# Patient Record
Sex: Female | Born: 1960
Health system: Southern US, Community
[De-identification: ages and names within clinical notes are randomized; demographics above are authoritative.]

## PROBLEM LIST (undated history)

## (undated) DIAGNOSIS — G540 Brachial plexus disorders: Secondary | ICD-10-CM

## (undated) DIAGNOSIS — N189 Chronic kidney disease, unspecified: Secondary | ICD-10-CM

## (undated) DIAGNOSIS — I10 Essential (primary) hypertension: Secondary | ICD-10-CM

## (undated) DIAGNOSIS — E669 Obesity, unspecified: Secondary | ICD-10-CM

## (undated) DIAGNOSIS — R609 Edema, unspecified: Secondary | ICD-10-CM

## (undated) DIAGNOSIS — Z0000 Encounter for general adult medical examination without abnormal findings: Secondary | ICD-10-CM

## (undated) DIAGNOSIS — J209 Acute bronchitis, unspecified: Secondary | ICD-10-CM

## (undated) DIAGNOSIS — F32A Depression, unspecified: Secondary | ICD-10-CM

## (undated) DIAGNOSIS — Z124 Encounter for screening for malignant neoplasm of cervix: Secondary | ICD-10-CM

## (undated) DIAGNOSIS — T7840XA Allergy, unspecified, initial encounter: Secondary | ICD-10-CM

## (undated) DIAGNOSIS — R0683 Snoring: Secondary | ICD-10-CM

## (undated) DIAGNOSIS — D649 Anemia, unspecified: Secondary | ICD-10-CM

## (undated) DIAGNOSIS — F329 Major depressive disorder, single episode, unspecified: Secondary | ICD-10-CM

## (undated) HISTORY — DX: Edema, unspecified: R60.9

## (undated) HISTORY — PX: TUBAL LIGATION: SHX77

## (undated) HISTORY — DX: Allergy, unspecified, initial encounter: T78.40XA

## (undated) HISTORY — DX: Anemia, unspecified: D64.9

## (undated) HISTORY — DX: Brachial plexus disorders: G54.0

## (undated) HISTORY — DX: Chronic kidney disease, unspecified: N18.9

## (undated) HISTORY — DX: Encounter for screening for malignant neoplasm of cervix: Z12.4

## (undated) HISTORY — DX: Snoring: R06.83

## (undated) HISTORY — DX: Obesity, unspecified: E66.9

## (undated) HISTORY — DX: Morbid (severe) obesity due to excess calories: E66.01

## (undated) HISTORY — DX: Encounter for general adult medical examination without abnormal findings: Z00.00

## (undated) HISTORY — DX: Acute bronchitis, unspecified: J20.9

---

## 2008-10-04 ENCOUNTER — Emergency Department (HOSPITAL_COMMUNITY): Admission: EM | Admit: 2008-10-04 | Discharge: 2008-10-04 | Payer: Self-pay | Admitting: Family Medicine

## 2008-10-06 ENCOUNTER — Emergency Department (HOSPITAL_COMMUNITY): Admission: EM | Admit: 2008-10-06 | Discharge: 2008-10-06 | Payer: Self-pay | Admitting: Family Medicine

## 2009-09-26 HISTORY — PX: ENDOMETRIAL ABLATION: SHX621

## 2012-01-24 ENCOUNTER — Emergency Department (INDEPENDENT_AMBULATORY_CARE_PROVIDER_SITE_OTHER): Payer: 59

## 2012-01-24 ENCOUNTER — Encounter (HOSPITAL_BASED_OUTPATIENT_CLINIC_OR_DEPARTMENT_OTHER): Payer: Self-pay | Admitting: *Deleted

## 2012-01-24 ENCOUNTER — Inpatient Hospital Stay (HOSPITAL_BASED_OUTPATIENT_CLINIC_OR_DEPARTMENT_OTHER)
Admission: EM | Admit: 2012-01-24 | Discharge: 2012-01-26 | DRG: 189 | Disposition: A | Discharge status: Home / Self Care | Payer: 59 | Attending: Internal Medicine | Admitting: Internal Medicine

## 2012-01-24 DIAGNOSIS — E876 Hypokalemia: Secondary | ICD-10-CM | POA: Diagnosis present

## 2012-01-24 DIAGNOSIS — R6510 Systemic inflammatory response syndrome (SIRS) of non-infectious origin without acute organ dysfunction: Secondary | ICD-10-CM | POA: Diagnosis present

## 2012-01-24 DIAGNOSIS — J9601 Acute respiratory failure with hypoxia: Secondary | ICD-10-CM | POA: Diagnosis present

## 2012-01-24 DIAGNOSIS — R0602 Shortness of breath: Secondary | ICD-10-CM

## 2012-01-24 DIAGNOSIS — F32A Depression, unspecified: Secondary | ICD-10-CM | POA: Diagnosis present

## 2012-01-24 DIAGNOSIS — J209 Acute bronchitis, unspecified: Secondary | ICD-10-CM | POA: Diagnosis present

## 2012-01-24 DIAGNOSIS — I10 Essential (primary) hypertension: Secondary | ICD-10-CM | POA: Diagnosis present

## 2012-01-24 DIAGNOSIS — J45901 Unspecified asthma with (acute) exacerbation: Secondary | ICD-10-CM | POA: Diagnosis present

## 2012-01-24 DIAGNOSIS — F329 Major depressive disorder, single episode, unspecified: Secondary | ICD-10-CM | POA: Diagnosis present

## 2012-01-24 DIAGNOSIS — F3289 Other specified depressive episodes: Secondary | ICD-10-CM | POA: Diagnosis present

## 2012-01-24 DIAGNOSIS — I498 Other specified cardiac arrhythmias: Secondary | ICD-10-CM | POA: Diagnosis present

## 2012-01-24 DIAGNOSIS — Z930 Tracheostomy status: Secondary | ICD-10-CM

## 2012-01-24 DIAGNOSIS — J45902 Unspecified asthma with status asthmaticus: Secondary | ICD-10-CM

## 2012-01-24 DIAGNOSIS — J9600 Acute respiratory failure, unspecified whether with hypoxia or hypercapnia: Principal | ICD-10-CM | POA: Diagnosis present

## 2012-01-24 DIAGNOSIS — D72829 Elevated white blood cell count, unspecified: Secondary | ICD-10-CM | POA: Diagnosis present

## 2012-01-24 DIAGNOSIS — J45909 Unspecified asthma, uncomplicated: Secondary | ICD-10-CM | POA: Diagnosis present

## 2012-01-24 DIAGNOSIS — R000 Tachycardia, unspecified: Secondary | ICD-10-CM | POA: Diagnosis present

## 2012-01-24 HISTORY — DX: Essential (primary) hypertension: I10

## 2012-01-24 HISTORY — DX: Depression, unspecified: F32.A

## 2012-01-24 HISTORY — DX: Major depressive disorder, single episode, unspecified: F32.9

## 2012-01-24 LAB — CBC
HCT: 40.1 % (ref 36.0–46.0)
MCH: 29.6 pg (ref 26.0–34.0)
MCH: 29.8 pg (ref 26.0–34.0)
MCV: 87.4 fL (ref 78.0–100.0)
Platelets: 357 10*3/uL (ref 150–400)
RBC: 4.56 MIL/uL (ref 3.87–5.11)
RDW: 12.4 % (ref 11.5–15.5)
RDW: 12.9 % (ref 11.5–15.5)
WBC: 22.7 10*3/uL — ABNORMAL HIGH (ref 4.0–10.5)

## 2012-01-24 LAB — PRO B NATRIURETIC PEPTIDE: Pro B Natriuretic peptide (BNP): 145.2 pg/mL — ABNORMAL HIGH (ref 0–125)

## 2012-01-24 LAB — BASIC METABOLIC PANEL
BUN: 15 mg/dL (ref 6–23)
CO2: 24 mEq/L (ref 19–32)
Calcium: 8.9 mg/dL (ref 8.4–10.5)
Calcium: 9.3 mg/dL (ref 8.4–10.5)
Chloride: 99 mEq/L (ref 96–112)
Chloride: 99 mEq/L (ref 96–112)
Creatinine, Ser: 0.8 mg/dL (ref 0.50–1.10)
GFR calc Af Amer: >90 mL/min (ref >90)
GFR calc non Af Amer: 86 mL/min — ABNORMAL LOW (ref >90)
Glucose, Bld: 142 mg/dL — ABNORMAL HIGH (ref 70–99)
Sodium: 136 mEq/L (ref 135–145)

## 2012-01-24 LAB — COMPREHENSIVE METABOLIC PANEL
AST: 21 U/L (ref 0–37)
Albumin: 3.5 g/dL (ref 3.5–5.2)
Alkaline Phosphatase: 126 U/L — ABNORMAL HIGH (ref 39–117)
BUN: 15 mg/dL (ref 6–23)
Potassium: 2.5 mEq/L — CL (ref 3.5–5.1)
Sodium: 138 mEq/L (ref 135–145)
Total Protein: 7.5 g/dL (ref 6.0–8.3)

## 2012-01-24 LAB — POCT I-STAT 3, ART BLOOD GAS (G3+)
Acid-base deficit: 2 mmol/L (ref 0.0–2.0)
Bicarbonate: 22.9 mEq/L (ref 20.0–24.0)
O2 Saturation: 89 %
pCO2 arterial: 40 mmHg (ref 35.0–45.0)
pO2, Arterial: 59 mmHg — ABNORMAL LOW (ref 80.0–100.0)

## 2012-01-24 LAB — DIFFERENTIAL
Basophils Absolute: 0 10*3/uL (ref 0.0–0.1)
Eosinophils Absolute: 1.1 10*3/uL — ABNORMAL HIGH (ref 0.0–0.7)
Eosinophils Relative: 7 % — ABNORMAL HIGH (ref 0–5)
Monocytes Absolute: 0.8 10*3/uL (ref 0.1–1.0)
Neutrophils Relative %: 70 % (ref 43–77)

## 2012-01-24 LAB — GLUCOSE, CAPILLARY
Glucose-Capillary: 144 mg/dL — ABNORMAL HIGH (ref 70–99)
Glucose-Capillary: 138 mg/dL — ABNORMAL HIGH (ref 70–99)

## 2012-01-24 LAB — D-DIMER, QUANTITATIVE: D-Dimer, Quant: <0.22 ug/mL-FEU (ref 0.00–0.48)

## 2012-01-24 MED ORDER — ENOXAPARIN SODIUM 40 MG/0.4ML ~~LOC~~ SOLN
40.0000 mg | SUBCUTANEOUS | Status: DC
  Administered 2012-01-24 – 2012-01-26 (×3): 40 mg via SUBCUTANEOUS
  Filled 2012-01-24 (×3): qty 0.4

## 2012-01-24 MED ORDER — DEXTROSE 5 % IV SOLN
500.0000 mg | INTRAVENOUS | Status: DC
  Administered 2012-01-24 – 2012-01-26 (×3): 500 mg via INTRAVENOUS
  Filled 2012-01-24 (×4): qty 500

## 2012-01-24 MED ORDER — IPRATROPIUM BROMIDE 0.02 % IN SOLN
0.5000 mg | Freq: Once | RESPIRATORY_TRACT | Status: AC
  Administered 2012-01-24: 0.5 mg via RESPIRATORY_TRACT

## 2012-01-24 MED ORDER — ONDANSETRON HCL 4 MG PO TABS: 4.0000 mg | ORAL_TABLET | Freq: Four times a day (QID) | ORAL | Status: DC | PRN

## 2012-01-24 MED ORDER — SODIUM CHLORIDE 0.9 % IJ SOLN
3.0000 mL | Freq: Two times a day (BID) | INTRAMUSCULAR | Status: DC
  Administered 2012-01-26: 3 mL via INTRAVENOUS

## 2012-01-24 MED ORDER — ESCITALOPRAM OXALATE 20 MG PO TABS
20.0000 mg | ORAL_TABLET | Freq: Every day | ORAL | Status: DC
  Administered 2012-01-24 – 2012-01-26 (×3): 20 mg via ORAL
  Filled 2012-01-24 (×3): qty 1

## 2012-01-24 MED ORDER — ACETAMINOPHEN 650 MG RE SUPP: 650.0000 mg | Freq: Four times a day (QID) | RECTAL | Status: DC | PRN

## 2012-01-24 MED ORDER — POTASSIUM CHLORIDE CRYS ER 20 MEQ PO TBCR
40.0000 meq | EXTENDED_RELEASE_TABLET | Freq: Two times a day (BID) | ORAL | Status: AC
  Administered 2012-01-24 (×2): 40 meq via ORAL
  Filled 2012-01-24 (×2): qty 2

## 2012-01-24 MED ORDER — ONDANSETRON HCL 4 MG/2ML IJ SOLN: 4.0000 mg | Freq: Four times a day (QID) | INTRAMUSCULAR | Status: DC | PRN

## 2012-01-24 MED ORDER — ALBUTEROL SULFATE (5 MG/ML) 0.5% IN NEBU
INHALATION_SOLUTION | RESPIRATORY_TRACT | Status: AC
  Filled 2012-01-24: qty 1

## 2012-01-24 MED ORDER — TRIAMTERENE-HCTZ 37.5-25 MG PO CAPS
1.0000 | ORAL_CAPSULE | ORAL | Status: DC
  Administered 2012-01-24 – 2012-01-26 (×3): 1 via ORAL
  Filled 2012-01-24 (×5): qty 1

## 2012-01-24 MED ORDER — BIOTENE DRY MOUTH MT LIQD
15.0000 mL | Freq: Two times a day (BID) | OROMUCOSAL | Status: DC
  Administered 2012-01-24 – 2012-01-26 (×4): 15 mL via OROMUCOSAL

## 2012-01-24 MED ORDER — ALBUTEROL SULFATE (5 MG/ML) 0.5% IN NEBU
INHALATION_SOLUTION | RESPIRATORY_TRACT | Status: AC
  Filled 2012-01-24: qty 0.5

## 2012-01-24 MED ORDER — SODIUM CHLORIDE 0.9 % IJ SOLN
3.0000 mL | Freq: Two times a day (BID) | INTRAMUSCULAR | Status: DC
  Administered 2012-01-24 – 2012-01-26 (×4): 3 mL via INTRAVENOUS

## 2012-01-24 MED ORDER — BUDESONIDE 0.5 MG/2ML IN SUSP
0.5000 mg | Freq: Two times a day (BID) | RESPIRATORY_TRACT | Status: DC
  Administered 2012-01-24 – 2012-01-25 (×2): 0.5 mg via RESPIRATORY_TRACT
  Filled 2012-01-24 (×5): qty 2

## 2012-01-24 MED ORDER — LEVALBUTEROL HCL 0.63 MG/3ML IN NEBU
0.6300 mg | INHALATION_SOLUTION | Freq: Four times a day (QID) | RESPIRATORY_TRACT | Status: DC | PRN
  Filled 2012-01-24: qty 3

## 2012-01-24 MED ORDER — METHYLPREDNISOLONE SODIUM SUCC 125 MG IJ SOLR
60.0000 mg | Freq: Four times a day (QID) | INTRAMUSCULAR | Status: DC
  Administered 2012-01-24 (×3): 60 mg via INTRAVENOUS
  Administered 2012-01-25: 07:00:00 via INTRAVENOUS
  Administered 2012-01-25: 60 mg via INTRAVENOUS
  Filled 2012-01-24: qty 0.96
  Filled 2012-01-24: qty 2
  Filled 2012-01-24 (×3): qty 0.96
  Filled 2012-01-24: qty 2
  Filled 2012-01-24: qty 0.96

## 2012-01-24 MED ORDER — ALBUTEROL SULFATE (5 MG/ML) 0.5% IN NEBU
5.0000 mg | INHALATION_SOLUTION | Freq: Once | RESPIRATORY_TRACT | Status: AC
  Administered 2012-01-24: 5 mg via RESPIRATORY_TRACT

## 2012-01-24 MED ORDER — ALBUTEROL (5 MG/ML) CONTINUOUS INHALATION SOLN
10.0000 mg/h | INHALATION_SOLUTION | RESPIRATORY_TRACT | Status: AC
  Administered 2012-01-24: 10 mg/h via RESPIRATORY_TRACT
  Filled 2012-01-24: qty 20

## 2012-01-24 MED ORDER — LEVALBUTEROL HCL 0.63 MG/3ML IN NEBU
0.6300 mg | INHALATION_SOLUTION | Freq: Four times a day (QID) | RESPIRATORY_TRACT | Status: DC
  Administered 2012-01-24 – 2012-01-26 (×9): 0.63 mg via RESPIRATORY_TRACT
  Filled 2012-01-24 (×15): qty 3

## 2012-01-24 MED ORDER — LORATADINE 10 MG PO TABS
10.0000 mg | ORAL_TABLET | Freq: Every day | ORAL | Status: DC
  Administered 2012-01-24 – 2012-01-26 (×3): 10 mg via ORAL
  Filled 2012-01-24 (×3): qty 1

## 2012-01-24 MED ORDER — IPRATROPIUM BROMIDE 0.02 % IN SOLN
RESPIRATORY_TRACT | Status: AC
  Administered 2012-01-24: 0.5 mg via RESPIRATORY_TRACT
  Filled 2012-01-24: qty 2.5

## 2012-01-24 MED ORDER — PANTOPRAZOLE SODIUM 40 MG IV SOLR
40.0000 mg | INTRAVENOUS | Status: DC
  Administered 2012-01-24: 40 mg via INTRAVENOUS
  Filled 2012-01-24 (×2): qty 40

## 2012-01-24 MED ORDER — IPRATROPIUM BROMIDE 0.02 % IN SOLN
0.5000 mg | Freq: Once | RESPIRATORY_TRACT | Status: AC
  Administered 2012-01-24: 0.5 mg via RESPIRATORY_TRACT
  Filled 2012-01-24: qty 2.5

## 2012-01-24 MED ORDER — PREDNISONE 50 MG PO TABS
60.0000 mg | ORAL_TABLET | Freq: Once | ORAL | Status: AC
  Administered 2012-01-24: 60 mg via ORAL
  Filled 2012-01-24: qty 1

## 2012-01-24 MED ORDER — INSULIN ASPART 100 UNIT/ML ~~LOC~~ SOLN
0.0000 [IU] | SUBCUTANEOUS | Status: DC
  Administered 2012-01-24: 1 [IU] via SUBCUTANEOUS
  Administered 2012-01-24: 2 [IU] via SUBCUTANEOUS

## 2012-01-24 MED ORDER — ALBUTEROL SULFATE (5 MG/ML) 0.5% IN NEBU
5.0000 mg | INHALATION_SOLUTION | Freq: Once | RESPIRATORY_TRACT | Status: AC
  Administered 2012-01-24: 5 mg via RESPIRATORY_TRACT
  Filled 2012-01-24: qty 1

## 2012-01-24 MED ORDER — INSULIN ASPART 100 UNIT/ML ~~LOC~~ SOLN
0.0000 [IU] | Freq: Three times a day (TID) | SUBCUTANEOUS | Status: DC
  Administered 2012-01-25 (×3): 1 [IU] via SUBCUTANEOUS
  Administered 2012-01-26: 2 [IU] via SUBCUTANEOUS

## 2012-01-24 MED ORDER — ACETAMINOPHEN 325 MG PO TABS: 650.0000 mg | ORAL_TABLET | Freq: Four times a day (QID) | ORAL | Status: DC | PRN

## 2012-01-24 MED ORDER — IBUPROFEN 800 MG PO TABS
800.0000 mg | ORAL_TABLET | Freq: Once | ORAL | Status: AC
  Administered 2012-01-24: 800 mg via ORAL
  Filled 2012-01-24: qty 1

## 2012-01-24 NOTE — Progress Notes (Signed)
Utilization Review Completed.Kielan Dreisbach T4/30/2013   

## 2012-01-24 NOTE — ED Notes (Signed)
C/o SOB for few days with cough, audible wheezing in triage

## 2012-01-24 NOTE — ED Provider Notes (Signed)
History     CSN: [id number]  Arrival date & time 01/24/12  0214   First MD Initiated Contact with Patient 01/24/12 [phone]      Chief Complaint  Patient presents with  . Shortness of Breath     Patient is a 51 y.o. female presenting with shortness of breath. The history is provided by the patient. The history is limited by the condition of the patient.  Shortness of Breath  The current episode started 2 days ago. The problem occurs continuously. The problem has been rapidly worsening. The problem is severe. The symptoms are relieved by nothing. The symptoms are aggravated by activity. Associated symptoms include cough, shortness of breath and wheezing. Pertinent negatives include no chest pain.  Pt reports cough/wheeze shortness of breath for about 2 days She does not smoke She has been told previously that she has bronchitis  Past Medical History  Diagnosis Date  . Asthma   . Hypertension      Family History - negative for asthma, positive for CAD  History  Substance Use Topics  . Smoking status: Never Smoker   . Smokeless tobacco: Not on file  . Alcohol Use: No    OB History    Grav Para Term Preterm Abortions TAB SAB Ect Mult Living                  Review of Systems  Unable to perform ROS: Unstable vital signs  Respiratory: Positive for cough, shortness of breath and wheezing.   Cardiovascular: Negative for chest pain.    Allergies  Review of patient's allergies indicates not on file.  Home Medications  No current outpatient prescriptions on file.  BP 165/65  Pulse 114  Temp(Src) 97.7 F (36.5 C) (Oral)  Resp 28  SpO2 93%  Physical Exam CONSTITUTIONAL: ill appearing, she is in distress HEAD AND FACE: Normocephalic/atraumatic EYES: EOMI/PERRL ENMT: Mucous membranes moist NECK: supple no meningeal signs SPINE:entire spine nontender CV: S1/S2 noted, no murmurs/rubs/gallops noted LUNGS: tachypneic, she is only able to speak in short sentences,  diffuse wheezing noted bilaterally ABDOMEN: soft, nontender, no rebound or guarding, obese GU:no cva tenderness NEURO: Pt is awake/alert, moves all extremitiesx4 EXTREMITIES: pulses normal, full ROM, no edema SKIN: warm, color normal PSYCH: anxious ED Course  Procedures   CRITICAL CARE Performed by: Joya Gaskins   Total critical care time: 45  Critical care time was exclusive of separately billable procedures and treating other patients.  Critical care was necessary to treat or prevent imminent or life-threatening deterioration.  Critical care was time spent personally by me on the following activities: development of treatment plan with patient and/or surrogate as well as nursing, discussions with consultants, evaluation of patient's response to treatment, examination of patient, obtaining history from patient or surrogate, ordering and performing treatments and interventions, ordering and review of laboratory studies, ordering and review of radiographic studies, pulse oximetry and re-evaluation of patient's condition.    Labs Reviewed  CBC  DIFFERENTIAL  BASIC METABOLIC PANEL  2:29 AM Pt seen on arrival for SOB - she is in distress, hypoxic to the 80s, oxygen applied and given nebulizers Her hypoxia has improved Will follow closely 2:41 AM Her work of breathing has improved, her hypoxia has improved Will continue to follow 2:58 AM Pt improved She reports several days of cough/wheeze/shortness of breath No cp, no syncope.  She denies h/o CAD/CHF She reports once or twice/yr she will have cough/wheeze episodes but this is the worst she has  had She reports home albuterol inhaler did not help symptoms Will continue to monitor 3:23 AM RA pulse ox >90, pt much improved but still wheezing Will place on CDU protocol for bronchospasm (pt reports h/o similar episodes previously but this is worse, doubt PE/ACS at this time) 4:42 AM Pt not significantly improved Her peak  flow is very low She is awake/alert, conversant but still SOB Will need admission 4:56 AM D/w dr Toniann Fail will admit to stepdown at [facility] 5:09 AM Pt more comfortable, reports she feels improved, her work of breathing has improved 5:52 AM ABG reviewed Pt awake/alert, appears comfortable, reports she is improved, tachycardic (130s) though appears narrow likely response to nebs.  Stable for transport to stepdown   MDM  Nursing notes reviewed and considered in documentation xrays reviewed and considered  labs/vitals reviewed and considered        Date: 01/24/2012  Rate: 116  Rhythm: sinus tachycardia  QRS Axis: normal  Intervals: normal  ST/T Wave abnormalities: nonspecific ST changes  Conduction Disutrbances:none  Narrative Interpretation:   Old EKG Reviewed: none available at time of interpretation    Joya Gaskins, MD 01/24/12 [phone]

## 2012-01-24 NOTE — ED Notes (Addendum)
Pt states that she has bronchitis but no hx of asthma and is morbidly obese. Pt presented to the ED with [facility] which she states she used her MDI Albuterol 2 puff prior to arrival. Pt is diminished bilaterally with some upper airway wheezing in her neck.

## 2012-01-24 NOTE — Plan of Care (Signed)
Problem: Phase II Progression Outcomes Goal: Dyspnea controlled w/progressive activity Outcome: Progressing Pt tolerating out of bed to bedside commode

## 2012-01-24 NOTE — Progress Notes (Signed)
[facility]   Subjective: She was evaluated in the AM. She was still c/o of dyspnea but much improved. Her HR is noted to be in 130s - she tells me she had a 30 min neb in addition to 3 other neb treatments. She recently had a bronchitis and was treated with antibiotics. She thought she had improved but after the nebs were given, she began to cough up green sputum again.   Objective: Blood pressure 129/52, pulse 108, temperature 98 F (36.7 C), temperature source Oral, resp. rate 15, SpO2 95.00%. Weight change:   Intake/Output Summary (Last 24 hours) at 01/24/12 1628 Last data filed at 01/24/12 1555  Gross per 24 hour  Intake    980 ml  Output    525 ml  Net    455 ml    Physical Exam: General appearance: morbidly obese, alert, cooperative and mild distress Lungs: tachypneic, wheezing and ronchi present.  Heart: regular rate and rhythm, S1, S2 normal, tachycardic Abdomen: soft, non-tender; bowel sounds normal; no masses,  no organomegaly Extremities: extremities normal, atraumatic, no cyanosis or edema  Lab Results:  [facility] 01/24/12 0829 01/24/12 0243  NA 138 137  K 2.5* 3.4*  CL 96 99  CO2 19 27  GLUCOSE 160* 113*  BUN 15 15  CREATININE 0.77 0.80  CALCIUM 8.8 8.9  MG -- --  PHOS -- --    Basename 01/24/12 0829  AST 21  ALT 23  ALKPHOS 126*  BILITOT 0.3  PROT 7.5  ALBUMIN 3.5   No results found for this basename: LIPASE:2,AMYLASE:2 in the last 72 hours  Basename 01/24/12 0829 01/24/12 0243  WBC 22.7* 15.4*  NEUTROABS -- 10.7*  HGB 13.7 13.5  HCT 40.1 39.5  MCV 87.4 86.6  PLT 403* 357   No results found for this basename: CKTOTAL:3,CKMB:3,CKMBINDEX:3,TROPONINI:3 in the last 72 hours No components found with this basename: POCBNP:3  Basename 01/24/12 0829  DDIMER <0.22   No results found for this basename: HGBA1C:2 in the last 72 hours No results found for this basename: CHOL:2,HDL:2,LDLCALC:2,TRIG:2,CHOLHDL:2,LDLDIRECT:2 in the last 72  hours No results found for this basename: TSH,T4TOTAL,FREET3,T3FREE,THYROIDAB in the last 72 hours No results found for this basename: VITAMINB12:2,FOLATE:2,FERRITIN:2,TIBC:2,IRON:2,RETICCTPCT:2 in the last 72 hours  Micro Results: Recent Results (from the past 240 hour(s))  MRSA PCR SCREENING     Status: Normal   Collection Time   01/24/12  6:43 AM      Component Value Range Status Comment   MRSA by PCR NEGATIVE  NEGATIVE  Final     Studies/Results: Dg Chest Port 1 View  01/24/2012  *RADIOLOGY REPORT*  Clinical Data: Shortness of breath  PORTABLE CHEST - 1 VIEW  Comparison: None.  Findings: Degraded by patient body habitus/portable technique.  No focal consolidation.  No pleural effusion or pneumothorax. Cardiomediastinal contours within normal limits.  No acute osseous finding identified.  IMPRESSION: No definite acute process.  Original Report Authenticated By: Waneta Martins, M.D.    Medications: Scheduled Meds:   . albuterol  5 mg Nebulization Once  . albuterol  5 mg Nebulization Once  . albuterol  5 mg Nebulization Once  . albuterol      . antiseptic oral rinse  15 mL Mouth Rinse BID  . azithromycin  500 mg Intravenous Q24H  . budesonide  0.5 mg Nebulization BID  . enoxaparin  40 mg Subcutaneous Q24H  . escitalopram  20 mg Oral Daily  . ibuprofen  800 mg Oral Once  . insulin  aspart  0-9 Units Subcutaneous Q4H  . ipratropium  0.5 mg Nebulization Once  . ipratropium  0.5 mg Nebulization Once  . levalbuterol  0.63 mg Nebulization Q6H  . loratadine  10 mg Oral Daily  . methylPREDNISolone (SOLU-MEDROL) injection  60 mg Intravenous Q6H  . pantoprazole (PROTONIX) IV  40 mg Intravenous Q24H  . potassium chloride  40 mEq Oral BID  . predniSONE  60 mg Oral Once  . sodium chloride  3 mL Intravenous Q12H  . sodium chloride  3 mL Intravenous Q12H  . triamterene-hydrochlorothiazide  1 each Oral BH-q7a   Continuous Infusions:   . albuterol 10 mg/hr (01/24/12 0407)   PRN  Meds:.acetaminophen, acetaminophen, levalbuterol, ondansetron (ZOFRAN) IV, ondansetron, DISCONTD: ondansetron  Assessment/Plan: Principal Problem:  Acute respiratory failure - agree with antibiotics, steroids, nebs and step down status.   Active Problems:  HTN (hypertension) Controlled   Depression Cont escitalopram   LOS: 0 days   [facility] [place] [phone] 01/24/2012, 4:28 PM

## 2012-01-24 NOTE — ED Notes (Signed)
Report given to RN Tammy, carelink given report, pt informed of room number and cooperative to transfer, pt currently resting with no needs at this time.

## 2012-01-24 NOTE — ED Notes (Signed)
Patient denies pain and is resting comfortably.  

## 2012-01-24 NOTE — H&P (Signed)
Pamela Solis is an 51 y.o. female.   PCP - [facility]. Chief Complaint: Shortness of breath. HPI: 51 year-old female with history of hypertension, allergic rhinitis, depression presented to the ER at [facility] with complaints of persistent wheezing and shortness of breath. Patient states she's been feeling short of breath lasting for 4 days and over the last 24 hours got worse and she decided to come to the ER. In the ER chest x-ray did not show anything acute. Patient was found to be wheezing bilaterally and patient was given nebulizer. Despite giving multiple doses patient was still wheezing. The patient has been admitted for further management of her asthma. Patient states she has never been diagnosed with asthma previous. She has had recent bronchitis in the beginning of this month when she was prescribed albuterol. She takes that whenever needed. She denies any chest pain cough fever chills nausea vomiting abdominal pain.  Past Medical History  Diagnosis Date  . Asthma   . Hypertension   . Depression     Past Surgical History  Procedure Date  . Cesarean section     History reviewed. No pertinent family history. Social History:  reports that she has never smoked. She does not have any smokeless tobacco history on file. She reports that she does not drink alcohol or use illicit drugs.  Allergies:  Allergies  Allergen Reactions  . Penicillins     Medications Prior to Admission  Medication Sig Dispense Refill  . albuterol (PROVENTIL HFA;VENTOLIN HFA) 108 (90 BASE) MCG/ACT inhaler Inhale 2 puffs into the lungs every 6 (six) hours as needed.      . cetirizine (ZYRTEC) 10 MG tablet Take 10 mg by mouth daily.      Marland Kitchen escitalopram (LEXAPRO) 20 MG tablet Take 20 mg by mouth daily.      Marland Kitchen triamterene-hydrochlorothiazide (DYAZIDE) 37.5-25 MG per capsule Take 1 capsule by mouth every morning.        Results for orders placed during the hospital encounter of  01/24/12 (from the past 48 hour(s))  CBC     Status: Abnormal   Collection Time   01/24/12  2:43 AM      Component Value Range Comment   WBC 15.4 (*) 4.0 - 10.5 (K/uL)    RBC 4.56  3.87 - 5.11 (MIL/uL)    Hemoglobin 13.5  12.0 - 15.0 (g/dL)    HCT 16.1  09.6 - 04.5 (%)    MCV 86.6  78.0 - 100.0 (fL)    MCH 29.6  26.0 - 34.0 (pg)    MCHC 34.2  30.0 - 36.0 (g/dL)    RDW 40.9  81.1 - 91.4 (%)    Platelets 357  150 - 400 (K/uL)   DIFFERENTIAL     Status: Abnormal   Collection Time   01/24/12  2:43 AM      Component Value Range Comment   Neutrophils Relative 70  43 - 77 (%)    Lymphocytes Relative 18  12 - 46 (%)    Monocytes Relative 5  3 - 12 (%)    Eosinophils Relative 7 (*) 0 - 5 (%)    Basophils Relative 0  0 - 1 (%)    Neutro Abs 10.7 (*) 1.7 - 7.7 (K/uL)    Lymphs Abs 2.8  0.7 - 4.0 (K/uL)    Monocytes Absolute 0.8  0.1 - 1.0 (K/uL)    Eosinophils Absolute 1.1 (*) 0.0 - 0.7 (K/uL)  Basophils Absolute 0.0  0.0 - 0.1 (K/uL)    WBC Morphology WHITE COUNT CONFIRMED ON SMEAR      Smear Review PLATELET COUNT CONFIRMED BY SMEAR   MORPHOLOGY UNREMARKABLE  BASIC METABOLIC PANEL     Status: Abnormal   Collection Time   01/24/12  2:43 AM      Component Value Range Comment   Sodium 137  135 - 145 (mEq/L)    Potassium 3.4 (*) 3.5 - 5.1 (mEq/L)    Chloride 99  96 - 112 (mEq/L)    CO2 27  19 - 32 (mEq/L)    Glucose, Bld 113 (*) 70 - 99 (mg/dL)    BUN 15  6 - 23 (mg/dL)    Creatinine, Ser 1.47  0.50 - 1.10 (mg/dL)    Calcium 8.9  8.4 - 10.5 (mg/dL)    GFR calc non Af Amer 86 (*) >90 (mL/min)    GFR calc Af Amer >90  >90 (mL/min)   POCT I-STAT 3, BLOOD GAS (G3+)     Status: Abnormal   Collection Time   01/24/12  5:38 AM      Component Value Range Comment   pH, Arterial 7.367  7.350 - 7.400     pCO2 arterial 40.0  35.0 - 45.0 (mmHg)    pO2, Arterial 59.0 (*) 80.0 - 100.0 (mmHg)    Bicarbonate 22.9  20.0 - 24.0 (mEq/L)    TCO2 24  0 - 100 (mmol/L)    O2 Saturation 89.0       Acid-base deficit 2.0  0.0 - 2.0 (mmol/L)    Patient temperature 98.7 F      Collection site RADIAL, ALLEN'S TEST ACCEPTABLE      Drawn by RT      Sample type ARTERIAL      Dg Chest Port 1 View  01/24/2012  *RADIOLOGY REPORT*  Clinical Data: Shortness of breath  PORTABLE CHEST - 1 VIEW  Comparison: None.  Findings: Degraded by patient body habitus/portable technique.  No focal consolidation.  No pleural effusion or pneumothorax. Cardiomediastinal contours within normal limits.  No acute osseous finding identified.  IMPRESSION: No definite acute process.  Original Report Authenticated By: Waneta Martins, M.D.    Review of Systems  Constitutional: Negative.   HENT: Negative.   Eyes: Negative.   Respiratory: Positive for cough and shortness of breath.   Cardiovascular: Negative.   Gastrointestinal: Negative.   Genitourinary: Negative.   Musculoskeletal: Negative.   Skin: Negative.   Neurological: Negative.   Endo/Heme/Allergies: Negative.   Psychiatric/Behavioral: Negative.     Blood pressure 164/71, pulse 148, temperature 98 F (36.7 C), temperature source Oral, resp. rate 22, SpO2 100.00%. Physical Exam  Constitutional: She is oriented to person, place, and time. She appears well-developed and well-nourished. No distress.  HENT:  Head: Normocephalic and atraumatic.  Right Ear: External ear normal.  Left Ear: External ear normal.  Nose: Nose normal.  Mouth/Throat: Oropharynx is clear and moist. No oropharyngeal exudate.  Eyes: Conjunctivae are normal. Pupils are equal, round, and reactive to light. Right eye exhibits no discharge. Left eye exhibits no discharge. No scleral icterus.  Neck: Normal range of motion. Neck supple.  Cardiovascular:       Sinus tachycardia.  Respiratory: Effort normal. She has wheezes. She has no rales.       Mild respiratory distress.  GI: Soft. Bowel sounds are normal. She exhibits no distension. There is no tenderness. There is no rebound.    Musculoskeletal:  Normal range of motion. She exhibits no edema and no tenderness.  Neurological: She is alert and oriented to person, place, and time.       Moves all extremities.  Skin: Skin is warm and dry. No rash noted. She is not diaphoretic. No erythema.  Psychiatric: Her behavior is normal.     Assessment/Plan #1. Acute exacerbation of bronchial asthma - continue nebulizer. Will place patient on Xopenex due to significant tachycardia. Add Pulmicort and Solu-Medrol IV. Check BNP and d-dimer. #2. History of hypertension - continue her present medications. #3. History of depression - continue this medication.  CODE STATUS - full code.  Jameire Kouba N. 01/24/2012, 7:14 AM

## 2012-01-24 NOTE — Plan of Care (Signed)
Problem: Phase I Progression Outcomes Goal: Progress activity as tolerated unless otherwise ordered Outcome: Progressing Pt out of bed to bedside commode, tolerating well

## 2012-01-24 NOTE — ED Notes (Signed)
Pt was given a third HHN tx then pt attempted to try the peak flow measurements before the tx which she truly wasn't able to give good effort due to some SHOB. Pt was then tried post HHN tx which she couldn't perform well with adequeate measurements due to productive cough and tachypnea.

## 2012-01-24 NOTE — ED Notes (Signed)
meds given by Helmut Muster, RN

## 2012-01-24 NOTE — ED Notes (Signed)
Pt placed on monitor, HR 140-150, pt asymptomatic, rate and rhythm regular, bp 149/74, md aware

## 2012-01-24 NOTE — Progress Notes (Addendum)
Potassium 2.5 from 01/24/12 0839 lab results. MD paged, awaiting orders.

## 2012-01-25 DIAGNOSIS — J45901 Unspecified asthma with (acute) exacerbation: Secondary | ICD-10-CM

## 2012-01-25 DIAGNOSIS — F329 Major depressive disorder, single episode, unspecified: Secondary | ICD-10-CM

## 2012-01-25 DIAGNOSIS — I10 Essential (primary) hypertension: Secondary | ICD-10-CM

## 2012-01-25 DIAGNOSIS — R7989 Other specified abnormal findings of blood chemistry: Secondary | ICD-10-CM

## 2012-01-25 LAB — GLUCOSE, CAPILLARY
Glucose-Capillary: 145 mg/dL — ABNORMAL HIGH (ref 70–99)
Glucose-Capillary: 121 mg/dL — ABNORMAL HIGH (ref 70–99)
Glucose-Capillary: 119 mg/dL — ABNORMAL HIGH (ref 70–99)

## 2012-01-25 MED ORDER — METHYLPREDNISOLONE SODIUM SUCC 125 MG IJ SOLR
60.0000 mg | Freq: Two times a day (BID) | INTRAMUSCULAR | Status: DC
  Administered 2012-01-25: 60 mg via INTRAVENOUS
  Filled 2012-01-25 (×2): qty 0.96

## 2012-01-25 MED ORDER — PANTOPRAZOLE SODIUM 40 MG PO TBEC: 40.0000 mg | DELAYED_RELEASE_TABLET | Freq: Every day | ORAL | Status: DC

## 2012-01-25 NOTE — Progress Notes (Signed)
Pt ambulated to BR to take a shower. After pt showered she became SOB while drying off and putting on her clothes. Pt returned to chair and O2 sats were obtained. Pt was 93-94% on RA, HR 90s-100s (SR-ST). Pt placed on 1L o2 via [place] to help with the feeling of SOB. Pt sats increased to 95-96%. Will continue to monitor patient closely. Ramond Craver, Rn

## 2012-01-25 NOTE — Progress Notes (Signed)
MEDICATION RELATED CONSULT NOTE - INITIAL   PHARMACIST - PHYSICIAN COMMUNICATION  CONCERNING: IV to Oral Route Change Policy  RECOMMENDATION:  This patient is receiving Protonix by the intravenous route. Based on criteria approved by the Pharmacy and Therapeutics Committee, this drug is being converted to the equivalent oral dose form(s).   DESCRIPTION:  These criteria include:  The patient is eating (either orally or via tube) and/or has been taking other orally administered medications for a least 24 hours There is no active GI bleed or impaired GI absorption noted.  If you have questions about this conversion, please contact the Pharmacy Department   Cleon Dew, PharmD [phone] 01/25/2012, 1:51 PM

## 2012-01-25 NOTE — Progress Notes (Signed)
Pt ambulated the whole length of hallway and back to room (712ft). Pt sats remained 93-96% on RA. HR remained upper 90s to 1teens. Pt denies SOB with ambulation. Will continue to monitor patient closely. Ramond Craver, RN

## 2012-01-25 NOTE — Progress Notes (Signed)
[facility] [place] TEAM 1 - Stepdown/ICU TEAM  Subjective: 51 year-old female with history of hypertension, allergic rhinitis, depression presented to the ER at [facility] with complaints of persistent wheezing and shortness of breath.  No further audible wheezing - denies current shortness of breath or chest pain. Has not yet ambulated.  Objective: Blood pressure 111/66, pulse 86, temperature 97.7 F (36.5 C), temperature source Oral, resp. rate 18, height 5\' 6"  (1.676 m), weight 133.8 kg (294 lb 15.6 oz), SpO2 90.00%. Weight change:   Intake/Output Summary (Last 24 hours) at 01/25/12 1130 Last data filed at 01/25/12 1044  Gross per 24 hour  Intake   1093 ml  Output   1000 ml  Net     93 ml   Physical Exam: General appearance: obese, alert, cooperative and mild distress Lungs: Bilateral lung sounds are clear to auscultation no further wheezing Heart: regular rate and rhythm, S1, S2 normal, tachycardic Abdomen: soft, non-tender; bowel sounds normal; no masses,  no organomegaly Extremities: extremities normal, atraumatic, no cyanosis or edema  Lab Results:  [facility] 01/24/12 2146 01/24/12 0829  NA 136 138  K 3.4* 2.5*  CL 99 96  CO2 24 19  GLUCOSE 142* 160*  BUN 13 15  CREATININE 0.95 0.77  CALCIUM 9.3 8.8  MG 2.0 --  PHOS -- --    Basename 01/24/12 0829  AST 21  ALT 23  ALKPHOS 126*  BILITOT 0.3  PROT 7.5  ALBUMIN 3.5    Basename 01/24/12 0829 01/24/12 0243  WBC 22.7* 15.4*  NEUTROABS -- 10.7*  HGB 13.7 13.5  HCT 40.1 39.5  MCV 87.4 86.6  PLT 403* 357    Basename 01/24/12 0829  DDIMER <0.22   Micro Results: Recent Results (from the past 240 hour(s))  MRSA PCR SCREENING     Status: Normal   Collection Time   01/24/12  6:43 AM      Component Value Range Status Comment   MRSA by PCR NEGATIVE  NEGATIVE  Final     Studies/Results: Dg Chest Port 1 View  01/24/2012  *RADIOLOGY REPORT*  Clinical Data: Shortness of breath  PORTABLE  CHEST - 1 VIEW  Comparison: None.  Findings: Degraded by patient body habitus/portable technique.  No focal consolidation.  No pleural effusion or pneumothorax. Cardiomediastinal contours within normal limits.  No acute osseous finding identified.  IMPRESSION: No definite acute process.  Original Report Authenticated By: Waneta Martins, M.D.   Assessment/Plan:  Acute respiratory failure secondary to bronchitis and possible new diagnosis of asthma *Much more stable today as compared to yesterday *Will taper steroids, continue scheduled Xopenex nebs and likely will transition to meter dose inhalers on 01/26/2012, with plan to continue on schedule for 14 days after d/c, then transition to prn only with preventative steroid MDI once off oral steroids *Since on systemic steroids no indication at this time for Pulmicort nebs *Plan on mobilizing and monitor for recurrent symptoms  HTN (hypertension) *Controlled  Depression *Cont escitalopram  Hyperglycemia Due to systemic steroids - will need to be followed up in outpt in outpt setting to assure resolves  Disposition *Transfer to telemetry unit *Likely if remains stable can discharge on 01/26/2012   LOS: 1 day   ELLIS,ALLISON L. [phone] 01/25/2012, 11:30 AM  I have personally examined this patient and reviewed the entire database. I have reviewed the above note, made any necessary editorial changes, and agree with its content.  Lonia Blood, MD [facility]

## 2012-01-26 DIAGNOSIS — J9600 Acute respiratory failure, unspecified whether with hypoxia or hypercapnia: Secondary | ICD-10-CM

## 2012-01-26 DIAGNOSIS — E876 Hypokalemia: Secondary | ICD-10-CM | POA: Diagnosis present

## 2012-01-26 DIAGNOSIS — Z930 Tracheostomy status: Secondary | ICD-10-CM

## 2012-01-26 DIAGNOSIS — R6510 Systemic inflammatory response syndrome (SIRS) of non-infectious origin without acute organ dysfunction: Secondary | ICD-10-CM | POA: Diagnosis present

## 2012-01-26 DIAGNOSIS — J209 Acute bronchitis, unspecified: Secondary | ICD-10-CM

## 2012-01-26 DIAGNOSIS — D72829 Elevated white blood cell count, unspecified: Secondary | ICD-10-CM | POA: Diagnosis present

## 2012-01-26 DIAGNOSIS — I10 Essential (primary) hypertension: Secondary | ICD-10-CM

## 2012-01-26 LAB — BASIC METABOLIC PANEL
CO2: 28 mEq/L (ref 19–32)
Chloride: 100 mEq/L (ref 96–112)
Creatinine, Ser: 0.79 mg/dL (ref 0.50–1.10)
Potassium: 4.1 mEq/L (ref 3.5–5.1)

## 2012-01-26 LAB — GLUCOSE, CAPILLARY: Glucose-Capillary: 78 mg/dL (ref 70–99)

## 2012-01-26 LAB — HEMOGLOBIN A1C
Hgb A1c MFr Bld: 5.7 % — ABNORMAL HIGH (ref <5.7)
Mean Plasma Glucose: 117 mg/dL — ABNORMAL HIGH (ref <117)

## 2012-01-26 MED ORDER — ALBUTEROL SULFATE HFA 108 (90 BASE) MCG/ACT IN AERS: 2.0000 | INHALATION_SPRAY | Freq: Four times a day (QID) | RESPIRATORY_TRACT | Status: DC | PRN

## 2012-01-26 MED ORDER — BUDESONIDE-FORMOTEROL FUMARATE 80-4.5 MCG/ACT IN AERO: 2.0000 | INHALATION_SPRAY | Freq: Two times a day (BID) | RESPIRATORY_TRACT | Status: DC

## 2012-01-26 MED ORDER — AZITHROMYCIN 500 MG PO TABS: 500.0000 mg | ORAL_TABLET | Freq: Every day | ORAL | Status: DC

## 2012-01-26 MED ORDER — PREDNISONE 20 MG PO TABS: 40.0000 mg | ORAL_TABLET | Freq: Every day | ORAL | Status: AC

## 2012-01-26 MED ORDER — PREDNISONE 20 MG PO TABS
40.0000 mg | ORAL_TABLET | Freq: Every day | ORAL | Status: DC
Start: 2012-01-26 — End: 2012-01-26
  Administered 2012-01-26: 40 mg via ORAL
  Filled 2012-01-26 (×2): qty 2

## 2012-01-26 NOTE — Discharge Summary (Signed)
DISCHARGE SUMMARY  Pamela Solis  MR#: [id number]  DOB:[birth year]  Date of Admission: 01/24/2012 Date of Discharge: 01/26/2012  Attending Physician:Marsh Heckler Butler [place], MD  Patient's PCP: [facility]  Consults:   History of present illness: 51 year old female patient with no prior history of asthma who presented to [facility] with complaints of persistent wheezing and shortness of breath. She states symptoms have been occurring for at least 4 days and had worsened over the past 24 hours. Chest x-ray performed at that time showed no acute process but on clinical exam she was wheezing bilaterally. Despite multiple nebulizer treatments she continued to wheeze and had diminished air sounds throughout. She informed the doctor that earlier in the month her primary care physician diagnosed her with bronchitis and she was prescribed supportive care including an albuterol metered dose inhaler. Laboratory data included a count of 15,400 and slightly elevated glucose of 113. She was admitted with a diagnosis of acute exacerbation of asthma with associated acute bronchitis.  Pertinent discharge information: It is suspected the patient has an underlying diagnosis of asthma and needs formal workup was used to the acute phase, likely formal pulmonary function tests are indicated.  At time of discharge she was on a short steroid taper and and is to begin a Symbicort inhaler once he oral steroids have been completed. She is to resume her rescue inhaler of albuterol when necessary.  Discharge Diagnoses: Principal Problem:  *Asthma exacerbation  Active Problems:  Acute respiratory failure with hypoxia Acute bronchitis-suspect bacterial etiology  HTN (hypertension)  Tachycardia  Depression  Hypokalemia   Radiology: Dg Chest Port 1 View  01/24/2012  *RADIOLOGY REPORT*  Clinical Data: Shortness of breath  PORTABLE CHEST - 1 VIEW  Comparison: None.  Findings: Degraded by patient body  habitus/portable technique.  No focal consolidation.  No pleural effusion or pneumothorax. Cardiomediastinal contours within normal limits.  No acute osseous finding identified.  IMPRESSION: No definite acute process.  Original Report Authenticated By: Waneta Martins, M.D.    Laboratory: Results for orders placed during the hospital encounter of 01/24/12 (from the past 48 hour(s))  GLUCOSE, CAPILLARY     Status: Abnormal   Collection Time   01/24/12  1:25 PM      Component Value Range Comment   Glucose-Capillary 173 (*) 70 - 99 (mg/dL)   GLUCOSE, CAPILLARY     Status: Abnormal   Collection Time   01/24/12  3:55 PM      Component Value Range Comment   Glucose-Capillary 144 (*) 70 - 99 (mg/dL)    Comment 1 Notify RN      Comment 2 Documented in Chart     GLUCOSE, CAPILLARY     Status: Abnormal   Collection Time   01/24/12  7:29 PM      Component Value Range Comment   Glucose-Capillary 138 (*) 70 - 99 (mg/dL)    Comment 1 Notify RN      Comment 2 Documented in Chart     MAGNESIUM     Status: Normal   Collection Time   01/24/12  9:46 PM      Component Value Range Comment   Magnesium 2.0  1.5 - 2.5 (mg/dL)   BASIC METABOLIC PANEL     Status: Abnormal   Collection Time   01/24/12  9:46 PM      Component Value Range Comment   Sodium 136  135 - 145 (mEq/L)    Potassium 3.4 (*) 3.5 - 5.1 (mEq/L)  Chloride 99  96 - 112 (mEq/L)    CO2 24  19 - 32 (mEq/L)    Glucose, Bld 142 (*) 70 - 99 (mg/dL)    BUN 13  6 - 23 (mg/dL)    Creatinine, Ser 4.09  0.50 - 1.10 (mg/dL)    Calcium 9.3  8.4 - 10.5 (mg/dL)    GFR calc non Af Amer 70 (*) >90 (mL/min)    GFR calc Af Amer 81 (*) >90 (mL/min)   GLUCOSE, CAPILLARY     Status: Abnormal   Collection Time   01/25/12  7:43 AM      Component Value Range Comment   Glucose-Capillary 122 (*) 70 - 99 (mg/dL)   GLUCOSE, CAPILLARY     Status: Abnormal   Collection Time   01/25/12 12:13 PM      Component Value Range Comment   Glucose-Capillary 145 (*) 70  - 99 (mg/dL)   GLUCOSE, CAPILLARY     Status: Abnormal   Collection Time   01/25/12  5:06 PM      Component Value Range Comment   Glucose-Capillary 121 (*) 70 - 99 (mg/dL)    Comment 1 Notify RN     GLUCOSE, CAPILLARY     Status: Abnormal   Collection Time   01/25/12  8:34 PM      Component Value Range Comment   Glucose-Capillary 119 (*) 70 - 99 (mg/dL)    Comment 1 Notify RN     BASIC METABOLIC PANEL     Status: Abnormal   Collection Time   01/26/12  5:05 AM      Component Value Range Comment   Sodium 138  135 - 145 (mEq/L)    Potassium 4.1  3.5 - 5.1 (mEq/L)    Chloride 100  96 - 112 (mEq/L)    CO2 28  19 - 32 (mEq/L)    Glucose, Bld 124 (*) 70 - 99 (mg/dL)    BUN 23  6 - 23 (mg/dL)    Creatinine, Ser 8.11  0.50 - 1.10 (mg/dL)    Calcium 9.3  8.4 - 10.5 (mg/dL)    GFR calc non Af Amer >90  >90 (mL/min)    GFR calc Af Amer >90  >90 (mL/min)   HEMOGLOBIN A1C     Status: Abnormal   Collection Time   01/26/12  5:05 AM      Component Value Range Comment   Hemoglobin A1C 5.7 (*) <5.7 (%)    Mean Plasma Glucose 117 (*) <117 (mg/dL)   GLUCOSE, CAPILLARY     Status: Abnormal   Collection Time   01/26/12  7:26 AM      Component Value Range Comment   Glucose-Capillary 175 (*) 70 - 99 (mg/dL)    Comment 1 Notify RN     GLUCOSE, CAPILLARY     Status: Abnormal   Collection Time   01/26/12  7:55 AM      Component Value Range Comment   Glucose-Capillary 177 (*) 70 - 99 (mg/dL)    Comment 1 Notify RN     GLUCOSE, CAPILLARY     Status: Normal   Collection Time   01/26/12 11:32 AM      Component Value Range Comment   Glucose-Capillary 78  70 - 99 (mg/dL)      Medication List  As of 01/26/2012 12:31 PM   TAKE these medications         albuterol 108 (90 BASE) MCG/ACT inhaler   Commonly known  as: PROVENTIL HFA;VENTOLIN HFA   Inhale 2 puffs into the lungs every 6 (six) hours as needed. For wheezing      azithromycin 500 MG tablet   Commonly known as: ZITHROMAX   Take 1 tablet (500 mg total)  by mouth daily.      budesonide-formoterol 80-4.5 MCG/ACT inhaler   Commonly known as: SYMBICORT   Inhale 2 puffs into the lungs 2 (two) times daily. Begin using this inhaler once you have completed the oral prednisone dosing schedule.      cetirizine 10 MG tablet   Commonly known as: ZYRTEC   Take 10 mg by mouth daily.      escitalopram 20 MG tablet   Commonly known as: LEXAPRO   Take 20 mg by mouth daily.      ibuprofen 800 MG tablet   Commonly known as: ADVIL,MOTRIN   Take 800 mg by mouth every 8 (eight) hours as needed. For migraine      predniSONE 20 MG tablet   Commonly known as: DELTASONE   Take 2 tablets (40 mg total) by mouth daily.      triamterene-hydrochlorothiazide 37.5-25 MG per capsule   Commonly known as: DYAZIDE   Take 1 capsule by mouth every morning.              Hospital Course: Principal Problem:  *Asthma exacerbation Active Problems:  Acute respiratory failure with hypoxia  HTN (hypertension)  Tachycardia  Depression  Hypokalemia  Asthma exacerbation/acute hypoxemic respiratory failure This patient presented with symptoms consistent with acute exacerbation of asthma. At presentation she was barely able to move air her lung fields; multiple nebulizer treatments. This led to tachycardia which resolved after the effects of the albuterol treatments subsided. Because of the tachycardia her nebs were changed to Xopenex which she tolerated well. She was also started on high-dose IV Solu-Medrol which was tapered and she will continue a prednisone taper for 4 days after discharge. Once the prednisone is complete she is to transition over to inhaled steroids. A Symbicort prescription has been given to the patient. On the date of discharge she was not having any wheezing although she was still having episodic coughing. Our recommendations are to have the patient proceed with a formal evaluation for the diagnosis of asthma including pulmonary function testing  when she has resolved this acute illness  Acute bronchitis exacerbation Suspect this was the trigger for the acute respiratory failure. She presented with leukocytosis and began to cough up green/yellow sputum after the bronchodilators were started and therefore it was presumed she had a bacterial infection and started on Zithromax. She will continue the Zithromax for a total of 7 days of therapy.  Hypokalemia She presented with mild hypokalemia which worsened after multiple nebulizer treatments with albuterol. She received several doses of oral potassium supplementation and her potassium has normalized since that time.  Day of Discharge BP 118/71  Pulse 75  Temp(Src) 97.5 F (36.4 C) (Oral)  Resp 18  Ht 5\' 6"  (1.676 m)  Wt 133.8 kg (294 lb 15.6 oz)  BMI 47.61 kg/m2  SpO2 99%  Physical Exam:  General appearance: alert, cooperative, appears stated age and no distress Resp: Coarse in the bases but otherwise clear to auscultation bilaterally, no wheezing and adequate air movement noted Cardio: regular rate and rhythm, S1, S2 normal, no murmur, click, rub or gallop GI: soft, non-tender; bowel sounds normal; no masses,  no organomegaly Extremities: extremities normal, atraumatic, no cyanosis or edema Neurologic: Grossly normal  Follow-up: She is to call her primary care physician with [facility] and arrange to be seen next week. She is not to return to work until she is evaluated by her primary care physician after discharge.  Disposition:  To home via private vehicle   Junious Silk, ANP pager [phone]  I have examined the patient and reviewed the chart and discuss discharge plans with the patient and Pamela Solis.  I have modified the above discharge summary which I agree with.   Calvert Cantor, MD [phone]

## 2012-01-26 NOTE — Progress Notes (Signed)
Pt discharged to home per MD order. Pt received all discharge instructions including prescriptions and follow-up appointment information.  Pt also received asthma education.  Pt declined wheelchair escort to exit.  Pt alert and oriented upon discharge with no complaints. Pamela Solis

## 2012-01-26 NOTE — Discharge Instructions (Signed)
Asthma Prevention  Cigarette smoke, house dust, molds, pollens, animal dander, certain insects, exercise, and even cold air are all triggers that can cause an asthma attack. Often, no specific triggers are identified.   Take the following measures around your house to reduce attacks:   Avoid cigarette and other smoke. No smoking should be allowed in a home where someone with asthma lives. If smoking is allowed indoors, it should be done in a room with a closed door, and a window should be opened to clear the air. If possible, do not use a wood-burning stove, kerosene heater, or fireplace. Minimize exposure to all sources of smoke, including incense, candles, fires, and fireworks.   Decrease pollen exposure. Keep your windows shut and use central air during the pollen allergy season. Stay indoors with windows closed from late morning to afternoon, if you can. Avoid mowing the lawn if you have grass pollen allergy. Change your clothes and shower after being outside during this time of year.   Remove molds from bathrooms and wet areas. Do this by cleaning the floors with a fungicide or diluted bleach. Avoid using humidifiers, vaporizers, or swamp coolers. These can spread molds through the air. Fix leaky faucets, pipes, or other sources of water that have mold around them.   Decrease house dust exposure. Do this by using bare floors, vacuuming frequently, and changing furnace and air cooler filters frequently. Avoid using feather, wool, or foam bedding. Use polyester pillows and plastic covers over your mattress. Wash bedding weekly in hot water (hotter than 130 F).   Try to get someone else to vacuum for you once or twice a week, if you can. Stay out of rooms while they are being vacuumed and for a short while afterward. If you vacuum, use a dust mask (from a hardware store), a double-layered or microfilter vacuum cleaner bag, or a vacuum cleaner with a HEPA filter.   Avoid perfumes, talcum powder, hair spray,  paints and other strong odors and fumes.   Keep warm-blooded pets (cats, dogs, rodents, birds) outside the home if they are triggers for asthma. If you can't keep the pet outdoors, keep the pet out of your bedroom and other sleeping areas at all times, and keep the door closed. Remove carpets and furniture covered with cloth from your home. If that is not possible, keep the pet away from fabric-covered furniture and carpets.   Eliminate cockroaches. Keep food and garbage in closed containers. Never leave food out. Use poison baits, traps, powders, gels, or paste (for example, boric acid). If a spray is used to kill cockroaches, stay out of the room until the odor goes away.   Decrease indoor humidity to less than 60%. Use an indoor air cleaning device.   Avoid sulfites in foods and beverages. Do not drink beer or wine or eat dried fruit, processed potatoes, or shrimp if they cause asthma symptoms.   Avoid cold air. Cover your nose and mouth with a scarf on cold or windy days.   Avoid aspirin. This is the most common drug causing serious asthma attacks.   If exercise triggers your asthma, ask your caregiver how you should prepare before exercising. (For example, ask if you could use your inhaler 10 minutes before exercising.)   Avoid close contact with people who have a cold or the flu since your asthma symptoms may get worse if you catch the infection from them. Wash your hands thoroughly after touching items that may have been handled by   respiratory infection.   Get a flu shot every year to protect against the flu virus, which often makes asthma worse for days to weeks. Also get a pneumonia shot once every five to 10 years.  Call your caregiver if you want further information about measures you can take to help prevent asthma attacks. Document Released: 09/12/2005 Document Revised: 09/01/2011 Document Reviewed: 07/21/2009 [facility] Patient Information 2012 [place],  [place].Asthma, Adult Asthma is a disease of the lungs and can make it hard to breathe. Asthma cannot be cured, but medicine can help control it. Asthma may be started (triggered) by:  Pollen.   Dust.   Animal skin flakes (dander).   Molds.   Foods.   Respiratory infections (colds, flu).   Smoke.   Exercise.   Stress.   Other things that cause allergic reactions or allergies (allergens).  HOME CARE   Talk to your doctor about how to manage your attacks at home. This may include:   Using a tool called a peak flow meter.   Having medicine ready to stop the attack.   Take all medicine as told by your doctor.   Wash bed sheets and blankets every week in hot water and put them in the dryer.   Drink enough fluids to keep your pee (urine) clear or pale yellow.   Always be ready to get emergency help. Write down the phone number for your doctor. Keep it where you can easily find it.   Talk about exercise routines with your doctor.   If animal dander is causing your asthma, you may need to find a new home for your pet(s).  GET HELP RIGHT AWAY IF:   You have muscle aches.   You cough more.   You have chest pain.   You have thick spit (sputum) that changes to yellow, green, gray, or bloody.   Medicine does not stop your wheezing.   You have problems breathing.   You have a fever.   Your medicine causes:   A rash.   Itching.   Puffiness (swelling).   Breathing problems.  MAKE SURE YOU:   Understand these instructions.   Will watch your condition.   Will get help right away if you are not doing well or get worse.  Document Released: 02/29/2008 Document Revised: 09/01/2011 Document Reviewed: 07/23/2008 [facility] Patient Information 2012 [place], [place].Asthma FAQ Asthma is a serious condition that causes breathing problems. Some severe attacks can cause death. Wear a bracelet or chain that lets others know you have asthma. HOW DID I GET ASTHMA? You might have  been born with asthma, or you might be allergic to something that causes an asthma attack. WHAT CAUSES AN ASTHMA ATTACK? There are many things that can cause an asthma attack. Some of the most common causes are:  Grass, weed, or tree pollen in the air.   Air pollution.   Dust.   Heavy or hard exercise.   Emotional upset.   Infections.   Smoking and secondhand smoke.   Some medicines like aspirin.   Allergies to:   Animals such as cats, dogs, or rabbits.   Certain foods like wheat, rye, nuts, or shellfish.  HOW DO I KEEP FROM HAVING AN ATTACK?  Refill your medicines before they run out.   Always take your medicine like the doctor tells you. This will help prevent an asthma attack.   If you use an inhaler or diskus, carry it with you at all times.   Stay indoors as  much as possible on ozone alert days. This is when the weather is very cold and when the pollen count is high.   Talk to your nurse or doctor about relaxation techniques that might help.   Stop smoking and stay away from smoke.  WHAT HAPPENS DURING AN ASTHMA ATTACK? The airways in your lungs get smaller and puff up (swell). You:  Have a hard time breathing.   Wheeze.   Cough and produce a lot of mucus.  HOW DO I STOP AN ATTACK?  Take medication as directed by your doctor.   If your medicine is not helping, call your local emergency medical services, and go to the emergency room.  Document Released: 06/21/2008 Document Revised: 09/01/2011 Document Reviewed: 06/21/2008 [facility] Patient Information 2012 [place], [place].Asthma Action Plan, Adult Patient Name: __________________________________________________ Date: ________ Follow-up appointment with physician:   Physician Name: ____________________   Telephone: ____________________   Follow-up recommendation: ____________________  Always bring all of your medications to all of your appointments. POSSIBLE TRIGGERS Tobacco smoke, dust mites, molds,  pets, cockroaches, strong odors and sprays (burning wood in fireplace, incense, scented candles, perfume, paints, cleaning products), exercise, pollen, cold air, or the flu. WHEN WELL: ASTHMA UNDER CONTROL Symptoms: No cough or wheezing, chest tightness or shortness of breath either during the day or at night; can participate in usual activities. If using a peak flow meter: My optimal peak flow is: _____ to _____ (should be 80-100% of personal best) Medicine(s): Every day:  Controller: ________________ How much? ________________ When? ________________   Controller: ________________ How much? ________________ When? ________________  Before exercise:  Reliever: ________________ How much? ________________ When? ________________  If symptoms are noted:  Reliever/Rescue: ________________ How much? ________________ When? ________________  Call your physician if using reliever more than 2-3 times per week. WHEN NOT WELL: ASTHMA GETTING WORSE Symptoms: Cough, wheeze, shortness of breath, chest tightness, waking at night due to asthma, unable to participate in all of usual activities. If using a peak flow meter: My peak flow is: _____ to _____ (50-79% of personal best) Add the following medicine to those used daily:  Reliever/Rescue: ________________ How much? ________________ When? ________________  If symptoms and peak flow return to GREEN ZONE after 1 hour of above treatment, continue monitoring to make sure you remain in green zone. If symptoms and peak flow DO NOT return to GREEN ZONE after 1 hour of above treatment:  Reliever/Rescue: ________________ How much? ________________ When? ________________   Oral Steroids: ________________ How much? ________________ When? ________________   Call your doctor if: ________________________________________________________________  IF SYMPTOMS GET WORSE: ASTHMA IS SEVERE - GET HELP NOW! Symptoms: Severely short of breath, rescue meds have not  helped, cannot participate in usual activities, you are having trouble walking or talking due to asthma symptoms, you are dizzy or faint, your fingernails or lips are bluish, your symptoms are the same or worse after 24 hours in Yellow Caution Zone. If using a peak flow meter: My peak flow is: less than _____ (50% of personal best) Add the following medicine to those used daily:  Reliever/Rescue: ________________ How much? ________________ When? ________________   Oral Steroids: ________________ How much? ________________ When? ________________   CALL YOUR DOCTOR IMMEDIATELY.  Have someone drive you to the hospital right away or call your local emergency services (911 in [place] if you are in the red danger zone after 15 minutes and you have not reached your doctor. Document Released: 07/10/2009 Document Revised: 09/01/2011 Document Reviewed: 07/10/2009 [employer] Patient Information 2012  [employer].Metered Dose Inhaler (No Spacer Used) Inhaled medicines are the basis of asthma treatment and other breathing problems. Inhaled medicine can only be effective if used properly. Good technique assures that the medicine reaches the lungs. Metered dose inhalers (MDIs) are used to deliver a variety of inhaled medicines. These include quick relief medicines, controller medicines (such as corticosteroids), and cromolyn. The medicine is delivered by pushing down on a metal canister to release a set amount of spray.  If you are using different kinds of inhalers, use your quick relief medicine to open the airways 10 to 15 minutes before using a steroid. If you are unsure which inhalers to use and the order of using them, ask your caregiver, nurse, or respiratory therapist. HOW TO USE THE INHALER 1. Remove cap from inhaler.  2. Shake inhaler for 5 seconds before each inhalation (breathing in).  3. Position the inhaler so that the top of the canister faces up.  4. Put your index finger on the top of the  medication canister. Your thumb supports the bottom of the inhaler.  5. Open your mouth.  6. Hold the inhaler 1 to 2 inches away from your open mouth. This allows the medicine to slow down before the medicine enters the mouth.  7. Exhale (breathe out) normally and as completely as possible.  8. Press the canister down with the index finger to release the medication.  9. At the same time as the canister is pressed, inhale deeply and slowly until the lungs are completely filled. This should take 4 to 6 seconds. Keep your tongue down.  10. Hold the medication in your lungs for up to 10 seconds (10 seconds is best). This helps the medicine get into the small airways of your lungs to work better.  11. Breathe out slowly, through pursed lips. Whistling is an example of pursed lips.  12. Wait at least 1 minute between puffs. Continue with the above steps until you have taken the number of puffs your caregiver has ordered.  13. Replace cap on inhaler.  AVOID:  Inhaling before or after starting the spray of medicine. It takes practice to coordinate your breathing with triggering the spray.   Inhaling through the nose (rather than the mouth) when triggering the spray.  HOW TO DETERMINE IF YOUR INHALER IS FULL OR NEARLY EMPTY:  Determine when an inhaler is empty. You cannot know when an MDI canister is empty by shaking it. A few MDIs are now being made with dose counters. Ask your caregiver for a prescription that has a dose counter if you feel you need that extra help.   If your inhaler does not have a counter, check the number of doses in the inhaler before you use it. The canister or box will list the number of doses in the canister. Divide the total number of doses in the canister by the number you will use each day to find how many days the canister will last. (For example, if your canister has 200 doses and you take 2 puffs, 4 times each day, which is 8 puffs a day. Dividing 200 by 8 equals 25. The  canister should last 25 days.) Using a calendar, count forward that many days to see when your inhaler will run out. Write the refill date on a calendar or your canister.   Remember, if you need to take extra doses, the inhaler will empty sooner than you figured. Be sure you have a refill before your canister  runs out. Refill your inhaler 7 to 10 days before it runs out.  HOME CARE INSTRUCTIONS   Do not use the inhaler more than your caregiver tells you. If you are still wheezing and are feeling tightness in your chest, call your caregiver.   Keep an adequate supply of medication. This includes making sure the medicine is not expired, and you have a spare MDI.   Follow your caregiver or inhaler insert directions for cleaning the inhaler.  SEEK MEDICAL CARE IF:   Symptoms are only partially relieved with your inhalers.   You are having trouble using your inhalers.   You experience some increase in phlegm.   You develop a fever of 102 F (38.9 C).  SEEK IMMEDIATE MEDICAL CARE IF:   You feel little or no relief with your inhalers. You are still wheezing and are feeling shortness of breath and/or tightness in your chest.   You have side effects such as dizziness, headaches, or fast heart rate.   You have chills, fever, night sweats or an oral temperature above 102 F (38.9 C) develops.   Phlegm production increases a lot, or there is blood in the phlegm.  MAKE SURE YOU:   Understand these instructions.   Will watch your condition.   Will get help right away if you are not doing well or get worse.  Document Released: 07/10/2007 Document Revised: 09/01/2011 Document Reviewed: 06/30/2009 [facility] Patient Information 2012 [place], [place].Pulmonary Function Tests A pulmonary function test measures how well you move air in and out of your lungs. There are a number of tests that can be done. The most often the measurement used is the peak flow test. This test can also be used at home.  Examples of these include the Tru Zone, Astech, [place], and Spir-O-Flow meters.  Measuring how well air moves out of your lung can be used as a reliable guide to treatment, even if you have no symptoms. Normal values of peak flow rates depend on your age, height, and sex. If your peak flow measurement is lower than normal, treatment with inhaled medicines can be started to prevent a more serious episode of asthma or emphysema. You should measure your peak flows daily in the morning, and more often throughout the day if you have any symptoms such as shortness of breath or cough. Keep a record of your peak flow results to review with your caregiver. The mouth piece of your peak flow meter should be washed with soap and water once a week. Otherwise it does not need special care. To measure your peak flow, proper technique is very important. Follow the instructions that accompany your meter. Usually you must set the indicator to 0, take a deep breath, and blow as quickly and forcefully as possible into the meter. Reset the meter and repeat the measurement twice; record your best result. If your test results put you in the "green" zone, this means your ability to move air is 80-100% of normal. No special treatment may be needed. If your test puts you in the "yellow" zone, you are between 50 and 80% of normal. Treatment should be started. Results in the "red" zone mean you are below 50% of normal and you should initiate treatment and call your caregiver right away. Document Released: 10/20/2004 Document Revised: 09/01/2011 Document Reviewed: 09/12/2005 [facility] Patient Information 2012 [place], [place].

## 2012-01-26 NOTE — Progress Notes (Signed)
Patients initial sat before ambulation was 90-91% on Room air. Instructed her to take some deep breathes and sats up to 93-94%. Patient walked the length of the hall about 3 times with sats maintaining at 95% room air. After ambulation and some rest sats at 95% on room air. Encouraged patient to take deep breathes when at rest. Will cont to monitor.

## 2013-07-02 ENCOUNTER — Encounter: Payer: Self-pay | Admitting: Gastroenterology

## 2013-09-10 ENCOUNTER — Encounter: Payer: 59 | Admitting: Gastroenterology

## 2014-03-24 ENCOUNTER — Encounter: Payer: Self-pay | Admitting: Family Medicine

## 2014-08-06 DIAGNOSIS — J302 Other seasonal allergic rhinitis: Secondary | ICD-10-CM | POA: Insufficient documentation

## 2014-08-06 DIAGNOSIS — F419 Anxiety disorder, unspecified: Secondary | ICD-10-CM | POA: Insufficient documentation

## 2014-11-14 ENCOUNTER — Ambulatory Visit (INDEPENDENT_AMBULATORY_CARE_PROVIDER_SITE_OTHER): Payer: 59 | Admitting: Family Medicine

## 2014-11-14 ENCOUNTER — Encounter: Payer: Self-pay | Admitting: Family Medicine

## 2014-11-14 VITALS — BP 124/84 | HR 84 | Temp 98.1°F | Ht 65.0 in | Wt 306.5 lb

## 2014-11-14 DIAGNOSIS — F32A Depression, unspecified: Secondary | ICD-10-CM

## 2014-11-14 DIAGNOSIS — R000 Tachycardia, unspecified: Secondary | ICD-10-CM

## 2014-11-14 DIAGNOSIS — D649 Anemia, unspecified: Secondary | ICD-10-CM | POA: Insufficient documentation

## 2014-11-14 DIAGNOSIS — F329 Major depressive disorder, single episode, unspecified: Secondary | ICD-10-CM

## 2014-11-14 DIAGNOSIS — R0683 Snoring: Secondary | ICD-10-CM

## 2014-11-14 DIAGNOSIS — G540 Brachial plexus disorders: Secondary | ICD-10-CM

## 2014-11-14 DIAGNOSIS — I10 Essential (primary) hypertension: Secondary | ICD-10-CM

## 2014-11-14 DIAGNOSIS — R609 Edema, unspecified: Secondary | ICD-10-CM

## 2014-11-14 DIAGNOSIS — E669 Obesity, unspecified: Secondary | ICD-10-CM

## 2014-11-14 MED ORDER — FLUTICASONE PROPIONATE 50 MCG/ACT NA SUSP: 2.0000 | Freq: Every day | NASAL | Status: DC | PRN

## 2014-11-14 NOTE — Patient Instructions (Addendum)
[facility] [address] Lorayne Marek Jobst 10-20 mmhg Salon pas gel or patches twice daily and ice and splints  Preventive Care for Adults A healthy lifestyle and preventive care can promote health and wellness. Preventive health guidelines for women include the following key practices.  A routine yearly physical is a good way to check with your health care provider about your health and preventive screening. It is a chance to share any concerns and updates on your health and to receive a thorough exam.  Visit your dentist for a routine exam and preventive care every 6 months. Brush your teeth twice a day and floss once a day. Good oral hygiene prevents tooth decay and gum disease.  The frequency of eye exams is based on your age, health, family medical history, use of contact lenses, and other factors. Follow your health care provider's recommendations for frequency of eye exams.  Eat a healthy diet. Foods like vegetables, fruits, whole grains, low-fat dairy products, and lean protein foods contain the nutrients you need without too many calories. Decrease your intake of foods high in solid fats, added sugars, and salt. Eat the right amount of calories for you.Get information about a proper diet from your health care provider, if necessary.  Regular physical exercise is one of the most important things you can do for your health. Most adults should get at least 150 minutes of moderate-intensity exercise (any activity that increases your heart rate and causes you to sweat) each week. In addition, most adults need muscle-strengthening exercises on 2 or more days a week.  Maintain a healthy weight. The body mass index (BMI) is a screening tool to identify possible weight problems. It provides an estimate of body fat based on height and weight. Your health care provider can find your BMI and can help you achieve or maintain a healthy weight.For adults 20 years and older:  A BMI below 18.5 is  considered underweight.  A BMI of 18.5 to 24.9 is normal.  A BMI of 25 to 29.9 is considered overweight.  A BMI of 30 and above is considered obese.  Maintain normal blood lipids and cholesterol levels by exercising and minimizing your intake of saturated fat. Eat a balanced diet with plenty of fruit and vegetables. Blood tests for lipids and cholesterol should begin at age 31 and be repeated every 5 years. If your lipid or cholesterol levels are high, you are over 50, or you are at high risk for heart disease, you may need your cholesterol levels checked more frequently.Ongoing high lipid and cholesterol levels should be treated with medicines if diet and exercise are not working.  If you smoke, find out from your health care provider how to quit. If you do not use tobacco, do not start.  Lung cancer screening is recommended for adults aged 2-80 years who are at high risk for developing lung cancer because of a history of smoking. A yearly low-dose CT scan of the lungs is recommended for people who have at least a 30-pack-year history of smoking and are a current smoker or have quit within the past 15 years. A pack year of smoking is smoking an average of 1 pack of cigarettes a day for 1 year (for example: 1 pack a day for 30 years or 2 packs a day for 15 years). Yearly screening should continue until the smoker has stopped smoking for at least 15 years. Yearly screening should be stopped for people who develop a health problem  that would prevent them from having lung cancer treatment.  If you are pregnant, do not drink alcohol. If you are breastfeeding, be very cautious about drinking alcohol. If you are not pregnant and choose to drink alcohol, do not have more than 1 drink per day. One drink is considered to be 12 ounces (355 mL) of beer, 5 ounces (148 mL) of wine, or 1.5 ounces (44 mL) of liquor.  Avoid use of street drugs. Do not share needles with anyone. Ask for help if you need support or  instructions about stopping the use of drugs.  High blood pressure causes heart disease and increases the risk of stroke. Your blood pressure should be checked at least every 1 to 2 years. Ongoing high blood pressure should be treated with medicines if weight loss and exercise do not work.  If you are 55-79 years old, ask your health care provider if you should take aspirin to prevent strokes.  Diabetes screening involves taking a blood sample to check your fasting blood sugar level. This should be done once every 3 years, after age 45, if you are within normal weight and without risk factors for diabetes. Testing should be considered at a younger age or be carried out more frequently if you are overweight and have at least 1 risk factor for diabetes.  Breast cancer screening is essential preventive care for women. You should practice "breast self-awareness." This means understanding the normal appearance and feel of your breasts and may include breast self-examination. Any changes detected, no matter how small, should be reported to a health care provider. Women in their 20s and 30s should have a clinical breast exam (CBE) by a health care provider as part of a regular health exam every 1 to 3 years. After age 40, women should have a CBE every year. Starting at age 40, women should consider having a mammogram (breast X-ray test) every year. Women who have a family history of breast cancer should talk to their health care provider about genetic screening. Women at a high risk of breast cancer should talk to their health care providers about having an MRI and a mammogram every year.  Breast cancer gene (BRCA)-related cancer risk assessment is recommended for women who have family members with BRCA-related cancers. BRCA-related cancers include breast, ovarian, tubal, and peritoneal cancers. Having family members with these cancers may be associated with an increased risk for harmful changes (mutations) in  the breast cancer genes BRCA1 and BRCA2. Results of the assessment will determine the need for genetic counseling and BRCA1 and BRCA2 testing.  Routine pelvic exams to screen for cancer are no longer recommended for nonpregnant women who are considered low risk for cancer of the pelvic organs (ovaries, uterus, and vagina) and who do not have symptoms. Ask your health care provider if a screening pelvic exam is right for you.  If you have had past treatment for cervical cancer or a condition that could lead to cancer, you need Pap tests and screening for cancer for at least 20 years after your treatment. If Pap tests have been discontinued, your risk factors (such as having a new sexual partner) need to be reassessed to determine if screening should be resumed. Some women have medical problems that increase the chance of getting cervical cancer. In these cases, your health care provider may recommend more frequent screening and Pap tests.  The HPV test is an additional test that may be used for cervical cancer screening. The HPV test   looks for the virus that can cause the cell changes on the cervix. The cells collected during the Pap test can be tested for HPV. The HPV test could be used to screen women aged 23 years and older, and should be used in women of any age who have unclear Pap test results. After the age of 28, women should have HPV testing at the same frequency as a Pap test.  Colorectal cancer can be detected and often prevented. Most routine colorectal cancer screening begins at the age of 93 years and continues through age 46 years. However, your health care provider may recommend screening at an earlier age if you have risk factors for colon cancer. On a yearly basis, your health care provider may provide home test kits to check for hidden blood in the stool. Use of a small camera at the end of a tube, to directly examine the colon (sigmoidoscopy or colonoscopy), can detect the earliest forms  of colorectal cancer. Talk to your health care provider about this at age 29, when routine screening begins. Direct exam of the colon should be repeated every 5-10 years through age 17 years, unless early forms of pre-cancerous polyps or small growths are found.  People who are at an increased risk for hepatitis B should be screened for this virus. You are considered at high risk for hepatitis B if:  You were born in a country where hepatitis B occurs often. Talk with your health care provider about which countries are considered high risk.  Your parents were born in a high-risk country and you have not received a shot to protect against hepatitis B (hepatitis B vaccine).  You have HIV or AIDS.  You use needles to inject street drugs.  You live with, or have sex with, someone who has hepatitis B.  You get hemodialysis treatment.  You take certain medicines for conditions like cancer, organ transplantation, and autoimmune conditions.  Hepatitis C blood testing is recommended for all people born from [birth year] through 1965 and any individual with known risks for hepatitis C.  Practice safe sex. Use condoms and avoid high-risk sexual practices to reduce the spread of sexually transmitted infections (STIs). STIs include gonorrhea, chlamydia, syphilis, trichomonas, herpes, HPV, and human immunodeficiency virus (HIV). Herpes, HIV, and HPV are viral illnesses that have no cure. They can result in disability, cancer, and death.  You should be screened for sexually transmitted illnesses (STIs) including gonorrhea and chlamydia if:  You are sexually active and are younger than 24 years.  You are older than 24 years and your health care provider tells you that you are at risk for this type of infection.  Your sexual activity has changed since you were last screened and you are at an increased risk for chlamydia or gonorrhea. Ask your health care provider if you are at risk.  If you are at risk of  being infected with HIV, it is recommended that you take a prescription medicine daily to prevent HIV infection. This is called preexposure prophylaxis (PrEP). You are considered at risk if:  You are a heterosexual woman, are sexually active, and are at increased risk for HIV infection.  You take drugs by injection.  You are sexually active with a partner who has HIV.  Talk with your health care provider about whether you are at high risk of being infected with HIV. If you choose to begin PrEP, you should first be tested for HIV. You should then be tested every 3  months for as long as you are taking PrEP.  Osteoporosis is a disease in which the bones lose minerals and strength with aging. This can result in serious bone fractures or breaks. The risk of osteoporosis can be identified using a bone density scan. Women ages 78 years and over and women at risk for fractures or osteoporosis should discuss screening with their health care providers. Ask your health care provider whether you should take a calcium supplement or vitamin D to reduce the rate of osteoporosis.  Menopause can be associated with physical symptoms and risks. Hormone replacement therapy is available to decrease symptoms and risks. You should talk to your health care provider about whether hormone replacement therapy is right for you.  Use sunscreen. Apply sunscreen liberally and repeatedly throughout the day. You should seek shade when your shadow is shorter than you. Protect yourself by wearing long sleeves, pants, a wide-brimmed hat, and sunglasses year round, whenever you are outdoors.  Once a month, do a whole body skin exam, using a mirror to look at the skin on your back. Tell your health care provider of new moles, moles that have irregular borders, moles that are larger than a pencil eraser, or moles that have changed in shape or color.  Stay current with required vaccines (immunizations).  Influenza vaccine. All adults  should be immunized every year.  Tetanus, diphtheria, and acellular pertussis (Td, Tdap) vaccine. Pregnant women should receive 1 dose of Tdap vaccine during each pregnancy. The dose should be obtained regardless of the length of time since the last dose. Immunization is preferred during the 27th-36th week of gestation. An adult who has not previously received Tdap or who does not know her vaccine status should receive 1 dose of Tdap. This initial dose should be followed by tetanus and diphtheria toxoids (Td) booster doses every 10 years. Adults with an unknown or incomplete history of completing a 3-dose immunization series with Td-containing vaccines should begin or complete a primary immunization series including a Tdap dose. Adults should receive a Td booster every 10 years.  Varicella vaccine. An adult without evidence of immunity to varicella should receive 2 doses or a second dose if she has previously received 1 dose. Pregnant females who do not have evidence of immunity should receive the first dose after pregnancy. This first dose should be obtained before leaving the health care facility. The second dose should be obtained 4-8 weeks after the first dose.  Human papillomavirus (HPV) vaccine. Females aged 13-26 years who have not received the vaccine previously should obtain the 3-dose series. The vaccine is not recommended for use in pregnant females. However, pregnancy testing is not needed before receiving a dose. If a female is found to be pregnant after receiving a dose, no treatment is needed. In that case, the remaining doses should be delayed until after the pregnancy. Immunization is recommended for any person with an immunocompromised condition through the age of 65 years if she did not get any or all doses earlier. During the 3-dose series, the second dose should be obtained 4-8 weeks after the first dose. The third dose should be obtained 24 weeks after the first dose and 16 weeks after  the second dose.  Zoster vaccine. One dose is recommended for adults aged 11 years or older unless certain conditions are present.  Measles, mumps, and rubella (MMR) vaccine. Adults born before [birth year] generally are considered immune to measles and mumps. Adults born in [birth year] or later should have 1  or more doses of MMR vaccine unless there is a contraindication to the vaccine or there is laboratory evidence of immunity to each of the three diseases. A routine second dose of MMR vaccine should be obtained at least 28 days after the first dose for students attending postsecondary schools, health care workers, or international travelers. People who received inactivated measles vaccine or an unknown type of measles vaccine during 1963-1967 should receive 2 doses of MMR vaccine. People who received inactivated mumps vaccine or an unknown type of mumps vaccine before 1979 and are at high risk for mumps infection should consider immunization with 2 doses of MMR vaccine. For females of childbearing age, rubella immunity should be determined. If there is no evidence of immunity, females who are not pregnant should be vaccinated. If there is no evidence of immunity, females who are pregnant should delay immunization until after pregnancy. Unvaccinated health care workers born before 1957 who lack laboratory evidence of measles, mumps, or rubella immunity or laboratory confirmation of disease should consider measles and mumps immunization with 2 doses of MMR vaccine or rubella immunization with 1 dose of MMR vaccine.  Pneumococcal 13-valent conjugate (PCV13) vaccine. When indicated, a person who is uncertain of her immunization history and has no record of immunization should receive the PCV13 vaccine. An adult aged 19 years or older who has certain medical conditions and has not been previously immunized should receive 1 dose of PCV13 vaccine. This PCV13 should be followed with a dose of pneumococcal polysaccharide (PPSV23)  vaccine. The PPSV23 vaccine dose should be obtained at least 8 weeks after the dose of PCV13 vaccine. An adult aged 19 years or older who has certain medical conditions and previously received 1 or more doses of PPSV23 vaccine should receive 1 dose of PCV13. The PCV13 vaccine dose should be obtained 1 or more years after the last PPSV23 vaccine dose.  Pneumococcal polysaccharide (PPSV23) vaccine. When PCV13 is also indicated, PCV13 should be obtained first. All adults aged 65 years and older should be immunized. An adult younger than age 65 years who has certain medical conditions should be immunized. Any person who resides in a nursing home or long-term care facility should be immunized. An adult smoker should be immunized. People with an immunocompromised condition and certain other conditions should receive both PCV13 and PPSV23 vaccines. People with human immunodeficiency virus (HIV) infection should be immunized as soon as possible after diagnosis. Immunization during chemotherapy or radiation therapy should be avoided. Routine use of PPSV23 vaccine is not recommended for American Indians, [place] Natives, or people younger than 65 years unless there are medical conditions that require PPSV23 vaccine. When indicated, people who have unknown immunization and have no record of immunization should receive PPSV23 vaccine. One-time revaccination 5 years after the first dose of PPSV23 is recommended for people aged 19-64 years who have chronic kidney failure, nephrotic syndrome, asplenia, or immunocompromised conditions. People who received 1-2 doses of PPSV23 before age 65 years should receive another dose of PPSV23 vaccine at age 65 years or later if at least 5 years have passed since the previous dose. Doses of PPSV23 are not needed for people immunized with PPSV23 at or after age 65 years.  Meningococcal vaccine. Adults with asplenia or persistent complement component deficiencies should receive 2 doses of  quadrivalent meningococcal conjugate (MenACWY-D) vaccine. The doses should be obtained at least 2 months apart. Microbiologists working with certain meningococcal bacteria, military recruits, people at risk during an outbreak, and people who   travel to or live in countries with a high rate of meningitis should be immunized. A first-year college student up through age 63 years who is living in a residence hall should receive a dose if she did not receive a dose on or after her 16th birthday. Adults who have certain high-risk conditions should receive one or more doses of vaccine.  Hepatitis A vaccine. Adults who wish to be protected from this disease, have certain high-risk conditions, work with hepatitis A-infected animals, work in hepatitis A research labs, or travel to or work in countries with a high rate of hepatitis A should be immunized. Adults who were previously unvaccinated and who anticipate close contact with an international adoptee during the first 60 days after arrival in the [place] from a country with a high rate of hepatitis A should be immunized.  Hepatitis B vaccine. Adults who wish to be protected from this disease, have certain high-risk conditions, may be exposed to blood or other infectious body fluids, are household contacts or sex partners of hepatitis B positive people, are clients or workers in certain care facilities, or travel to or work in countries with a high rate of hepatitis B should be immunized.  Haemophilus influenzae type b (Hib) vaccine. A previously unvaccinated person with asplenia or sickle cell disease or having a scheduled splenectomy should receive 1 dose of Hib vaccine. Regardless of previous immunization, a recipient of a hematopoietic stem cell transplant should receive a 3-dose series 6-12 months after her successful transplant. Hib vaccine is not recommended for adults with HIV infection. Preventive Services / Frequency Ages 31 to 10 years  Blood  pressure check.** / Every 1 to 2 years.  Lipid and cholesterol check.** / Every 5 years beginning at age 47.  Clinical breast exam.** / Every 3 years for women in their 21s and 88s.  BRCA-related cancer risk assessment.** / For women who have family members with a BRCA-related cancer (breast, ovarian, tubal, or peritoneal cancers).  Pap test.** / Every 2 years from ages 76 through 11. Every 3 years starting at age 45 through age 52 or 66 with a history of 3 consecutive normal Pap tests.  HPV screening.** / Every 3 years from ages 31 through ages 23 to 85 with a history of 3 consecutive normal Pap tests.  Hepatitis C blood test.** / For any individual with known risks for hepatitis C.  Skin self-exam. / Monthly.  Influenza vaccine. / Every year.  Tetanus, diphtheria, and acellular pertussis (Tdap, Td) vaccine.** / Consult your health care provider. Pregnant women should receive 1 dose of Tdap vaccine during each pregnancy. 1 dose of Td every 10 years.  Varicella vaccine.** / Consult your health care provider. Pregnant females who do not have evidence of immunity should receive the first dose after pregnancy.  HPV vaccine. / 3 doses over 6 months, if 6 and younger. The vaccine is not recommended for use in pregnant females. However, pregnancy testing is not needed before receiving a dose.  Measles, mumps, rubella (MMR) vaccine.** / You need at least 1 dose of MMR if you were born in 1957 or later. You may also need a 2nd dose. For females of childbearing age, rubella immunity should be determined. If there is no evidence of immunity, females who are not pregnant should be vaccinated. If there is no evidence of immunity, females who are pregnant should delay immunization until after pregnancy.  Pneumococcal 13-valent conjugate (PCV13) vaccine.** / Consult your health care provider.  Pneumococcal polysaccharide (PPSV23) vaccine.** / 1 to 2 doses if you smoke cigarettes or if you have certain  conditions.  Meningococcal vaccine.** / 1 dose if you are age 30 to 49 years and a Market researcher living in a residence hall, or have one of several medical conditions, you need to get vaccinated against meningococcal disease. You may also need additional booster doses.  Hepatitis A vaccine.** / Consult your health care provider.  Hepatitis B vaccine.** / Consult your health care provider.  Haemophilus influenzae type b (Hib) vaccine.** / Consult your health care provider. Ages 13 to 39 years  Blood pressure check.** / Every 1 to 2 years.  Lipid and cholesterol check.** / Every 5 years beginning at age 71 years.  Lung cancer screening. / Every year if you are aged 43-80 years and have a 30-pack-year history of smoking and currently smoke or have quit within the past 15 years. Yearly screening is stopped once you have quit smoking for at least 15 years or develop a health problem that would prevent you from having lung cancer treatment.  Clinical breast exam.** / Every year after age 3 years.  BRCA-related cancer risk assessment.** / For women who have family members with a BRCA-related cancer (breast, ovarian, tubal, or peritoneal cancers).  Mammogram.** / Every year beginning at age 75 years and continuing for as long as you are in good health. Consult with your health care provider.  Pap test.** / Every 3 years starting at age 38 years through age 59 or 55 years with a history of 3 consecutive normal Pap tests.  HPV screening.** / Every 3 years from ages 71 years through ages 3 to 46 years with a history of 3 consecutive normal Pap tests.  Fecal occult blood test (FOBT) of stool. / Every year beginning at age 55 years and continuing until age 80 years. You may not need to do this test if you get a colonoscopy every 10 years.  Flexible sigmoidoscopy or colonoscopy.** / Every 5 years for a flexible sigmoidoscopy or every 10 years for a colonoscopy beginning at age 31 years  and continuing until age 76 years.  Hepatitis C blood test.** / For all people born from [birth year] through 1963 and any individual with known risks for hepatitis C.  Skin self-exam. / Monthly.  Influenza vaccine. / Every year.  Tetanus, diphtheria, and acellular pertussis (Tdap/Td) vaccine.** / Consult your health care provider. Pregnant women should receive 1 dose of Tdap vaccine during each pregnancy. 1 dose of Td every 10 years.  Varicella vaccine.** / Consult your health care provider. Pregnant females who do not have evidence of immunity should receive the first dose after pregnancy.  Zoster vaccine.** / 1 dose for adults aged 38 years or older.  Measles, mumps, rubella (MMR) vaccine.** / You need at least 1 dose of MMR if you were born in 1957 or later. You may also need a 2nd dose. For females of childbearing age, rubella immunity should be determined. If there is no evidence of immunity, females who are not pregnant should be vaccinated. If there is no evidence of immunity, females who are pregnant should delay immunization until after pregnancy.  Pneumococcal 13-valent conjugate (PCV13) vaccine.** / Consult your health care provider.  Pneumococcal polysaccharide (PPSV23) vaccine.** / 1 to 2 doses if you smoke cigarettes or if you have certain conditions.  Meningococcal vaccine.** / Consult your health care provider.  Hepatitis A vaccine.** / Consult your health care provider.  Hepatitis  B vaccine.** / Consult your health care provider.  Haemophilus influenzae type b (Hib) vaccine.** / Consult your health care provider. Ages 45 years and over  Blood pressure check.** / Every 1 to 2 years.  Lipid and cholesterol check.** / Every 5 years beginning at age 16 years.  Lung cancer screening. / Every year if you are aged 74-80 years and have a 30-pack-year history of smoking and currently smoke or have quit within the past 15 years. Yearly screening is stopped once you have quit smoking  for at least 15 years or develop a health problem that would prevent you from having lung cancer treatment.  Clinical breast exam.** / Every year after age 34 years.  BRCA-related cancer risk assessment.** / For women who have family members with a BRCA-related cancer (breast, ovarian, tubal, or peritoneal cancers).  Mammogram.** / Every year beginning at age 82 years and continuing for as long as you are in good health. Consult with your health care provider.  Pap test.** / Every 3 years starting at age 16 years through age 10 or 87 years with 3 consecutive normal Pap tests. Testing can be stopped between 65 and 70 years with 3 consecutive normal Pap tests and no abnormal Pap or HPV tests in the past 10 years.  HPV screening.** / Every 3 years from ages 40 years through ages 16 or 61 years with a history of 3 consecutive normal Pap tests. Testing can be stopped between 65 and 70 years with 3 consecutive normal Pap tests and no abnormal Pap or HPV tests in the past 10 years.  Fecal occult blood test (FOBT) of stool. / Every year beginning at age 16 years and continuing until age 48 years. You may not need to do this test if you get a colonoscopy every 10 years.  Flexible sigmoidoscopy or colonoscopy.** / Every 5 years for a flexible sigmoidoscopy or every 10 years for a colonoscopy beginning at age 3 years and continuing until age 48 years.  Hepatitis C blood test.** / For all people born from [birth year] through 1963 and any individual with known risks for hepatitis C.  Osteoporosis screening.** / A one-time screening for women ages 54 years and over and women at risk for fractures or osteoporosis.  Skin self-exam. / Monthly.  Influenza vaccine. / Every year.  Tetanus, diphtheria, and acellular pertussis (Tdap/Td) vaccine.** / 1 dose of Td every 10 years.  Varicella vaccine.** / Consult your health care provider.  Zoster vaccine.** / 1 dose for adults aged 78 years or older.  Pneumococcal  13-valent conjugate (PCV13) vaccine.** / Consult your health care provider.  Pneumococcal polysaccharide (PPSV23) vaccine.** / 1 dose for all adults aged 108 years and older.  Meningococcal vaccine.** / Consult your health care provider.  Hepatitis A vaccine.** / Consult your health care provider.  Hepatitis B vaccine.** / Consult your health care provider.  Haemophilus influenzae type b (Hib) vaccine.** / Consult your health care provider. ** Family history and personal history of risk and conditions may change your health care provider's recommendations. Document Released: 11/08/2001 Document Revised: 01/27/2014 Document Reviewed: 02/07/2011 [facility] Patient Information 2015 [place], [place]. This information is not intended to replace advice given to you by your health care provider. Make sure you discuss any questions you have with your health care provider.

## 2014-11-14 NOTE — Progress Notes (Signed)
Pre visit review using our clinic review tool, if applicable. No additional management support is needed unless otherwise documented below in the visit note. 

## 2014-11-17 ENCOUNTER — Encounter: Payer: Self-pay | Admitting: Family Medicine

## 2014-11-24 ENCOUNTER — Encounter: Payer: Self-pay | Admitting: Family Medicine

## 2014-11-24 DIAGNOSIS — R609 Edema, unspecified: Secondary | ICD-10-CM

## 2014-11-24 DIAGNOSIS — G540 Brachial plexus disorders: Secondary | ICD-10-CM

## 2014-11-24 DIAGNOSIS — R0683 Snoring: Secondary | ICD-10-CM | POA: Insufficient documentation

## 2014-11-24 HISTORY — DX: Brachial plexus disorders: G54.0

## 2014-11-24 HISTORY — DX: Snoring: R06.83

## 2014-11-24 HISTORY — DX: Edema, unspecified: R60.9

## 2014-11-24 NOTE — Assessment & Plan Note (Signed)
Encouraged moist heat and gentle stretching of neck, try Salon Pas patches and gel and report if symptoms worsen

## 2014-11-24 NOTE — Assessment & Plan Note (Signed)
Minimize sodium elevate feet above heart and try compression hose in am

## 2014-11-24 NOTE — Progress Notes (Signed)
Pamela Solis [id number] March 07, 1961 11/24/2014      Progress Note New Patient  Subjective  Chief Complaint  Chief Complaint  Patient presents with  . Establish Care    HPI  Patient is a 54 year old female in today for routine medical care. Patient is in today to establish care. Is struggling with poor sleep. Wakes frequently. Notes snoring and excessive fatigue. She also is complaining of numbness in bilateral hands left and right. She's had this for quite some time and it previously was more intermittent. Would occur after prolonged activity or upon awakening but is becoming more persistent at this time. No recent falls or trauma. Denies CP/palp/SOB/HA/congestion/fevers/GI or GU c/o. Taking meds as prescribed  Past Medical History  Diagnosis Date  . Asthma   . Hypertension   . Depression   . Allergy   . Obesity   . Anemia   . Chronic kidney disease   . Snoring 11/24/2014    Past Surgical History  Procedure Laterality Date  . Cesarean section    . Tubal ligation      Family History  Problem Relation Age of Onset  . Obesity Mother   . Arthritis Mother     rheumatoid  . Stroke Father     History   Social History  . Marital Status: Married    Spouse Name: N/A  . Number of Children: N/A  . Years of Education: N/A   Occupational History  . Not on file.   Social History Main Topics  . Smoking status: Never Smoker   . Smokeless tobacco: Not on file  . Alcohol Use: No  . Drug Use: No  . Sexual Activity: Not on file     Comment: lives with husband and daughters, works for [employer] no dietary restrictions   Other Topics Concern  . Not on file   Social History Narrative    Current Outpatient Prescriptions on File Prior to Visit  Medication Sig Dispense Refill  . albuterol (PROVENTIL HFA;VENTOLIN HFA) 108 (90 BASE) MCG/ACT inhaler Inhale 2 puffs into the lungs every 6 (six) hours as needed. For wheezing 1 Inhaler 0  . budesonide-formoterol (SYMBICORT) 80-4.5  MCG/ACT inhaler Inhale 2 puffs into the lungs 2 (two) times daily. Begin using this inhaler once you have completed the oral prednisone dosing schedule. 1 Inhaler 12  . cetirizine (ZYRTEC) 10 MG tablet Take 10 mg by mouth daily.    Marland Kitchen. escitalopram (LEXAPRO) 20 MG tablet Take 20 mg by mouth daily.    Marland Kitchen. ibuprofen (ADVIL,MOTRIN) 800 MG tablet Take 800 mg by mouth every 8 (eight) hours as needed. For migraine    . triamterene-hydrochlorothiazide (DYAZIDE) 37.5-25 MG per capsule Take 1 capsule by mouth every morning.     No current facility-administered medications on file prior to visit.    Allergies  Allergen Reactions  . Penicillins Rash    Review of Systems  Review of Systems  Constitutional: Positive for malaise/fatigue. Negative for fever and chills.  HENT: Negative for congestion, hearing loss and nosebleeds.   Eyes: Negative for discharge.  Respiratory: Negative for cough, sputum production, shortness of breath and wheezing.   Cardiovascular: Negative for chest pain, palpitations and leg swelling.  Gastrointestinal: Negative for heartburn, nausea, vomiting, abdominal pain, diarrhea, constipation and blood in stool.  Genitourinary: Negative for dysuria, urgency, frequency and hematuria.  Musculoskeletal: Negative for myalgias, back pain and falls.  Skin: Negative for rash.  Neurological: Positive for sensory change. Negative for dizziness, tremors, focal  weakness, loss of consciousness, weakness and headaches.       Notes some intermittent weakness and numbness on hands L>R  Endo/Heme/Allergies: Negative for polydipsia. Does not bruise/bleed easily.  Psychiatric/Behavioral: Negative for depression and suicidal ideas. The patient is not nervous/anxious.     Objective  BP 124/84 mmHg  Pulse 84  Temp(Src) 98.1 F (36.7 C) (Oral)  Ht  (1.651 m)  Wt 306 lb 8 oz (139.027 kg)  BMI 51.00 kg/m2  SpO2 94%  Physical Exam  Physical Exam  Constitutional: She is oriented to  person, place, and time and well-developed, well-nourished, and in no distress. No distress.  HENT:  Head: Normocephalic and atraumatic.  Right Ear: External ear normal.  Left Ear: External ear normal.  Nose: Nose normal.  Mouth/Throat: Oropharynx is clear and moist. No oropharyngeal exudate.  Eyes: Conjunctivae are normal. Pupils are equal, round, and reactive to light. Right eye exhibits no discharge. Left eye exhibits no discharge. No scleral icterus.  Neck: Normal range of motion. Neck supple. No thyromegaly present.  Cardiovascular: Normal rate, regular rhythm, normal heart sounds and intact distal pulses.   No murmur heard. Pulmonary/Chest: Effort normal and breath sounds normal. No respiratory distress. She has no wheezes. She has no rales.  Abdominal: Soft. Bowel sounds are normal. She exhibits no distension and no mass. There is no tenderness.  Musculoskeletal: Normal range of motion. She exhibits no edema or tenderness.  Lymphadenopathy:    She has no cervical adenopathy.  Neurological: She is alert and oriented to person, place, and time. She has normal reflexes. No cranial nerve deficit. Coordination normal.  Skin: Skin is warm and dry. No rash noted. She is not diaphoretic.  Psychiatric: Mood, memory and affect normal.       Assessment & Plan  HTN (hypertension) Well controlled, no changes to meds. Encouraged heart healthy diet such as the DASH diet and exercise as tolerated.    Obesity Encouraged DASH diet, decrease po intake and increase exercise as tolerated. Needs 7-8 hours of sleep nightly. Avoid trans fats, eat small, frequent meals every 4-5 hours with lean proteins, complex carbs and healthy fats. Minimize simple carbs, GMO foods.   Tachycardia Improved on recheck, encouraged to minimize caffeine and we will monitor   Depression Stable on Lexapro continue the  Same for now   Snoring Notes witnessed apnea episodes, snoring, restless sleep, fatigue, HTN  and obesity. Is referred to pulmonology for further consideration   Thoracic outlet syndrome Encouraged moist heat and gentle stretching of neck, try Salon Pas patches and gel and report if symptoms worsen   Edema Minimize sodium elevate feet above heart and try compression hose in am

## 2014-11-24 NOTE — Assessment & Plan Note (Signed)
Stable on Lexapro continue the  Same for now

## 2014-11-24 NOTE — Assessment & Plan Note (Signed)
Encouraged DASH diet, decrease po intake and increase exercise as tolerated. Needs 7-8 hours of sleep nightly. Avoid trans fats, eat small, frequent meals every 4-5 hours with lean proteins, complex carbs and healthy fats. Minimize simple carbs, GMO foods. 

## 2014-11-24 NOTE — Assessment & Plan Note (Signed)
Improved on recheck, encouraged to minimize caffeine and we will monitor

## 2014-11-24 NOTE — Assessment & Plan Note (Signed)
Notes witnessed apnea episodes, snoring, restless sleep, fatigue, HTN and obesity. Is referred to pulmonology for further consideration

## 2014-11-24 NOTE — Assessment & Plan Note (Signed)
Well controlled, no changes to meds. Encouraged heart healthy diet such as the DASH diet and exercise as tolerated.  °

## 2015-01-01 ENCOUNTER — Institutional Professional Consult (permissible substitution): Payer: 59 | Admitting: Pulmonary Disease

## 2015-01-16 ENCOUNTER — Telehealth: Payer: Self-pay | Admitting: Family Medicine

## 2015-01-16 MED ORDER — TRIAMTERENE-HCTZ 37.5-25 MG PO CAPS: 1.0000 | ORAL_CAPSULE | ORAL | Status: DC

## 2015-01-16 MED ORDER — ESCITALOPRAM OXALATE 20 MG PO TABS: 20.0000 mg | ORAL_TABLET | Freq: Every day | ORAL | Status: DC

## 2015-01-16 NOTE — Telephone Encounter (Signed)
OK to provide prescriptions for Lexapro and Maxzide, same strength, same sig, #90 with 1 rf or #30 with 5 rf at patient request

## 2015-01-16 NOTE — Telephone Encounter (Signed)
Prescriptions sent in as instructed by PCP.

## 2015-01-16 NOTE — Telephone Encounter (Signed)
Patient requesting a refill on Lexapro 20 mg and Triamterene/HCTZ 37.5/25 both PCP has never filled.   Last(only) office visit 11/14/14 Next office visit 06/25/15 Advise on refill

## 2015-02-02 ENCOUNTER — Other Ambulatory Visit: Payer: Self-pay | Admitting: Family Medicine

## 2015-02-02 MED ORDER — CETIRIZINE HCL 10 MG PO TABS: 10.0000 mg | ORAL_TABLET | Freq: Every day | ORAL | Status: DC

## 2015-03-02 ENCOUNTER — Other Ambulatory Visit: Payer: Self-pay | Admitting: Family Medicine

## 2015-03-02 MED ORDER — MONTELUKAST SODIUM 10 MG PO TABS: 10.0000 mg | ORAL_TABLET | Freq: Every day | ORAL | Status: DC

## 2015-04-23 ENCOUNTER — Other Ambulatory Visit: Payer: Self-pay | Admitting: Family Medicine

## 2015-04-23 MED ORDER — IBUPROFEN 800 MG PO TABS: 800.0000 mg | ORAL_TABLET | Freq: Three times a day (TID) | ORAL | Status: AC | PRN

## 2015-04-23 NOTE — Telephone Encounter (Signed)
Refill was approved by PCP and sent in to [facility]

## 2015-04-23 NOTE — Telephone Encounter (Signed)
Ok to refill ibuprofen as requested for patient, same strength, same sig, same number. No further refill til seen

## 2015-04-23 NOTE — Telephone Encounter (Signed)
Received call from [place] @ [facility] requesting refill on ibuprogen 800, take 1 by mouth q8h with food #270, was originally prescribed by different provider.

## 2015-04-30 ENCOUNTER — Encounter: Payer: 59 | Admitting: Family Medicine

## 2015-06-24 ENCOUNTER — Telehealth: Payer: Self-pay | Admitting: *Deleted

## 2015-06-24 NOTE — Telephone Encounter (Signed)
Unable to reach patient at time of Pre-Visit Call.  Left message for patient to return call when available.    

## 2015-06-25 ENCOUNTER — Encounter (INDEPENDENT_AMBULATORY_CARE_PROVIDER_SITE_OTHER): Payer: Self-pay

## 2015-06-25 ENCOUNTER — Other Ambulatory Visit (HOSPITAL_COMMUNITY)
Admission: RE | Admit: 2015-06-25 | Discharge: 2015-06-25 | Disposition: A | Discharge status: Home / Self Care | Payer: 59 | Source: Ambulatory Visit | Attending: Family Medicine | Admitting: Family Medicine

## 2015-06-25 ENCOUNTER — Other Ambulatory Visit: Payer: Self-pay | Admitting: Family Medicine

## 2015-06-25 ENCOUNTER — Encounter: Payer: Self-pay | Admitting: Family Medicine

## 2015-06-25 ENCOUNTER — Ambulatory Visit (INDEPENDENT_AMBULATORY_CARE_PROVIDER_SITE_OTHER): Payer: 59 | Admitting: Family Medicine

## 2015-06-25 VITALS — BP 128/70 | HR 72 | Temp 97.7°F | Ht 65.0 in | Wt 321.2 lb

## 2015-06-25 DIAGNOSIS — I10 Essential (primary) hypertension: Secondary | ICD-10-CM

## 2015-06-25 DIAGNOSIS — Z0000 Encounter for general adult medical examination without abnormal findings: Secondary | ICD-10-CM | POA: Diagnosis not present

## 2015-06-25 DIAGNOSIS — Z124 Encounter for screening for malignant neoplasm of cervix: Secondary | ICD-10-CM

## 2015-06-25 DIAGNOSIS — Z1239 Encounter for other screening for malignant neoplasm of breast: Secondary | ICD-10-CM | POA: Diagnosis not present

## 2015-06-25 DIAGNOSIS — R0683 Snoring: Secondary | ICD-10-CM

## 2015-06-25 DIAGNOSIS — Z01419 Encounter for gynecological examination (general) (routine) without abnormal findings: Secondary | ICD-10-CM | POA: Insufficient documentation

## 2015-06-25 DIAGNOSIS — Z1231 Encounter for screening mammogram for malignant neoplasm of breast: Secondary | ICD-10-CM

## 2015-06-25 DIAGNOSIS — R000 Tachycardia, unspecified: Secondary | ICD-10-CM | POA: Diagnosis not present

## 2015-06-25 DIAGNOSIS — E876 Hypokalemia: Secondary | ICD-10-CM | POA: Diagnosis not present

## 2015-06-25 DIAGNOSIS — E669 Obesity, unspecified: Secondary | ICD-10-CM

## 2015-06-25 HISTORY — DX: Encounter for screening for malignant neoplasm of cervix: Z12.4

## 2015-06-25 LAB — CBC
HCT: 42.3 % (ref 36.0–46.0)
Hemoglobin: 14.1 g/dL (ref 12.0–15.0)
MCHC: 33.3 g/dL (ref 30.0–36.0)
MCV: 87.4 fl (ref 78.0–100.0)
PLATELETS: 367 10*3/uL (ref 150.0–400.0)
RBC: 4.84 Mil/uL (ref 3.87–5.11)
RDW: 13.1 % (ref 11.5–15.5)
WBC: 9.2 10*3/uL (ref 4.0–10.5)

## 2015-06-25 LAB — COMPREHENSIVE METABOLIC PANEL
ALK PHOS: 103 U/L (ref 39–117)
ALT: 24 U/L (ref 0–35)
AST: 23 U/L (ref 0–37)
Albumin: 3.6 g/dL (ref 3.5–5.2)
BILIRUBIN TOTAL: 0.5 mg/dL (ref 0.2–1.2)
BUN: 12 mg/dL (ref 6–23)
CALCIUM: 9 mg/dL (ref 8.4–10.5)
CO2: 30 meq/L (ref 19–32)
CREATININE: 0.77 mg/dL (ref 0.40–1.20)
Chloride: 101 mEq/L (ref 96–112)
GFR: 83.66 mL/min (ref >60.00)
GLUCOSE: 94 mg/dL (ref 70–99)
Potassium: 3.8 mEq/L (ref 3.5–5.1)
Sodium: 138 mEq/L (ref 135–145)
TOTAL PROTEIN: 7.4 g/dL (ref 6.0–8.3)

## 2015-06-25 LAB — LIPID PANEL
CHOL/HDL RATIO: 3
Cholesterol: 169 mg/dL (ref 0–200)
HDL: 64.8 mg/dL (ref >39.00)
LDL Cholesterol: 89 mg/dL (ref 0–99)
NONHDL: 104.2
Triglycerides: 77 mg/dL (ref 0.0–149.0)
VLDL: 15.4 mg/dL (ref 0.0–40.0)

## 2015-06-25 LAB — TSH: TSH: 2.33 u[IU]/mL (ref 0.35–4.50)

## 2015-06-25 NOTE — Patient Instructions (Addendum)
Consider a daily Vitamin D 2,000-5,000 IU daily to help with bone health [place] has great products that can be found on line at [email or web address] or in some vitamin stores  Preventive Care for Adults A healthy lifestyle and preventive care can promote health and wellness. Preventive health guidelines for women include the following key practices.  A routine yearly physical is a good way to check with your health care provider about your health and preventive screening. It is a chance to share any concerns and updates on your health and to receive a thorough exam.  Visit your dentist for a routine exam and preventive care every 6 months. Brush your teeth twice a day and floss once a day. Good oral hygiene prevents tooth decay and gum disease.  The frequency of eye exams is based on your age, health, family medical history, use of contact lenses, and other factors. Follow your health care provider's recommendations for frequency of eye exams.  Eat a healthy diet. Foods like vegetables, fruits, whole grains, low-fat dairy products, and lean protein foods contain the nutrients you need without too many calories. Decrease your intake of foods high in solid fats, added sugars, and salt. Eat the right amount of calories for you.Get information about a proper diet from your health care provider, if necessary.  Regular physical exercise is one of the most important things you can do for your health. Most adults should get at least 150 minutes of moderate-intensity exercise (any activity that increases your heart rate and causes you to sweat) each week. In addition, most adults need muscle-strengthening exercises on 2 or more days a week.  Maintain a healthy weight. The body mass index (BMI) is a screening tool to identify possible weight problems. It provides an estimate of body fat based on height and weight. Your health care provider can find your BMI and can help you achieve or maintain a healthy  weight.For adults 20 years and older:  A BMI below 18.5 is considered underweight.  A BMI of 18.5 to 24.9 is normal.  A BMI of 25 to 29.9 is considered overweight.  A BMI of 30 and above is considered obese.  Maintain normal blood lipids and cholesterol levels by exercising and minimizing your intake of saturated fat. Eat a balanced diet with plenty of fruit and vegetables. Blood tests for lipids and cholesterol should begin at age 68 and be repeated every 5 years. If your lipid or cholesterol levels are high, you are over 50, or you are at high risk for heart disease, you may need your cholesterol levels checked more frequently.Ongoing high lipid and cholesterol levels should be treated with medicines if diet and exercise are not working.  If you smoke, find out from your health care provider how to quit. If you do not use tobacco, do not start.  Lung cancer screening is recommended for adults aged 87-80 years who are at high risk for developing lung cancer because of a history of smoking. A yearly low-dose CT scan of the lungs is recommended for people who have at least a 30-pack-year history of smoking and are a current smoker or have quit within the past 15 years. A pack year of smoking is smoking an average of 1 pack of cigarettes a day for 1 year (for example: 1 pack a day for 30 years or 2 packs a day for 15 years). Yearly screening should continue until the smoker has stopped smoking for at least 15 years. Yearly screening  should be stopped for people who develop a health problem that would prevent them from having lung cancer treatment.  If you are pregnant, do not drink alcohol. If you are breastfeeding, be very cautious about drinking alcohol. If you are not pregnant and choose to drink alcohol, do not have more than 1 drink per day. One drink is considered to be 12 ounces (355 mL) of beer, 5 ounces (148 mL) of wine, or 1.5 ounces (44 mL) of liquor.  Avoid use of street drugs. Do not  share needles with anyone. Ask for help if you need support or instructions about stopping the use of drugs.  High blood pressure causes heart disease and increases the risk of stroke. Your blood pressure should be checked at least every 1 to 2 years. Ongoing high blood pressure should be treated with medicines if weight loss and exercise do not work.  If you are 37-60 years old, ask your health care provider if you should take aspirin to prevent strokes.  Diabetes screening involves taking a blood sample to check your fasting blood sugar level. This should be done once every 3 years, after age 68, if you are within normal weight and without risk factors for diabetes. Testing should be considered at a younger age or be carried out more frequently if you are overweight and have at least 1 risk factor for diabetes.  Breast cancer screening is essential preventive care for women. You should practice "breast self-awareness." This means understanding the normal appearance and feel of your breasts and may include breast self-examination. Any changes detected, no matter how small, should be reported to a health care provider. Women in their 72s and 30s should have a clinical breast exam (CBE) by a health care provider as part of a regular health exam every 1 to 3 years. After age 17, women should have a CBE every year. Starting at age 65, women should consider having a mammogram (breast X-ray test) every year. Women who have a family history of breast cancer should talk to their health care provider about genetic screening. Women at a high risk of breast cancer should talk to their health care providers about having an MRI and a mammogram every year.  Breast cancer gene (BRCA)-related cancer risk assessment is recommended for women who have family members with BRCA-related cancers. BRCA-related cancers include breast, ovarian, tubal, and peritoneal cancers. Having family members with these cancers may be  associated with an increased risk for harmful changes (mutations) in the breast cancer genes BRCA1 and BRCA2. Results of the assessment will determine the need for genetic counseling and BRCA1 and BRCA2 testing.  Routine pelvic exams to screen for cancer are no longer recommended for nonpregnant women who are considered low risk for cancer of the pelvic organs (ovaries, uterus, and vagina) and who do not have symptoms. Ask your health care provider if a screening pelvic exam is right for you.  If you have had past treatment for cervical cancer or a condition that could lead to cancer, you need Pap tests and screening for cancer for at least 20 years after your treatment. If Pap tests have been discontinued, your risk factors (such as having a new sexual partner) need to be reassessed to determine if screening should be resumed. Some women have medical problems that increase the chance of getting cervical cancer. In these cases, your health care provider may recommend more frequent screening and Pap tests.  The HPV test is an additional test that  may be used for cervical cancer screening. The HPV test looks for the virus that can cause the cell changes on the cervix. The cells collected during the Pap test can be tested for HPV. The HPV test could be used to screen women aged 67 years and older, and should be used in women of any age who have unclear Pap test results. After the age of 36, women should have HPV testing at the same frequency as a Pap test.  Colorectal cancer can be detected and often prevented. Most routine colorectal cancer screening begins at the age of 85 years and continues through age 44 years. However, your health care provider may recommend screening at an earlier age if you have risk factors for colon cancer. On a yearly basis, your health care provider may provide home test kits to check for hidden blood in the stool. Use of a small camera at the end of a tube, to directly examine the  colon (sigmoidoscopy or colonoscopy), can detect the earliest forms of colorectal cancer. Talk to your health care provider about this at age 7, when routine screening begins. Direct exam of the colon should be repeated every 5-10 years through age 44 years, unless early forms of pre-cancerous polyps or small growths are found.  People who are at an increased risk for hepatitis B should be screened for this virus. You are considered at high risk for hepatitis B if:  You were born in a country where hepatitis B occurs often. Talk with your health care provider about which countries are considered high risk.  Your parents were born in a high-risk country and you have not received a shot to protect against hepatitis B (hepatitis B vaccine).  You have HIV or AIDS.  You use needles to inject street drugs.  You live with, or have sex with, someone who has hepatitis B.  You get hemodialysis treatment.  You take certain medicines for conditions like cancer, organ transplantation, and autoimmune conditions.  Hepatitis C blood testing is recommended for all people born from [birth year] through 1965 and any individual with known risks for hepatitis C.  Practice safe sex. Use condoms and avoid high-risk sexual practices to reduce the spread of sexually transmitted infections (STIs). STIs include gonorrhea, chlamydia, syphilis, trichomonas, herpes, HPV, and human immunodeficiency virus (HIV). Herpes, HIV, and HPV are viral illnesses that have no cure. They can result in disability, cancer, and death.  You should be screened for sexually transmitted illnesses (STIs) including gonorrhea and chlamydia if:  You are sexually active and are younger than 24 years.  You are older than 24 years and your health care provider tells you that you are at risk for this type of infection.  Your sexual activity has changed since you were last screened and you are at an increased risk for chlamydia or gonorrhea. Ask your  health care provider if you are at risk.  If you are at risk of being infected with HIV, it is recommended that you take a prescription medicine daily to prevent HIV infection. This is called preexposure prophylaxis (PrEP). You are considered at risk if:  You are a heterosexual woman, are sexually active, and are at increased risk for HIV infection.  You take drugs by injection.  You are sexually active with a partner who has HIV.  Talk with your health care provider about whether you are at high risk of being infected with HIV. If you choose to begin PrEP, you should first be  tested for HIV. You should then be tested every 3 months for as long as you are taking PrEP.  Osteoporosis is a disease in which the bones lose minerals and strength with aging. This can result in serious bone fractures or breaks. The risk of osteoporosis can be identified using a bone density scan. Women ages 25 years and over and women at risk for fractures or osteoporosis should discuss screening with their health care providers. Ask your health care provider whether you should take a calcium supplement or vitamin D to reduce the rate of osteoporosis.  Menopause can be associated with physical symptoms and risks. Hormone replacement therapy is available to decrease symptoms and risks. You should talk to your health care provider about whether hormone replacement therapy is right for you.  Use sunscreen. Apply sunscreen liberally and repeatedly throughout the day. You should seek shade when your shadow is shorter than you. Protect yourself by wearing long sleeves, pants, a wide-brimmed hat, and sunglasses year round, whenever you are outdoors.  Once a month, do a whole body skin exam, using a mirror to look at the skin on your back. Tell your health care provider of new moles, moles that have irregular borders, moles that are larger than a pencil eraser, or moles that have changed in shape or color.  Stay current with  required vaccines (immunizations).  Influenza vaccine. All adults should be immunized every year.  Tetanus, diphtheria, and acellular pertussis (Td, Tdap) vaccine. Pregnant women should receive 1 dose of Tdap vaccine during each pregnancy. The dose should be obtained regardless of the length of time since the last dose. Immunization is preferred during the 27th-36th week of gestation. An adult who has not previously received Tdap or who does not know her vaccine status should receive 1 dose of Tdap. This initial dose should be followed by tetanus and diphtheria toxoids (Td) booster doses every 10 years. Adults with an unknown or incomplete history of completing a 3-dose immunization series with Td-containing vaccines should begin or complete a primary immunization series including a Tdap dose. Adults should receive a Td booster every 10 years.  Varicella vaccine. An adult without evidence of immunity to varicella should receive 2 doses or a second dose if she has previously received 1 dose. Pregnant females who do not have evidence of immunity should receive the first dose after pregnancy. This first dose should be obtained before leaving the health care facility. The second dose should be obtained 4-8 weeks after the first dose.  Human papillomavirus (HPV) vaccine. Females aged 13-26 years who have not received the vaccine previously should obtain the 3-dose series. The vaccine is not recommended for use in pregnant females. However, pregnancy testing is not needed before receiving a dose. If a female is found to be pregnant after receiving a dose, no treatment is needed. In that case, the remaining doses should be delayed until after the pregnancy. Immunization is recommended for any person with an immunocompromised condition through the age of 45 years if she did not get any or all doses earlier. During the 3-dose series, the second dose should be obtained 4-8 weeks after the first dose. The third dose  should be obtained 24 weeks after the first dose and 16 weeks after the second dose.  Zoster vaccine. One dose is recommended for adults aged 30 years or older unless certain conditions are present.  Measles, mumps, and rubella (MMR) vaccine. Adults born before 60 generally are considered immune to measles and  mumps. Adults born in [birth year] or later should have 1 or more doses of MMR vaccine unless there is a contraindication to the vaccine or there is laboratory evidence of immunity to each of the three diseases. A routine second dose of MMR vaccine should be obtained at least 28 days after the first dose for students attending postsecondary schools, health care workers, or international travelers. People who received inactivated measles vaccine or an unknown type of measles vaccine during 1963-1967 should receive 2 doses of MMR vaccine. People who received inactivated mumps vaccine or an unknown type of mumps vaccine before 1979 and are at high risk for mumps infection should consider immunization with 2 doses of MMR vaccine. For females of childbearing age, rubella immunity should be determined. If there is no evidence of immunity, females who are not pregnant should be vaccinated. If there is no evidence of immunity, females who are pregnant should delay immunization until after pregnancy. Unvaccinated health care workers born before [birth year] who lack laboratory evidence of measles, mumps, or rubella immunity or laboratory confirmation of disease should consider measles and mumps immunization with 2 doses of MMR vaccine or rubella immunization with 1 dose of MMR vaccine.  Pneumococcal 13-valent conjugate (PCV13) vaccine. When indicated, a person who is uncertain of her immunization history and has no record of immunization should receive the PCV13 vaccine. An adult aged 56 years or older who has certain medical conditions and has not been previously immunized should receive 1 dose of PCV13 vaccine. This PCV13  should be followed with a dose of pneumococcal polysaccharide (PPSV23) vaccine. The PPSV23 vaccine dose should be obtained at least 8 weeks after the dose of PCV13 vaccine. An adult aged 74 years or older who has certain medical conditions and previously received 1 or more doses of PPSV23 vaccine should receive 1 dose of PCV13. The PCV13 vaccine dose should be obtained 1 or more years after the last PPSV23 vaccine dose.  Pneumococcal polysaccharide (PPSV23) vaccine. When PCV13 is also indicated, PCV13 should be obtained first. All adults aged 24 years and older should be immunized. An adult younger than age 32 years who has certain medical conditions should be immunized. Any person who resides in a nursing home or long-term care facility should be immunized. An adult smoker should be immunized. People with an immunocompromised condition and certain other conditions should receive both PCV13 and PPSV23 vaccines. People with human immunodeficiency virus (HIV) infection should be immunized as soon as possible after diagnosis. Immunization during chemotherapy or radiation therapy should be avoided. Routine use of PPSV23 vaccine is not recommended for American Indians, [place] Natives, or people younger than 65 years unless there are medical conditions that require PPSV23 vaccine. When indicated, people who have unknown immunization and have no record of immunization should receive PPSV23 vaccine. One-time revaccination 5 years after the first dose of PPSV23 is recommended for people aged 19-64 years who have chronic kidney failure, nephrotic syndrome, asplenia, or immunocompromised conditions. People who received 1-2 doses of PPSV23 before age 53 years should receive another dose of PPSV23 vaccine at age 45 years or later if at least 5 years have passed since the previous dose. Doses of PPSV23 are not needed for people immunized with PPSV23 at or after age 45 years.  Meningococcal vaccine. Adults with asplenia or  persistent complement component deficiencies should receive 2 doses of quadrivalent meningococcal conjugate (MenACWY-D) vaccine. The doses should be obtained at least 2 months apart. Microbiologists working with certain meningococcal bacteria, [employer]  recruits, people at risk during an outbreak, and people who travel to or live in countries with a high rate of meningitis should be immunized. A first-year college student up through age 3 years who is living in a residence hall should receive a dose if she did not receive a dose on or after her 16th birthday. Adults who have certain high-risk conditions should receive one or more doses of vaccine.  Hepatitis A vaccine. Adults who wish to be protected from this disease, have certain high-risk conditions, work with hepatitis A-infected animals, work in hepatitis A research labs, or travel to or work in countries with a high rate of hepatitis A should be immunized. Adults who were previously unvaccinated and who anticipate close contact with an international adoptee during the first 60 days after arrival in the [place] from a country with a high rate of hepatitis A should be immunized.  Hepatitis B vaccine. Adults who wish to be protected from this disease, have certain high-risk conditions, may be exposed to blood or other infectious body fluids, are household contacts or sex partners of hepatitis B positive people, are clients or workers in certain care facilities, or travel to or work in countries with a high rate of hepatitis B should be immunized.  Haemophilus influenzae type b (Hib) vaccine. A previously unvaccinated person with asplenia or sickle cell disease or having a scheduled splenectomy should receive 1 dose of Hib vaccine. Regardless of previous immunization, a recipient of a hematopoietic stem cell transplant should receive a 3-dose series 6-12 months after her successful transplant. Hib vaccine is not recommended for adults with HIV  infection. Preventive Services / Frequency Ages 93 to 67 years  Blood pressure check.** / Every 1 to 2 years.  Lipid and cholesterol check.** / Every 5 years beginning at age 50.  Clinical breast exam.** / Every 3 years for women in their 83s and 89s.  BRCA-related cancer risk assessment.** / For women who have family members with a BRCA-related cancer (breast, ovarian, tubal, or peritoneal cancers).  Pap test.** / Every 2 years from ages 64 through 35. Every 3 years starting at age 61 through age 96 or 73 with a history of 3 consecutive normal Pap tests.  HPV screening.** / Every 3 years from ages 94 through ages 65 to 82 with a history of 3 consecutive normal Pap tests.  Hepatitis C blood test.** / For any individual with known risks for hepatitis C.  Skin self-exam. / Monthly.  Influenza vaccine. / Every year.  Tetanus, diphtheria, and acellular pertussis (Tdap, Td) vaccine.** / Consult your health care provider. Pregnant women should receive 1 dose of Tdap vaccine during each pregnancy. 1 dose of Td every 10 years.  Varicella vaccine.** / Consult your health care provider. Pregnant females who do not have evidence of immunity should receive the first dose after pregnancy.  HPV vaccine. / 3 doses over 6 months, if 43 and younger. The vaccine is not recommended for use in pregnant females. However, pregnancy testing is not needed before receiving a dose.  Measles, mumps, rubella (MMR) vaccine.** / You need at least 1 dose of MMR if you were born in 1957 or later. You may also need a 2nd dose. For females of childbearing age, rubella immunity should be determined. If there is no evidence of immunity, females who are not pregnant should be vaccinated. If there is no evidence of immunity, females who are pregnant should delay immunization until after pregnancy.  Pneumococcal 13-valent  conjugate (PCV13) vaccine.** / Consult your health care provider.  Pneumococcal polysaccharide  (PPSV23) vaccine.** / 1 to 2 doses if you smoke cigarettes or if you have certain conditions.  Meningococcal vaccine.** / 1 dose if you are age 35 to 59 years and a Market researcher living in a residence hall, or have one of several medical conditions, you need to get vaccinated against meningococcal disease. You may also need additional booster doses.  Hepatitis A vaccine.** / Consult your health care provider.  Hepatitis B vaccine.** / Consult your health care provider.  Haemophilus influenzae type b (Hib) vaccine.** / Consult your health care provider. Ages 3 to 26 years  Blood pressure check.** / Every 1 to 2 years.  Lipid and cholesterol check.** / Every 5 years beginning at age 8 years.  Lung cancer screening. / Every year if you are aged 110-80 years and have a 30-pack-year history of smoking and currently smoke or have quit within the past 15 years. Yearly screening is stopped once you have quit smoking for at least 15 years or develop a health problem that would prevent you from having lung cancer treatment.  Clinical breast exam.** / Every year after age 26 years.  BRCA-related cancer risk assessment.** / For women who have family members with a BRCA-related cancer (breast, ovarian, tubal, or peritoneal cancers).  Mammogram.** / Every year beginning at age 5 years and continuing for as long as you are in good health. Consult with your health care provider.  Pap test.** / Every 3 years starting at age 20 years through age 90 or 76 years with a history of 3 consecutive normal Pap tests.  HPV screening.** / Every 3 years from ages 31 years through ages 11 to 40 years with a history of 3 consecutive normal Pap tests.  Fecal occult blood test (FOBT) of stool. / Every year beginning at age 30 years and continuing until age 2 years. You may not need to do this test if you get a colonoscopy every 10 years.  Flexible sigmoidoscopy or colonoscopy.** / Every 5 years for a  flexible sigmoidoscopy or every 10 years for a colonoscopy beginning at age 74 years and continuing until age 32 years.  Hepatitis C blood test.** / For all people born from [birth year] through 1963 and any individual with known risks for hepatitis C.  Skin self-exam. / Monthly.  Influenza vaccine. / Every year.  Tetanus, diphtheria, and acellular pertussis (Tdap/Td) vaccine.** / Consult your health care provider. Pregnant women should receive 1 dose of Tdap vaccine during each pregnancy. 1 dose of Td every 10 years.  Varicella vaccine.** / Consult your health care provider. Pregnant females who do not have evidence of immunity should receive the first dose after pregnancy.  Zoster vaccine.** / 1 dose for adults aged 104 years or older.  Measles, mumps, rubella (MMR) vaccine.** / You need at least 1 dose of MMR if you were born in 1955 or later. You may also need a 2nd dose. For females of childbearing age, rubella immunity should be determined. If there is no evidence of immunity, females who are not pregnant should be vaccinated. If there is no evidence of immunity, females who are pregnant should delay immunization until after pregnancy.  Pneumococcal 13-valent conjugate (PCV13) vaccine.** / Consult your health care provider.  Pneumococcal polysaccharide (PPSV23) vaccine.** / 1 to 2 doses if you smoke cigarettes or if you have certain conditions.  Meningococcal vaccine.** / Consult your health care provider.  Hepatitis  A vaccine.** / Consult your health care provider.  Hepatitis B vaccine.** / Consult your health care provider.  Haemophilus influenzae type b (Hib) vaccine.** / Consult your health care provider. Ages 7 years and over  Blood pressure check.** / Every 1 to 2 years.  Lipid and cholesterol check.** / Every 5 years beginning at age 46 years.  Lung cancer screening. / Every year if you are aged 38-80 years and have a 30-pack-year history of smoking and currently smoke or have  quit within the past 15 years. Yearly screening is stopped once you have quit smoking for at least 15 years or develop a health problem that would prevent you from having lung cancer treatment.  Clinical breast exam.** / Every year after age 13 years.  BRCA-related cancer risk assessment.** / For women who have family members with a BRCA-related cancer (breast, ovarian, tubal, or peritoneal cancers).  Mammogram.** / Every year beginning at age 35 years and continuing for as long as you are in good health. Consult with your health care provider.  Pap test.** / Every 3 years starting at age 63 years through age 85 or 17 years with 3 consecutive normal Pap tests. Testing can be stopped between 65 and 70 years with 3 consecutive normal Pap tests and no abnormal Pap or HPV tests in the past 10 years.  HPV screening.** / Every 3 years from ages 58 years through ages 9 or 68 years with a history of 3 consecutive normal Pap tests. Testing can be stopped between 65 and 70 years with 3 consecutive normal Pap tests and no abnormal Pap or HPV tests in the past 10 years.  Fecal occult blood test (FOBT) of stool. / Every year beginning at age 35 years and continuing until age 33 years. You may not need to do this test if you get a colonoscopy every 10 years.  Flexible sigmoidoscopy or colonoscopy.** / Every 5 years for a flexible sigmoidoscopy or every 10 years for a colonoscopy beginning at age 81 years and continuing until age 37 years.  Hepatitis C blood test.** / For all people born from [birth year] through 1963 and any individual with known risks for hepatitis C.  Osteoporosis screening.** / A one-time screening for women ages 90 years and over and women at risk for fractures or osteoporosis.  Skin self-exam. / Monthly.  Influenza vaccine. / Every year.  Tetanus, diphtheria, and acellular pertussis (Tdap/Td) vaccine.** / 1 dose of Td every 10 years.  Varicella vaccine.** / Consult your health care  provider.  Zoster vaccine.** / 1 dose for adults aged 64 years or older.  Pneumococcal 13-valent conjugate (PCV13) vaccine.** / Consult your health care provider.  Pneumococcal polysaccharide (PPSV23) vaccine.** / 1 dose for all adults aged 13 years and older.  Meningococcal vaccine.** / Consult your health care provider.  Hepatitis A vaccine.** / Consult your health care provider.  Hepatitis B vaccine.** / Consult your health care provider.  Haemophilus influenzae type b (Hib) vaccine.** / Consult your health care provider. ** Family history and personal history of risk and conditions may change your health care provider's recommendations. Document Released: 11/08/2001 Document Revised: 01/27/2014 Document Reviewed: 02/07/2011 [facility] Patient Information 2015 [place], [place]. This information is not intended to replace advice given to you by your health care provider. Make sure you discuss any questions you have with your health care provider.

## 2015-06-25 NOTE — Progress Notes (Signed)
Patient ID: Pamela Solis, female   DOB: May 16, 1961, 54 y.o.   MRN: [id number]   Subjective:    Patient ID: Pamela Solis, female    DOB: 05/26/1961, 54 y.o.   MRN: [id number]  Chief Complaint  Patient presents with  . Annual Exam    HPI Patient is in today for annual exam. Feels well today. No recent illness. Is here today for Pap smear and denies any GYN complaints. Is no longer having regular menstrual cycles. Denies CP/palp/SOB/HA/congestion/fevers/GI or GU c/o. Taking meds as prescribed  Past Medical History  Diagnosis Date  . Asthma   . Hypertension   . Depression   . Allergy   . Obesity   . Anemia   . Chronic kidney disease   . Snoring 11/24/2014  . Edema 11/24/2014  . Thoracic outlet syndrome 11/24/2014  . Cervical cancer screening 06/25/2015    Past Surgical History  Procedure Laterality Date  . Cesarean section    . Tubal ligation    . Endometrial ablation  2011    Family History  Problem Relation Age of Onset  . Obesity Mother   . Arthritis Mother     rheumatoid  . Stroke Father     Social History   Social History  . Marital Status: Married    Spouse Name: N/A  . Number of Children: N/A  . Years of Education: N/A   Occupational History  . Not on file.   Social History Main Topics  . Smoking status: Never Smoker   . Smokeless tobacco: Not on file  . Alcohol Use: No  . Drug Use: No  . Sexual Activity: Not on file     Comment: lives with husband and daughters, works for [employer] no dietary restrictions   Other Topics Concern  . Not on file   Social History Narrative    Outpatient Prescriptions Prior to Visit  Medication Sig Dispense Refill  . albuterol (PROVENTIL HFA;VENTOLIN HFA) 108 (90 BASE) MCG/ACT inhaler Inhale 2 puffs into the lungs every 6 (six) hours as needed. For wheezing 1 Inhaler 0  . budesonide-formoterol (SYMBICORT) 80-4.5 MCG/ACT inhaler Inhale 2 puffs into the lungs 2 (two) times daily. Begin using this inhaler once you have  completed the oral prednisone dosing schedule. 1 Inhaler 12  . cetirizine (ZYRTEC) 10 MG tablet Take 1 tablet (10 mg total) by mouth daily. 30 tablet 6  . escitalopram (LEXAPRO) 20 MG tablet Take 1 tablet (20 mg total) by mouth daily. 30 tablet 5  . ibuprofen (ADVIL,MOTRIN) 800 MG tablet Take 1 tablet (800 mg total) by mouth every 8 (eight) hours as needed. For migraine 270 tablet 0  . montelukast (SINGULAIR) 10 MG tablet Take 1 tablet (10 mg total) by mouth daily. 30 tablet 6  . triamterene-hydrochlorothiazide (DYAZIDE) 37.5-25 MG per capsule Take 1 each (1 capsule total) by mouth every morning. 30 capsule 5  . fluticasone (FLONASE) 50 MCG/ACT nasal spray Place 2 sprays into both nostrils daily as needed for allergies or rhinitis. (Patient not taking: Reported on 06/25/2015) 16 g 6   No facility-administered medications prior to visit.    Allergies  Allergen Reactions  . Penicillins Rash    Review of Systems  Constitutional: Negative for fever and malaise/fatigue.  HENT: Negative for congestion.   Eyes: Negative for discharge.  Respiratory: Negative for shortness of breath.   Cardiovascular: Negative for chest pain, palpitations and leg swelling.  Gastrointestinal: Negative for nausea and abdominal pain.  Genitourinary: Negative for  dysuria.  Musculoskeletal: Negative for falls.  Skin: Negative for rash.  Neurological: Negative for loss of consciousness and headaches.  Endo/Heme/Allergies: Negative for environmental allergies.  Psychiatric/Behavioral: Negative for depression. The patient is not nervous/anxious.        Objective:    Physical Exam  Constitutional: She is oriented to person, place, and time. She appears well-developed and well-nourished. No distress.  HENT:  Head: Normocephalic and atraumatic.  Eyes: Conjunctivae are normal.  Neck: Neck supple. No thyromegaly present.  Cardiovascular: Normal rate, regular rhythm and normal heart sounds.   No murmur  heard. Pulmonary/Chest: Effort normal and breath sounds normal. No respiratory distress.  Abdominal: Soft. Bowel sounds are normal. She exhibits no distension and no mass. There is no tenderness.  Genitourinary: Vagina normal and uterus normal. No vaginal discharge found.  Breast exam no discharge, lesions or masses  Musculoskeletal: She exhibits no edema.  Lymphadenopathy:    She has no cervical adenopathy.  Neurological: She is alert and oriented to person, place, and time.  Skin: Skin is warm and dry.  Psychiatric: She has a normal mood and affect. Her behavior is normal.    BP 128/70 mmHg  Pulse 72  Temp(Src) 97.7 F (36.5 C) (Oral)  Ht  (1.651 m)  Wt 321 lb 4 oz (145.718 kg)  BMI 53.46 kg/m2  SpO2 94% Wt Readings from Last 3 Encounters:  06/25/15 321 lb 4 oz (145.718 kg)  11/14/14 306 lb 8 oz (139.027 kg)  01/24/12 294 lb 15.6 oz (133.8 kg)     Lab Results  Component Value Date   WBC 9.2 06/25/2015   HGB 14.1 06/25/2015   HCT 42.3 06/25/2015   PLT 367.0 06/25/2015   GLUCOSE 94 06/25/2015   CHOL 169 06/25/2015   TRIG 77.0 06/25/2015   HDL 64.80 06/25/2015   LDLCALC 89 06/25/2015   ALT 24 06/25/2015   AST 23 06/25/2015   NA 138 06/25/2015   K 3.8 06/25/2015   CL 101 06/25/2015   CREATININE 0.77 06/25/2015   BUN 12 06/25/2015   CO2 30 06/25/2015   TSH 2.33 06/25/2015   HGBA1C 5.7* 01/26/2012    Lab Results  Component Value Date   TSH 2.33 06/25/2015   Lab Results  Component Value Date   WBC 9.2 06/25/2015   HGB 14.1 06/25/2015   HCT 42.3 06/25/2015   MCV 87.4 06/25/2015   PLT 367.0 06/25/2015   Lab Results  Component Value Date   NA 138 06/25/2015   K 3.8 06/25/2015   CO2 30 06/25/2015   GLUCOSE 94 06/25/2015   BUN 12 06/25/2015   CREATININE 0.77 06/25/2015   BILITOT 0.5 06/25/2015   ALKPHOS 103 06/25/2015   AST 23 06/25/2015   ALT 24 06/25/2015   PROT 7.4 06/25/2015   ALBUMIN 3.6 06/25/2015   CALCIUM 9.0 06/25/2015   GFR 83.66  06/25/2015   Lab Results  Component Value Date   CHOL 169 06/25/2015   Lab Results  Component Value Date   HDL 64.80 06/25/2015   Lab Results  Component Value Date   LDLCALC 89 06/25/2015   Lab Results  Component Value Date   TRIG 77.0 06/25/2015   Lab Results  Component Value Date   CHOLHDL 3 06/25/2015   Lab Results  Component Value Date   HGBA1C 5.7* 01/26/2012       Assessment & Plan:   Problem List Items Addressed This Visit    Tachycardia    RRR today  Snoring    Restless sleep, fatigue, hypertension and snoring, referred to pulmonology for consideration of sleep study       Relevant Orders   Ambulatory referral to Pulmonology   Obesity    Encouraged DASH diet, decrease po intake and increase exercise as tolerated. Needs 7-8 hours of sleep nightly. Avoid trans fats, eat small, frequent meals every 4-5 hours with lean proteins, complex carbs and healthy fats. Minimize simple carbs, GMO foods.      Hypokalemia   Relevant Orders   Comprehensive metabolic panel (Completed)   HTN (hypertension)    Well controlled, no changes to meds. Encouraged heart healthy diet such as the DASH diet and exercise as tolerated.       Relevant Orders   CBC (Completed)   Comprehensive metabolic panel (Completed)   TSH (Completed)   Cervical cancer screening    Pap today, no concerns on exam.        Other Visit Diagnoses    Preventative health care    -  Primary    Relevant Orders    CBC (Completed)    Comprehensive metabolic panel (Completed)    Lipid panel (Completed)    TSH (Completed)    Hepatitis C antibody (Completed)    Breast cancer screening        Relevant Orders    MM DIGITAL SCREENING BILATERAL       I am having Ms. Molla maintain her budesonide-formoterol, albuterol, fluticasone, triamterene-hydrochlorothiazide, escitalopram, cetirizine, montelukast, and ibuprofen.  No orders of the defined types were placed in this encounter.     [name], MD

## 2015-06-25 NOTE — Assessment & Plan Note (Signed)
Pap today, no concerns on exam.  

## 2015-06-25 NOTE — Progress Notes (Signed)
Pre visit review using our clinic review tool, if applicable. No additional management support is needed unless otherwise documented below in the visit note. 

## 2015-06-26 LAB — CYTOLOGY - PAP

## 2015-06-26 LAB — HEPATITIS C ANTIBODY: HCV AB: NEGATIVE

## 2015-07-05 ENCOUNTER — Encounter: Payer: Self-pay | Admitting: Family Medicine

## 2015-07-05 NOTE — Assessment & Plan Note (Signed)
Encouraged DASH diet, decrease po intake and increase exercise as tolerated. Needs 7-8 hours of sleep nightly. Avoid trans fats, eat small, frequent meals every 4-5 hours with lean proteins, complex carbs and healthy fats. Minimize simple carbs, GMO foods. 

## 2015-07-05 NOTE — Assessment & Plan Note (Signed)
RRR today 

## 2015-07-05 NOTE — Assessment & Plan Note (Signed)
Well controlled, no changes to meds. Encouraged heart healthy diet such as the DASH diet and exercise as tolerated.  °

## 2015-07-05 NOTE — Assessment & Plan Note (Signed)
Restless sleep, fatigue, hypertension and snoring, referred to pulmonology for consideration of sleep study

## 2015-07-07 ENCOUNTER — Ambulatory Visit (HOSPITAL_BASED_OUTPATIENT_CLINIC_OR_DEPARTMENT_OTHER)
Admission: RE | Admit: 2015-07-07 | Discharge: 2015-07-07 | Disposition: A | Discharge status: Home / Self Care | Payer: 59 | Source: Ambulatory Visit | Attending: Family Medicine | Admitting: Family Medicine

## 2015-07-07 DIAGNOSIS — Z1231 Encounter for screening mammogram for malignant neoplasm of breast: Secondary | ICD-10-CM | POA: Insufficient documentation

## 2015-11-03 DIAGNOSIS — H524 Presbyopia: Secondary | ICD-10-CM | POA: Diagnosis not present

## 2015-11-03 DIAGNOSIS — H5203 Hypermetropia, bilateral: Secondary | ICD-10-CM | POA: Diagnosis not present

## 2015-11-03 DIAGNOSIS — H52223 Regular astigmatism, bilateral: Secondary | ICD-10-CM | POA: Diagnosis not present

## 2015-12-24 ENCOUNTER — Ambulatory Visit: Payer: 59 | Admitting: Family Medicine

## 2015-12-24 ENCOUNTER — Telehealth: Payer: Self-pay | Admitting: Family Medicine

## 2015-12-24 NOTE — Telephone Encounter (Signed)
No charge. 

## 2015-12-24 NOTE — Telephone Encounter (Signed)
Patient LVM at 9:43am cancelling her 9am appointment, [facility]. Charge or No Charge

## 2016-01-06 ENCOUNTER — Other Ambulatory Visit: Payer: Self-pay | Admitting: Family Medicine

## 2016-01-06 MED FILL — ALL DAY ALLERGY 10 MG TAB: 10 | 100 days supply | Qty: 100 | Fill #2

## 2016-01-06 MED FILL — TRIAMTERENE/HCTZ 37.5/25 TB: 37.5-25 | 30 days supply | Qty: 30 | Fill #0

## 2016-01-06 MED FILL — ESCITALOPRAM 20 MG TABLET: 20 | 30 days supply | Qty: 30 | Fill #5

## 2016-01-06 MED FILL — MONTELUKAST SOD 10 MG TAB: 10 | 30 days supply | Qty: 30 | Fill #2

## 2016-01-21 ENCOUNTER — Encounter: Payer: Self-pay | Admitting: Family Medicine

## 2016-01-21 ENCOUNTER — Ambulatory Visit (INDEPENDENT_AMBULATORY_CARE_PROVIDER_SITE_OTHER): Payer: 59 | Admitting: Family Medicine

## 2016-01-21 VITALS — BP 130/80 | HR 80 | Temp 98.3°F | Ht 66.0 in | Wt 316.0 lb

## 2016-01-21 DIAGNOSIS — G540 Brachial plexus disorders: Secondary | ICD-10-CM

## 2016-01-21 DIAGNOSIS — J209 Acute bronchitis, unspecified: Secondary | ICD-10-CM

## 2016-01-21 DIAGNOSIS — E876 Hypokalemia: Secondary | ICD-10-CM | POA: Diagnosis not present

## 2016-01-21 DIAGNOSIS — Z0000 Encounter for general adult medical examination without abnormal findings: Secondary | ICD-10-CM

## 2016-01-21 DIAGNOSIS — F32A Depression, unspecified: Secondary | ICD-10-CM

## 2016-01-21 DIAGNOSIS — D649 Anemia, unspecified: Secondary | ICD-10-CM | POA: Diagnosis not present

## 2016-01-21 DIAGNOSIS — R000 Tachycardia, unspecified: Secondary | ICD-10-CM | POA: Diagnosis not present

## 2016-01-21 DIAGNOSIS — I10 Essential (primary) hypertension: Secondary | ICD-10-CM

## 2016-01-21 DIAGNOSIS — F329 Major depressive disorder, single episode, unspecified: Secondary | ICD-10-CM

## 2016-01-21 HISTORY — DX: Acute bronchitis, unspecified: J20.9

## 2016-01-21 MED ORDER — METHYLPREDNISOLONE 4 MG PO TBPK
ORAL_TABLET | ORAL | Status: DC
Start: 2016-01-21 — End: 2016-07-26

## 2016-01-21 MED ORDER — DOXYCYCLINE HYCLATE 100 MG PO TABS: 100.0000 mg | ORAL_TABLET | Freq: Two times a day (BID) | ORAL | Status: DC

## 2016-01-21 MED ORDER — BENZONATATE 100 MG PO CAPS: 100.0000 mg | ORAL_CAPSULE | Freq: Two times a day (BID) | ORAL | Status: DC | PRN

## 2016-01-21 MED FILL — DOXYCYCLINE 100 MG TABLET: 100 | 10 days supply | Qty: 20 | Fill #0

## 2016-01-21 MED FILL — METHYLPREDNISOLONE 4 MG TAB: 4 | 6 days supply | Qty: 21 | Fill #0

## 2016-01-21 MED FILL — BENZONATATE 100 MG CAPSULE: 100 | 15 days supply | Qty: 30 | Fill #0

## 2016-01-21 NOTE — Progress Notes (Signed)
Subjective:    Patient ID: Pamela Solis, female    DOB: 01-12-1961, 54 y.o.   MRN: [id number]  Chief Complaint  Patient presents with  . Follow-up    HPI Patient is in today for follow up.  Patient has some concerns with cough, sputum production, wheezing, and some sob. She had been in her usual state of health and feeling well until about 3 days ago when symptoms started. She endorses malaise, myalgias and nausea but denies CP, vomiting or arorexia, no ear pain or sore throat  Past Medical History  Diagnosis Date  . Asthma   . Hypertension   . Depression   . Allergy   . Obesity   . Anemia   . Chronic kidney disease   . Snoring 11/24/2014  . Edema 11/24/2014  . Thoracic outlet syndrome 11/24/2014  . Cervical cancer screening 06/25/2015  . Acute bronchitis 01/21/2016    Past Surgical History  Procedure Laterality Date  . Cesarean section    . Tubal ligation    . Endometrial ablation  2011    Family History  Problem Relation Age of Onset  . Obesity Mother   . Arthritis Mother     rheumatoid  . Stroke Father     Social History   Social History  . Marital Status: Married    Spouse Name: N/A  . Number of Children: N/A  . Years of Education: N/A   Occupational History  . Not on file.   Social History Main Topics  . Smoking status: Never Smoker   . Smokeless tobacco: Not on file  . Alcohol Use: No  . Drug Use: No  . Sexual Activity: Not on file     Comment: lives with husband and daughters, works for [employer] no dietary restrictions   Other Topics Concern  . Not on file   Social History Narrative    Outpatient Prescriptions Prior to Visit  Medication Sig Dispense Refill  . albuterol (PROVENTIL HFA;VENTOLIN HFA) 108 (90 BASE) MCG/ACT inhaler Inhale 2 puffs into the lungs every 6 (six) hours as needed. For wheezing 1 Inhaler 0  . budesonide-formoterol (SYMBICORT) 80-4.5 MCG/ACT inhaler Inhale 2 puffs into the lungs 2 (two) times daily. Begin using this inhaler  once you have completed the oral prednisone dosing schedule. 1 Inhaler 12  . cetirizine (ZYRTEC) 10 MG tablet Take 1 tablet (10 mg total) by mouth daily. 30 tablet 6  . escitalopram (LEXAPRO) 20 MG tablet Take 1 tablet (20 mg total) by mouth daily. 30 tablet 5  . ibuprofen (ADVIL,MOTRIN) 800 MG tablet Take 1 tablet (800 mg total) by mouth every 8 (eight) hours as needed. For migraine 270 tablet 0  . montelukast (SINGULAIR) 10 MG tablet Take 1 tablet (10 mg total) by mouth daily. 30 tablet 6  . triamterene-hydrochlorothiazide (MAXZIDE-25) 37.5-25 MG tablet Take 1 tablet by mouth every morning. 30 tablet 1  . fluticasone (FLONASE) 50 MCG/ACT nasal spray Place 2 sprays into both nostrils daily as needed for allergies or rhinitis. (Patient not taking: Reported on 06/25/2015) 16 g 6   No facility-administered medications prior to visit.    Allergies  Allergen Reactions  . Penicillins Rash    Review of Systems  Constitutional: Negative for fever and malaise/fatigue.  HENT: Negative for congestion.   Eyes: Negative for blurred vision.  Respiratory: Positive for cough, sputum production and shortness of breath.   Cardiovascular: Negative for chest pain, palpitations and leg swelling.  Gastrointestinal: Negative for nausea, abdominal  pain and blood in stool.  Genitourinary: Negative for dysuria and frequency.  Musculoskeletal: Negative for falls.  Skin: Negative for rash.  Neurological: Negative for dizziness, loss of consciousness and headaches.  Endo/Heme/Allergies: Negative for environmental allergies.  Psychiatric/Behavioral: Negative for depression. The patient is not nervous/anxious.        Objective:    Physical Exam  Constitutional: She is oriented to person, place, and time. She appears well-developed and well-nourished. No distress.  HENT:  Head: Normocephalic and atraumatic.  Eyes: Conjunctivae are normal.  Neck: Neck supple. No thyromegaly present.  Cardiovascular: Normal  rate, regular rhythm and normal heart sounds.   No murmur heard. Pulmonary/Chest: Effort normal and breath sounds normal. No respiratory distress.  Abdominal: Soft. Bowel sounds are normal. She exhibits no distension and no mass. There is no tenderness.  Musculoskeletal: She exhibits no edema.  Lymphadenopathy:    She has no cervical adenopathy.  Neurological: She is alert and oriented to person, place, and time.  Skin: Skin is warm and dry.  Psychiatric: She has a normal mood and affect. Her behavior is normal.    BP 130/80 mmHg  Pulse 80  Temp(Src) 98.3 F (36.8 C) (Oral)  Ht 5\' 6"  (1.676 m)  Wt 316 lb (143.337 kg)  BMI 51.03 kg/m2  SpO2 80% Wt Readings from Last 3 Encounters:  01/21/16 316 lb (143.337 kg)  06/25/15 321 lb 4 oz (145.718 kg)  11/14/14 306 lb 8 oz (139.027 kg)     Lab Results  Component Value Date   WBC 9.2 06/25/2015   HGB 14.1 06/25/2015   HCT 42.3 06/25/2015   PLT 367.0 06/25/2015   GLUCOSE 94 06/25/2015   CHOL 169 06/25/2015   TRIG 77.0 06/25/2015   HDL 64.80 06/25/2015   LDLCALC 89 06/25/2015   ALT 24 06/25/2015   AST 23 06/25/2015   NA 138 06/25/2015   K 3.8 06/25/2015   CL 101 06/25/2015   CREATININE 0.77 06/25/2015   BUN 12 06/25/2015   CO2 30 06/25/2015   TSH 2.33 06/25/2015   HGBA1C 5.7* 01/26/2012    Lab Results  Component Value Date   TSH 2.33 06/25/2015   Lab Results  Component Value Date   WBC 9.2 06/25/2015   HGB 14.1 06/25/2015   HCT 42.3 06/25/2015   MCV 87.4 06/25/2015   PLT 367.0 06/25/2015   Lab Results  Component Value Date   NA 138 06/25/2015   K 3.8 06/25/2015   CO2 30 06/25/2015   GLUCOSE 94 06/25/2015   BUN 12 06/25/2015   CREATININE 0.77 06/25/2015   BILITOT 0.5 06/25/2015   ALKPHOS 103 06/25/2015   AST 23 06/25/2015   ALT 24 06/25/2015   PROT 7.4 06/25/2015   ALBUMIN 3.6 06/25/2015   CALCIUM 9.0 06/25/2015   GFR 83.66 06/25/2015   Lab Results  Component Value Date   CHOL 169 06/25/2015   Lab  Results  Component Value Date   HDL 64.80 06/25/2015   Lab Results  Component Value Date   LDLCALC 89 06/25/2015   Lab Results  Component Value Date   TRIG 77.0 06/25/2015   Lab Results  Component Value Date   CHOLHDL 3 06/25/2015   Lab Results  Component Value Date   HGBA1C 5.7* 01/26/2012       Assessment & Plan:   Problem List Items Addressed This Visit    Acute bronchitis    Patient ill for several days and worsening, start on Medrol dosepak, Tessalon Perles and  given Doxycycline if symptoms worsen or do not resolve. Also mucinex, probiotics, zinc, elderberry and vitamin C      Anemia   Relevant Orders   CBC   Lipid panel   TSH   Comprehensive metabolic panel   Depression    lexapro helping no change in meds      HTN (hypertension)    Well controlled, no changes to meds. Encouraged heart healthy diet such as the DASH diet and exercise as tolerated.       Hypokalemia   Relevant Orders   CBC   Lipid panel   TSH   Comprehensive metabolic panel   Tachycardia - Primary    RRR today on recheck      Relevant Orders   CBC   Lipid panel   TSH   Comprehensive metabolic panel   Thoracic outlet syndrome    Doing well at the present time, may use Lidocaine patches prn       Other Visit Diagnoses    Preventative health care        Relevant Orders    CBC    Lipid panel    TSH    Comprehensive metabolic panel       I have discontinued Ms. Caracci's fluticasone. I am also having her start on methylPREDNISolone, benzonatate, and doxycycline. Additionally, I am having her maintain her budesonide-formoterol, albuterol, escitalopram, cetirizine, montelukast, ibuprofen, and triamterene-hydrochlorothiazide.  Meds ordered this encounter  Medications  . methylPREDNISolone (MEDROL DOSEPAK) 4 MG TBPK tablet    Sig: Take 6 tabs po once, then 5 tabs po x1 days, then 4 tabs po x1 day, then 3 tabs x1 day, then 2 tabs x1 day, and one tab by one day.    Dispense:  21  tablet    Refill:  1  . benzonatate (TESSALON) 100 MG capsule    Sig: Take 1 capsule (100 mg total) by mouth 2 (two) times daily as needed for cough.    Dispense:  30 capsule    Refill:  0  . doxycycline (VIBRA-TABS) 100 MG tablet    Sig: Take 1 tablet (100 mg total) by mouth 2 (two) times daily. x10 days    Dispense:  20 tablet    Refill:  0     [name], MD

## 2016-01-21 NOTE — Progress Notes (Signed)
Pre visit review using our clinic review tool, if applicable. No additional management support is needed unless otherwise documented below in the visit note. 

## 2016-01-21 NOTE — Assessment & Plan Note (Signed)
RRR today on recheck

## 2016-01-21 NOTE — Assessment & Plan Note (Signed)
Patient ill for several days and worsening, start on Medrol dosepak, Tessalon Perles and given Doxycycline if symptoms worsen or do not resolve. Also mucinex, probiotics, zinc, elderberry and vitamin C

## 2016-01-21 NOTE — Assessment & Plan Note (Signed)
lexapro helping no change in meds

## 2016-01-21 NOTE — Assessment & Plan Note (Signed)
Well controlled, no changes to meds. Encouraged heart healthy diet such as the DASH diet and exercise as tolerated.  °

## 2016-01-21 NOTE — Patient Instructions (Addendum)
Zinc, elderberry and probiotic.  Salonpas with lidocaine and aspercreme.  Sinusitis, Adult Sinusitis is redness, soreness, and inflammation of the paranasal sinuses. Paranasal sinuses are air pockets within the bones of your face. They are located beneath your eyes, in the middle of your forehead, and above your eyes. In healthy paranasal sinuses, mucus is able to drain out, and air is able to circulate through them by way of your nose. However, when your paranasal sinuses are inflamed, mucus and air can become trapped. This can allow bacteria and other germs to grow and cause infection. Sinusitis can develop quickly and last only a short time (acute) or continue over a long period (chronic). Sinusitis that lasts for more than 12 weeks is considered chronic. CAUSES Causes of sinusitis include:  Allergies.  Structural abnormalities, such as displacement of the cartilage that separates your nostrils (deviated septum), which can decrease the air flow through your nose and sinuses and affect sinus drainage.  Functional abnormalities, such as when the small hairs (cilia) that line your sinuses and help remove mucus do not work properly or are not present. SIGNS AND SYMPTOMS Symptoms of acute and chronic sinusitis are the same. The primary symptoms are pain and pressure around the affected sinuses. Other symptoms include:  Upper toothache.  Earache.  Headache.  Bad breath.  Decreased sense of smell and taste.  A cough, which worsens when you are lying flat.  Fatigue.  Fever.  Thick drainage from your nose, which often is green and may contain pus (purulent).  Swelling and warmth over the affected sinuses. DIAGNOSIS Your health care provider will perform a physical exam. During your exam, your health care provider may perform any of the following to help determine if you have acute sinusitis or chronic sinusitis:  Look in your nose for signs of abnormal growths in your nostrils (nasal  polyps).  Tap over the affected sinus to check for signs of infection.  View the inside of your sinuses using an imaging device that has a light attached (endoscope). If your health care provider suspects that you have chronic sinusitis, one or more of the following tests may be recommended:  Allergy tests.  Nasal culture. A sample of mucus is taken from your nose, sent to a lab, and screened for bacteria.  Nasal cytology. A sample of mucus is taken from your nose and examined by your health care provider to determine if your sinusitis is related to an allergy. TREATMENT Most cases of acute sinusitis are related to a viral infection and will resolve on their own within 10 days. Sometimes, medicines are prescribed to help relieve symptoms of both acute and chronic sinusitis. These may include pain medicines, decongestants, nasal steroid sprays, or saline sprays. However, for sinusitis related to a bacterial infection, your health care provider will prescribe antibiotic medicines. These are medicines that will help kill the bacteria causing the infection. Rarely, sinusitis is caused by a fungal infection. In these cases, your health care provider will prescribe antifungal medicine. For some cases of chronic sinusitis, surgery is needed. Generally, these are cases in which sinusitis recurs more than 3 times per year, despite other treatments. HOME CARE INSTRUCTIONS  Drink plenty of water. Water helps thin the mucus so your sinuses can drain more easily.  Use a humidifier.  Inhale steam 3-4 times a day (for example, sit in the bathroom with the shower running).  Apply a warm, moist washcloth to your face 3-4 times a day, or as directed by  your health care provider.  Use saline nasal sprays to help moisten and clean your sinuses.  Take medicines only as directed by your health care provider.  If you were prescribed either an antibiotic or antifungal medicine, finish it all even if you start  to feel better. SEEK IMMEDIATE MEDICAL CARE IF:  You have increasing pain or severe headaches.  You have nausea, vomiting, or drowsiness.  You have swelling around your face.  You have vision problems.  You have a stiff neck.  You have difficulty breathing.   This information is not intended to replace advice given to you by your health care provider. Make sure you discuss any questions you have with your health care provider.   Document Released: 09/12/2005 Document Revised: 10/03/2014 Document Reviewed: 09/27/2011 [employer] Interactive Patient Education [employer]

## 2016-01-21 NOTE — Assessment & Plan Note (Signed)
Doing well at the present time, may use Lidocaine patches prn

## 2016-02-29 ENCOUNTER — Other Ambulatory Visit: Payer: Self-pay | Admitting: Family Medicine

## 2016-02-29 MED FILL — ESCITALOPRAM 20 MG TABLET: 20 | 30 days supply | Qty: 30 | Fill #0

## 2016-04-19 DIAGNOSIS — H40011 Open angle with borderline findings, low risk, right eye: Secondary | ICD-10-CM | POA: Diagnosis not present

## 2016-04-19 DIAGNOSIS — H40051 Ocular hypertension, right eye: Secondary | ICD-10-CM | POA: Diagnosis not present

## 2016-05-10 ENCOUNTER — Other Ambulatory Visit: Payer: Self-pay | Admitting: Family Medicine

## 2016-05-10 MED ORDER — BUDESONIDE-FORMOTEROL FUMARATE 80-4.5 MCG/ACT IN AERO: 2.0000 | INHALATION_SPRAY | Freq: Two times a day (BID) | RESPIRATORY_TRACT | 0 refills | Status: DC

## 2016-05-10 MED FILL — ESCITALOPRAM 20 MG TABLET: 20 | 30 days supply | Qty: 30 | Fill #1

## 2016-05-10 MED FILL — SYMBICORT 80-4.5 MCG INH: 80-4.5 | 30 days supply | Qty: 10 | Fill #0

## 2016-05-10 MED FILL — TRIAMTERENE-HCTZ 37.5-25 MG: 37.5-25 | 30 days supply | Qty: 30 | Fill #1

## 2016-05-10 MED FILL — ALL DAY ALLERGY 10 MG TAB: 10 | 100 days supply | Qty: 100 | Fill #0

## 2016-05-10 MED FILL — MONTELUKAST SOD 10 MG TAB: 10 | 30 days supply | Qty: 30 | Fill #0

## 2016-05-13 ENCOUNTER — Other Ambulatory Visit: Payer: Self-pay | Admitting: Family Medicine

## 2016-05-13 MED ORDER — ALBUTEROL SULFATE HFA 108 (90 BASE) MCG/ACT IN AERS: 2.0000 | INHALATION_SPRAY | Freq: Four times a day (QID) | RESPIRATORY_TRACT | 1 refills | Status: DC | PRN

## 2016-05-13 MED FILL — PROAIR HFA 90 MCG INHALER: 108 (90 BAS | 30 days supply | Qty: 9 | Fill #0

## 2016-05-13 NOTE — Addendum Note (Signed)
Addended by: Scharlene GlossEWING, Gwyn Mehring B on: 05/13/2016 11:37 AM   Modules accepted: Orders

## 2016-07-22 ENCOUNTER — Other Ambulatory Visit (INDEPENDENT_AMBULATORY_CARE_PROVIDER_SITE_OTHER): Payer: 59

## 2016-07-22 DIAGNOSIS — Z0000 Encounter for general adult medical examination without abnormal findings: Secondary | ICD-10-CM | POA: Diagnosis not present

## 2016-07-22 LAB — CBC
HCT: 42.6 % (ref 35.0–45.0)
HEMOGLOBIN: 14.2 g/dL (ref 11.7–15.5)
MCH: 29.1 pg (ref 27.0–33.0)
MCHC: 33.3 g/dL (ref 32.0–36.0)
MCV: 87.3 fL (ref 80.0–100.0)
MPV: 10 fL (ref 7.5–12.5)
Platelets: 344 10*3/uL (ref 140–400)
RBC: 4.88 MIL/uL (ref 3.80–5.10)
RDW: 13.2 % (ref 11.0–15.0)
WBC: 9.4 10*3/uL (ref 3.8–10.8)

## 2016-07-22 LAB — TSH: TSH: 1.47 m[IU]/L

## 2016-07-23 LAB — COMPREHENSIVE METABOLIC PANEL
ALBUMIN: 3.8 g/dL (ref 3.6–5.1)
ALT: 19 U/L (ref 6–29)
AST: 19 U/L (ref 10–35)
Alkaline Phosphatase: 92 U/L (ref 33–130)
BILIRUBIN TOTAL: 0.6 mg/dL (ref 0.2–1.2)
BUN: 11 mg/dL (ref 7–25)
CHLORIDE: 102 mmol/L (ref 98–110)
CO2: 24 mmol/L (ref 20–31)
CREATININE: 0.67 mg/dL (ref 0.50–1.05)
Calcium: 8.8 mg/dL (ref 8.6–10.4)
GLUCOSE: 85 mg/dL (ref 65–99)
Potassium: 4.2 mmol/L (ref 3.5–5.3)
SODIUM: 138 mmol/L (ref 135–146)
Total Protein: 6.9 g/dL (ref 6.1–8.1)

## 2016-07-23 LAB — LIPID PANEL
CHOL/HDL RATIO: 2.9 ratio (ref <=5.0)
CHOLESTEROL: 167 mg/dL (ref 125–200)
HDL: 57 mg/dL (ref >=46)
LDL CALC: 89 mg/dL (ref <130)
Triglycerides: 105 mg/dL (ref <150)
VLDL: 21 mg/dL (ref <30)

## 2016-07-26 ENCOUNTER — Encounter: Payer: Self-pay | Admitting: Family Medicine

## 2016-07-26 ENCOUNTER — Ambulatory Visit (INDEPENDENT_AMBULATORY_CARE_PROVIDER_SITE_OTHER): Payer: 59 | Admitting: Family Medicine

## 2016-07-26 VITALS — BP 132/82 | HR 85 | Temp 98.4°F | Wt 332.8 lb

## 2016-07-26 DIAGNOSIS — R519 Headache, unspecified: Secondary | ICD-10-CM

## 2016-07-26 DIAGNOSIS — D649 Anemia, unspecified: Secondary | ICD-10-CM | POA: Diagnosis not present

## 2016-07-26 DIAGNOSIS — F329 Major depressive disorder, single episode, unspecified: Secondary | ICD-10-CM

## 2016-07-26 DIAGNOSIS — I10 Essential (primary) hypertension: Secondary | ICD-10-CM

## 2016-07-26 DIAGNOSIS — R51 Headache: Secondary | ICD-10-CM | POA: Diagnosis not present

## 2016-07-26 DIAGNOSIS — Z23 Encounter for immunization: Secondary | ICD-10-CM | POA: Diagnosis not present

## 2016-07-26 DIAGNOSIS — Z0000 Encounter for general adult medical examination without abnormal findings: Secondary | ICD-10-CM | POA: Diagnosis not present

## 2016-07-26 DIAGNOSIS — F32A Depression, unspecified: Secondary | ICD-10-CM

## 2016-07-26 DIAGNOSIS — R0683 Snoring: Secondary | ICD-10-CM | POA: Diagnosis not present

## 2016-07-26 DIAGNOSIS — E669 Obesity, unspecified: Secondary | ICD-10-CM

## 2016-07-26 MED ORDER — ESCITALOPRAM OXALATE 10 MG PO TABS: 10.0000 mg | ORAL_TABLET | Freq: Every day | ORAL | 5 refills | Status: DC

## 2016-07-26 MED FILL — MONTELUKAST SOD 10 MG TAB: 10 | 30 days supply | Qty: 30 | Fill #1

## 2016-07-26 MED FILL — ESCITALOPRAM 10 MG TABLET: 10 | 30 days supply | Qty: 30 | Fill #0

## 2016-07-26 NOTE — Assessment & Plan Note (Signed)
Well controlled, no changes to meds. Encouraged heart healthy diet such as the DASH diet and exercise as tolerated.  °

## 2016-07-26 NOTE — Assessment & Plan Note (Signed)
lexapro helped some but not fully. Will add 10 mg in pm to the 20 mg q am.

## 2016-07-26 NOTE — Patient Instructions (Signed)
Preventive Care for Adults, Female A healthy lifestyle and preventive care can promote health and wellness. Preventive health guidelines for women include the following key practices.  A routine yearly physical is a good way to check with your health care provider about your health and preventive screening. It is a chance to share any concerns and updates on your health and to receive a thorough exam.  Visit your dentist for a routine exam and preventive care every 6 months. Brush your teeth twice a day and floss once a day. Good oral hygiene prevents tooth decay and gum disease.  The frequency of eye exams is based on your age, health, family medical history, use of contact lenses, and other factors. Follow your health care provider's recommendations for frequency of eye exams.  Eat a healthy diet. Foods like vegetables, fruits, whole grains, low-fat dairy products, and lean protein foods contain the nutrients you need without too many calories. Decrease your intake of foods high in solid fats, added sugars, and salt. Eat the right amount of calories for you.Get information about a proper diet from your health care provider, if necessary.  Regular physical exercise is one of the most important things you can do for your health. Most adults should get at least 150 minutes of moderate-intensity exercise (any activity that increases your heart rate and causes you to sweat) each week. In addition, most adults need muscle-strengthening exercises on 2 or more days a week.  Maintain a healthy weight. The body mass index (BMI) is a screening tool to identify possible weight problems. It provides an estimate of body fat based on height and weight. Your health care provider can find your BMI and can help you achieve or maintain a healthy weight.For adults 20 years and older:  A BMI below 18.5 is considered underweight.  A BMI of 18.5 to 24.9 is normal.  A BMI of 25 to 29.9 is considered overweight.  A  BMI of 30 and above is considered obese.  Maintain normal blood lipids and cholesterol levels by exercising and minimizing your intake of saturated fat. Eat a balanced diet with plenty of fruit and vegetables. Blood tests for lipids and cholesterol should begin at age 20 and be repeated every 5 years. If your lipid or cholesterol levels are high, you are over 50, or you are at high risk for heart disease, you may need your cholesterol levels checked more frequently.Ongoing high lipid and cholesterol levels should be treated with medicines if diet and exercise are not working.  If you smoke, find out from your health care provider how to quit. If you do not use tobacco, do not start.  Lung cancer screening is recommended for adults aged 55-80 years who are at high risk for developing lung cancer because of a history of smoking. A yearly low-dose CT scan of the lungs is recommended for people who have at least a 30-pack-year history of smoking and are a current smoker or have quit within the past 15 years. A pack year of smoking is smoking an average of 1 pack of cigarettes a day for 1 year (for example: 1 pack a day for 30 years or 2 packs a day for 15 years). Yearly screening should continue until the smoker has stopped smoking for at least 15 years. Yearly screening should be stopped for people who develop a health problem that would prevent them from having lung cancer treatment.  If you are pregnant, do not drink alcohol. If you are   are breastfeeding, be very cautious about drinking alcohol. If you are not pregnant and choose to drink alcohol, do not have more than 1 drink per day. One drink is considered to be 12 ounces (355 mL) of beer, 5 ounces (148 mL) of wine, or 1.5 ounces (44 mL) of liquor.  Avoid use of street drugs. Do not share needles with anyone. Ask for help if you need support or instructions about stopping the use of drugs.  High blood pressure causes heart disease and  increases the risk of stroke. Your blood pressure should be checked at least every 1 to 2 years. Ongoing high blood pressure should be treated with medicines if weight loss and exercise do not work.  If you are 12-58 years old, ask your health care provider if you should take aspirin to prevent strokes.  Diabetes screening is done by taking a blood sample to check your blood glucose level after you have not eaten for a certain period of time (fasting). If you are not overweight and you do not have risk factors for diabetes, you should be screened once every 3 years starting at age 76. If you are overweight or obese and you are 18-1 years of age, you should be screened for diabetes every year as part of your cardiovascular risk assessment.  Breast cancer screening is essential preventive care for women. You should practice "breast self-awareness." This means understanding the normal appearance and feel of your breasts and may include breast self-examination. Any changes detected, no matter how small, should be reported to a health care provider. Women in their 73s and 30s should have a clinical breast exam (CBE) by a health care provider as part of a regular health exam every 1 to 3 years. After age 36, women should have a CBE every year. Starting at age 50, women should consider having a mammogram (breast X-ray test) every year. Women who have a family history of breast cancer should talk to their health care provider about genetic screening. Women at a high risk of breast cancer should talk to their health care providers about having an MRI and a mammogram every year.  Breast cancer gene (BRCA)-related cancer risk assessment is recommended for women who have family members with BRCA-related cancers. BRCA-related cancers include breast, ovarian, tubal, and peritoneal cancers. Having family members with these cancers may be associated with an increased risk for harmful changes (mutations) in the breast  cancer genes BRCA1 and BRCA2. Results of the assessment will determine the need for genetic counseling and BRCA1 and BRCA2 testing.  Your health care provider may recommend that you be screened regularly for cancer of the pelvic organs (ovaries, uterus, and vagina). This screening involves a pelvic examination, including checking for microscopic changes to the surface of your cervix (Pap test). You may be encouraged to have this screening done every 3 years, beginning at age 40.  For women ages 32-65, health care providers may recommend pelvic exams and Pap testing every 3 years, or they may recommend the Pap and pelvic exam, combined with testing for human papilloma virus (HPV), every 5 years. Some types of HPV increase your risk of cervical cancer. Testing for HPV may also be done on women of any age with unclear Pap test results.  Other health care providers may not recommend any screening for nonpregnant women who are considered low risk for pelvic cancer and who do not have symptoms. Ask your health care provider if a screening pelvic exam is right  you.  If you have had past treatment for cervical cancer or a condition that could lead to cancer, you need Pap tests and screening for cancer for at least 20 years after your treatment. If Pap tests have been discontinued, your risk factors (such as having a new sexual partner) need to be reassessed to determine if screening should resume. Some women have medical problems that increase the chance of getting cervical cancer. In these cases, your health care provider may recommend more frequent screening and Pap tests.  Colorectal cancer can be detected and often prevented. Most routine colorectal cancer screening begins at the age of 50 years and continues through age 75 years. However, your health care provider may recommend screening at an earlier age if you have risk factors for colon cancer. On a yearly basis, your health care provider may provide home test kits to check  for hidden blood in the stool. Use of a small camera at the end of a tube, to directly examine the colon (sigmoidoscopy or colonoscopy), can detect the earliest forms of colorectal cancer. Talk to your health care provider about this at age 50, when routine screening begins. Direct exam of the colon should be repeated every 5-10 years through age 75 years, unless early forms of precancerous polyps or small growths are found.  People who are at an increased risk for hepatitis B should be screened for this virus. You are considered at high risk for hepatitis B if:  You were born in a country where hepatitis B occurs often. Talk with your health care provider about which countries are considered high risk.  Your parents were born in a high-risk country and you have not received a shot to protect against hepatitis B (hepatitis B vaccine).  You have HIV or AIDS.  You use needles to inject street drugs.  You live with, or have sex with, someone who has hepatitis B.  You get hemodialysis treatment.  You take certain medicines for conditions like cancer, organ transplantation, and autoimmune conditions.  Hepatitis C blood testing is recommended for all people born from 1945 through 1965 and any individual with known risks for hepatitis C.  Practice safe sex. Use condoms and avoid high-risk sexual practices to reduce the spread of sexually transmitted infections (STIs). STIs include gonorrhea, chlamydia, syphilis, trichomonas, herpes, HPV, and human immunodeficiency virus (HIV). Herpes, HIV, and HPV are viral illnesses that have no cure. They can result in disability, cancer, and death.  You should be screened for sexually transmitted illnesses (STIs) including gonorrhea and chlamydia if:  You are sexually active and are younger than 24 years.  You are older than 24 years and your health care provider tells you that you are at risk for this type of infection.  Your sexual activity has changed  since you were last screened and you are at an increased risk for chlamydia or gonorrhea. Ask your health care provider if you are at risk.  If you are at risk of being infected with HIV, it is recommended that you take a prescription medicine daily to prevent HIV infection. This is called preexposure prophylaxis (PrEP). You are considered at risk if:  You are sexually active and do not regularly use condoms or know the HIV status of your partner(s).  You take drugs by injection.  You are sexually active with a partner who has HIV.  Talk with your health care provider about whether you are at high risk of being infected with HIV. If   you choose to begin PrEP, you should first be tested for HIV. You should then be tested every 3 months for as long as you are taking PrEP.  Osteoporosis is a disease in which the bones lose minerals and strength with aging. This can result in serious bone fractures or breaks. The risk of osteoporosis can be identified using a bone density scan. Women ages 65 years and over and women at risk for fractures or osteoporosis should discuss screening with their health care providers. Ask your health care provider whether you should take a calcium supplement or vitamin D to reduce the rate of osteoporosis.  Menopause can be associated with physical symptoms and risks. Hormone replacement therapy is available to decrease symptoms and risks. You should talk to your health care provider about whether hormone replacement therapy is right for you.  Use sunscreen. Apply sunscreen liberally and repeatedly throughout the day. You should seek shade when your shadow is shorter than you. Protect yourself by wearing long sleeves, pants, a wide-brimmed hat, and sunglasses year round, whenever you are outdoors.  Once a month, do a whole body skin exam, using a mirror to look at the skin on your back. Tell your health care provider of new moles, moles that have irregular borders, moles that  are larger than a pencil eraser, or moles that have changed in shape or color.  Stay current with required vaccines (immunizations).  Influenza vaccine. All adults should be immunized every year.  Tetanus, diphtheria, and acellular pertussis (Td, Tdap) vaccine. Pregnant women should receive 1 dose of Tdap vaccine during each pregnancy. The dose should be obtained regardless of the length of time since the last dose. Immunization is preferred during the 27th-36th week of gestation. An adult who has not previously received Tdap or who does not know her vaccine status should receive 1 dose of Tdap. This initial dose should be followed by tetanus and diphtheria toxoids (Td) booster doses every 10 years. Adults with an unknown or incomplete history of completing a 3-dose immunization series with Td-containing vaccines should begin or complete a primary immunization series including a Tdap dose. Adults should receive a Td booster every 10 years.  Varicella vaccine. An adult without evidence of immunity to varicella should receive 2 doses or a second dose if she has previously received 1 dose. Pregnant females who do not have evidence of immunity should receive the first dose after pregnancy. This first dose should be obtained before leaving the health care facility. The second dose should be obtained 4-8 weeks after the first dose.  Human papillomavirus (HPV) vaccine. Females aged 13-26 years who have not received the vaccine previously should obtain the 3-dose series. The vaccine is not recommended for use in pregnant females. However, pregnancy testing is not needed before receiving a dose. If a female is found to be pregnant after receiving a dose, no treatment is needed. In that case, the remaining doses should be delayed until after the pregnancy. Immunization is recommended for any person with an immunocompromised condition through the age of 26 years if she did not get any or all doses earlier. During the  3-dose series, the second dose should be obtained 4-8 weeks after the first dose. The third dose should be obtained 24 weeks after the first dose and 16 weeks after the second dose.  Zoster vaccine. One dose is recommended for adults aged 60 years or older unless certain conditions are present.  Measles, mumps, and rubella (MMR) vaccine. Adults born   born before [birth year] generally are considered immune to measles and mumps. Adults born in [birth year] or later should have 1 or more doses of MMR vaccine unless there is a contraindication to the vaccine or there is laboratory evidence of immunity to each of the three diseases. A routine second dose of MMR vaccine should be obtained at least 28 days after the first dose for students attending postsecondary schools, health care workers, or international travelers. People who received inactivated measles vaccine or an unknown type of measles vaccine during 1963-1967 should receive 2 doses of MMR vaccine. People who received inactivated mumps vaccine or an unknown type of mumps vaccine before 1979 and are at high risk for mumps infection should consider immunization with 2 doses of MMR vaccine. For females of childbearing age, rubella immunity should be determined. If there is no evidence of immunity, females who are not pregnant should be vaccinated. If there is no evidence of immunity, females who are pregnant should delay immunization until after pregnancy. Unvaccinated health care workers born before [birth year] who lack laboratory evidence of measles, mumps, or rubella immunity or laboratory confirmation of disease should consider measles and mumps immunization with 2 doses of MMR vaccine or rubella immunization with 1 dose of MMR vaccine.  Pneumococcal 13-valent conjugate (PCV13) vaccine. When indicated, a person who is uncertain of his immunization history and has no record of immunization should receive the PCV13 vaccine. All adults 62 years of age and older  should receive this vaccine. An adult aged 39 years or older who has certain medical conditions and has not been previously immunized should receive 1 dose of PCV13 vaccine. This PCV13 should be followed with a dose of pneumococcal polysaccharide (PPSV23) vaccine. Adults who are at high risk for pneumococcal disease should obtain the PPSV23 vaccine at least 8 weeks after the dose of PCV13 vaccine. Adults older than 53 years of age who have normal immune system function should obtain the PPSV23 vaccine dose at least 1 year after the dose of PCV13 vaccine.  Pneumococcal polysaccharide (PPSV23) vaccine. When PCV13 is also indicated, PCV13 should be obtained first. All adults aged 47 years and older should be immunized. An adult younger than age 27 years who has certain medical conditions should be immunized. Any person who resides in a nursing home or long-term care facility should be immunized. An adult smoker should be immunized. People with an immunocompromised condition and certain other conditions should receive both PCV13 and PPSV23 vaccines. People with human immunodeficiency virus (HIV) infection should be immunized as soon as possible after diagnosis. Immunization during chemotherapy or radiation therapy should be avoided. Routine use of PPSV23 vaccine is not recommended for American Indians, [place] Natives, or people younger than 65 years unless there are medical conditions that require PPSV23 vaccine. When indicated, people who have unknown immunization and have no record of immunization should receive PPSV23 vaccine. One-time revaccination 5 years after the first dose of PPSV23 is recommended for people aged 19-64 years who have chronic kidney failure, nephrotic syndrome, asplenia, or immunocompromised conditions. People who received 1-2 doses of PPSV23 before age 57 years should receive another dose of PPSV23 vaccine at age 42 years or later if at least 5 years have passed since the previous dose. Doses  of PPSV23 are not needed for people immunized with PPSV23 at or after age 51 years.  Meningococcal vaccine. Adults with asplenia or persistent complement component deficiencies should receive 2 doses of quadrivalent meningococcal conjugate (MenACWY-D) vaccine. The doses should be  at least 2 months apart. Microbiologists working with certain meningococcal bacteria, military recruits, people at risk during an outbreak, and people who travel to or live in countries with a high rate of meningitis should be immunized. A first-year college student up through age 21 years who is living in a residence hall should receive a dose if she did not receive a dose on or after her 16th birthday. Adults who have certain high-risk conditions should receive one or more doses of vaccine.  Hepatitis A vaccine. Adults who wish to be protected from this disease, have certain high-risk conditions, work with hepatitis A-infected animals, work in hepatitis A research labs, or travel to or work in countries with a high rate of hepatitis A should be immunized. Adults who were previously unvaccinated and who anticipate close contact with an international adoptee during the first 60 days after arrival in the [place] from a country with a high rate of hepatitis A should be immunized.  Hepatitis B vaccine. Adults who wish to be protected from this disease, have certain high-risk conditions, may be exposed to blood or other infectious body fluids, are household contacts or sex partners of hepatitis B positive people, are clients or workers in certain care facilities, or travel to or work in countries with a high rate of hepatitis B should be immunized.  Haemophilus influenzae type b (Hib) vaccine. A previously unvaccinated person with asplenia or sickle cell disease or having a scheduled splenectomy should receive 1 dose of Hib vaccine. Regardless of previous immunization, a recipient of a hematopoietic stem cell transplant should receive a  3-dose series 6-12 months after her successful transplant. Hib vaccine is not recommended for adults with HIV infection. Preventive Services / Frequency Ages 19 to 39 years  Blood pressure check.** / Every 3-5 years.  Lipid and cholesterol check.** / Every 5 years beginning at age 20.  Clinical breast exam.** / Every 3 years for women in their 20s and 30s.  BRCA-related cancer risk assessment.** / For women who have family members with a BRCA-related cancer (breast, ovarian, tubal, or peritoneal cancers).  Pap test.** / Every 2 years from ages 21 through 29. Every 3 years starting at age 30 through age 65 or 70 with a history of 3 consecutive normal Pap tests.  HPV screening.** / Every 3 years from ages 30 through ages 65 to 70 with a history of 3 consecutive normal Pap tests.  Hepatitis C blood test.** / For any individual with known risks for hepatitis C.  Skin self-exam. / Monthly.  Influenza vaccine. / Every year.  Tetanus, diphtheria, and acellular pertussis (Tdap, Td) vaccine.** / Consult your health care provider. Pregnant women should receive 1 dose of Tdap vaccine during each pregnancy. 1 dose of Td every 10 years.  Varicella vaccine.** / Consult your health care provider. Pregnant females who do not have evidence of immunity should receive the first dose after pregnancy.  HPV vaccine. / 3 doses over 6 months, if 26 and younger. The vaccine is not recommended for use in pregnant females. However, pregnancy testing is not needed before receiving a dose.  Measles, mumps, rubella (MMR) vaccine.** / You need at least 1 dose of MMR if you were born in 1957 or later. You may also need a 2nd dose. For females of childbearing age, rubella immunity should be determined. If there is no evidence of immunity, females who are not pregnant should be vaccinated. If there is no evidence of immunity, females who are   pregnant should delay immunization until after pregnancy.  Pneumococcal  13-valent conjugate (PCV13) vaccine.** / Consult your health care provider.  Pneumococcal polysaccharide (PPSV23) vaccine.** / 1 to 2 doses if you smoke cigarettes or if you have certain conditions.  Meningococcal vaccine.** / 1 dose if you are age 19 to 21 years and a first-year college student living in a residence hall, or have one of several medical conditions, you need to get vaccinated against meningococcal disease. You may also need additional booster doses.  Hepatitis A vaccine.** / Consult your health care provider.  Hepatitis B vaccine.** / Consult your health care provider.  Haemophilus influenzae type b (Hib) vaccine.** / Consult your health care provider. Ages 40 to 64 years  Blood pressure check.** / Every year.  Lipid and cholesterol check.** / Every 5 years beginning at age 20 years.  Lung cancer screening. / Every year if you are aged 55-80 years and have a 30-pack-year history of smoking and currently smoke or have quit within the past 15 years. Yearly screening is stopped once you have quit smoking for at least 15 years or develop a health problem that would prevent you from having lung cancer treatment.  Clinical breast exam.** / Every year after age 40 years.  BRCA-related cancer risk assessment.** / For women who have family members with a BRCA-related cancer (breast, ovarian, tubal, or peritoneal cancers).  Mammogram.** / Every year beginning at age 40 years and continuing for as long as you are in good health. Consult with your health care provider.  Pap test.** / Every 3 years starting at age 30 years through age 65 or 70 years with a history of 3 consecutive normal Pap tests.  HPV screening.** / Every 3 years from ages 30 years through ages 65 to 70 years with a history of 3 consecutive normal Pap tests.  Fecal occult blood test (FOBT) of stool. / Every year beginning at age 50 years and continuing until age 75 years. You may not need to do this test if you get  a colonoscopy every 10 years.  Flexible sigmoidoscopy or colonoscopy.** / Every 5 years for a flexible sigmoidoscopy or every 10 years for a colonoscopy beginning at age 50 years and continuing until age 75 years.  Hepatitis C blood test.** / For all people born from 1945 through 1965 and any individual with known risks for hepatitis C.  Skin self-exam. / Monthly.  Influenza vaccine. / Every year.  Tetanus, diphtheria, and acellular pertussis (Tdap/Td) vaccine.** / Consult your health care provider. Pregnant women should receive 1 dose of Tdap vaccine during each pregnancy. 1 dose of Td every 10 years.  Varicella vaccine.** / Consult your health care provider. Pregnant females who do not have evidence of immunity should receive the first dose after pregnancy.  Zoster vaccine.** / 1 dose for adults aged 60 years or older.  Measles, mumps, rubella (MMR) vaccine.** / You need at least 1 dose of MMR if you were born in 1957 or later. You may also need a second dose. For females of childbearing age, rubella immunity should be determined. If there is no evidence of immunity, females who are not pregnant should be vaccinated. If there is no evidence of immunity, females who are pregnant should delay immunization until after pregnancy.  Pneumococcal 13-valent conjugate (PCV13) vaccine.** / Consult your health care provider.  Pneumococcal polysaccharide (PPSV23) vaccine.** / 1 to 2 doses if you smoke cigarettes or if you have certain conditions.  Meningococcal vaccine.** /   Consult your health care provider.  Hepatitis A vaccine.** / Consult your health care provider.  Hepatitis B vaccine.** / Consult your health care provider.  Haemophilus influenzae type b (Hib) vaccine.** / Consult your health care provider. Ages 80 years and over  Blood pressure check.** / Every year.  Lipid and cholesterol check.** / Every 5 years beginning at age 62 years.  Lung cancer  screening. / Every year if you are aged 32-80 years and have a 30-pack-year history of smoking and currently smoke or have quit within the past 15 years. Yearly screening is stopped once you have quit smoking for at least 15 years or develop a health problem that would prevent you from having lung cancer treatment.  Clinical breast exam.** / Every year after age 61 years.  BRCA-related cancer risk assessment.** / For women who have family members with a BRCA-related cancer (breast, ovarian, tubal, or peritoneal cancers).  Mammogram.** / Every year beginning at age 39 years and continuing for as long as you are in good health. Consult with your health care provider.  Pap test.** / Every 3 years starting at age 85 years through age 74 or 72 years with 3 consecutive normal Pap tests. Testing can be stopped between 65 and 70 years with 3 consecutive normal Pap tests and no abnormal Pap or HPV tests in the past 10 years.  HPV screening.** / Every 3 years from ages 55 years through ages 67 or 77 years with a history of 3 consecutive normal Pap tests. Testing can be stopped between 65 and 70 years with 3 consecutive normal Pap tests and no abnormal Pap or HPV tests in the past 10 years.  Fecal occult blood test (FOBT) of stool. / Every year beginning at age 81 years and continuing until age 22 years. You may not need to do this test if you get a colonoscopy every 10 years.  Flexible sigmoidoscopy or colonoscopy.** / Every 5 years for a flexible sigmoidoscopy or every 10 years for a colonoscopy beginning at age 67 years and continuing until age 22 years.  Hepatitis C blood test.** / For all people born from [birth year] through 1963 and any individual with known risks for hepatitis C.  Osteoporosis screening.** / A one-time screening for women ages 8 years and over and women at risk for fractures or osteoporosis.  Skin self-exam. / Monthly.  Influenza vaccine. / Every year.  Tetanus, diphtheria, and  acellular pertussis (Tdap/Td) vaccine.** / 1 dose of Td every 10 years.  Varicella vaccine.** / Consult your health care provider.  Zoster vaccine.** / 1 dose for adults aged 56 years or older.  Pneumococcal 13-valent conjugate (PCV13) vaccine.** / Consult your health care provider.  Pneumococcal polysaccharide (PPSV23) vaccine.** / 1 dose for all adults aged 15 years and older.  Meningococcal vaccine.** / Consult your health care provider.  Hepatitis A vaccine.** / Consult your health care provider.  Hepatitis B vaccine.** / Consult your health care provider.  Haemophilus influenzae type b (Hib) vaccine.** / Consult your health care provider. ** Family history and personal history of risk and conditions may change your health care provider's recommendations.   This information is not intended to replace advice given to you by your health care provider. Make sure you discuss any questions you have with your health care provider.   Document Released: 11/08/2001 Document Revised: 10/03/2014 Document Reviewed: 02/07/2011 Elsevier Interactive Patient Education [employer].

## 2016-07-26 NOTE — Progress Notes (Signed)
Pre visit review using our clinic review tool, if applicable. No additional management support is needed unless otherwise documented below in the visit note. 

## 2016-07-26 NOTE — Progress Notes (Signed)
Patient ID: ARBIE REISZ, female   DOB: 1960/10/01, 55 y.o.   MRN: [id number]   Subjective:    Patient ID: [name], female    DOB: September 28, 1960, 55 y.o.   MRN: [id number]  No chief complaint on file.   HPI Patient is in today for an annual exam, with no acute concerns. She feels well. No recent illness or acute concerns. Is trying to stay active and eat a heart healthy diet. Denies CP/palp/SOB/HA/congestion/fevers/GI or GU c/o. Taking meds as prescribed. Doing well with ADLs   Past Medical History:  Diagnosis Date  . Acute bronchitis 01/21/2016  . Allergy   . Anemia   . Asthma   . Cervical cancer screening 06/25/2015  . Chronic kidney disease   . Depression   . Edema 11/24/2014  . Hypertension   . Obesity   . Preventative health care 07/31/2016  . Snoring 11/24/2014  . Thoracic outlet syndrome 11/24/2014    Past Surgical History:  Procedure Laterality Date  . CESAREAN SECTION    . ENDOMETRIAL ABLATION  2011  . TUBAL LIGATION      Family History  Problem Relation Age of Onset  . Obesity Mother   . Arthritis Mother     rheumatoid  . Stroke Father     Social History   Social History  . Marital status: Married    Spouse name: N/A  . Number of children: N/A  . Years of education: N/A   Occupational History  . Not on file.   Social History Main Topics  . Smoking status: Never Smoker  . Smokeless tobacco: Not on file  . Alcohol use No  . Drug use: No  . Sexual activity: Not on file     Comment: lives with husband and daughters, works for [employer] no dietary restrictions   Other Topics Concern  . Not on file   Social History Narrative  . No narrative on file    Outpatient Medications Prior to Visit  Medication Sig Dispense Refill  . albuterol (PROVENTIL HFA;VENTOLIN HFA) 108 (90 Base) MCG/ACT inhaler Inhale 2 puffs into the lungs every 6 (six) hours as needed. For wheezing 1 Inhaler 1  . ALL DAY ALLERGY 10 MG tablet TAKE 1 TABLET (10 MG TOTAL) BY MOUTH DAILY.  100 tablet 3  . benzonatate (TESSALON) 100 MG capsule Take 1 capsule (100 mg total) by mouth 2 (two) times daily as needed for cough. 30 capsule 0  . budesonide-formoterol (SYMBICORT) 80-4.5 MCG/ACT inhaler Inhale 2 puffs into the lungs 2 (two) times daily. Begin using this inhaler once you have completed the oral prednisone dosing schedule. 1 Inhaler 0  . escitalopram (LEXAPRO) 20 MG tablet TAKE 1 TABLET (20 MG) BY MOUTH DAILY. 30 tablet 5  . ibuprofen (ADVIL,MOTRIN) 800 MG tablet Take 1 tablet (800 mg total) by mouth every 8 (eight) hours as needed. For migraine 270 tablet 0  . montelukast (SINGULAIR) 10 MG tablet TAKE 1 TABLET (10 MG TOTAL) BY MOUTH DAILY. 30 tablet 6  . triamterene-hydrochlorothiazide (MAXZIDE-25) 37.5-25 MG tablet Take 1 tablet by mouth every morning. 30 tablet 1  . doxycycline (VIBRA-TABS) 100 MG tablet Take 1 tablet (100 mg total) by mouth 2 (two) times daily. x10 days (Patient not taking: Reported on 07/26/2016) 20 tablet 0  . methylPREDNISolone (MEDROL DOSEPAK) 4 MG TBPK tablet Take 6 tabs po once, then 5 tabs po x1 days, then 4 tabs po x1 day, then 3 tabs x1 day, then 2 tabs x1  day, and one tab by one day. (Patient not taking: Reported on 07/26/2016) 21 tablet 1   No facility-administered medications prior to visit.     Allergies  Allergen Reactions  . Penicillins Rash    Review of Systems  Constitutional: Negative for fever.  Eyes: Negative for blurred vision.  Respiratory: Negative for cough and shortness of breath.   Cardiovascular: Negative for chest pain and palpitations.  Gastrointestinal: Negative for vomiting.  Musculoskeletal: Negative for back pain.  Skin: Negative for rash.  Neurological: Negative for loss of consciousness and headaches.       Objective:    Physical Exam  Constitutional: She is oriented to person, place, and time. She appears well-developed and well-nourished. No distress.  HENT:  Head: Normocephalic and atraumatic.  Eyes:  Conjunctivae are normal.  Neck: Normal range of motion. No thyromegaly present.  Cardiovascular: Normal rate and regular rhythm.   Pulmonary/Chest: Effort normal and breath sounds normal. She has no wheezes.  Abdominal: Soft. Bowel sounds are normal. There is no tenderness.  Musculoskeletal: Normal range of motion. She exhibits no edema or deformity.  Neurological: She is alert and oriented to person, place, and time.  Skin: Skin is warm and dry. She is not diaphoretic.  Psychiatric: She has a normal mood and affect.    BP 132/82 (BP Location: Left Arm, Patient Position: Sitting, Cuff Size: Large)   Pulse 85   Temp 98.4 F (36.9 C) (Oral)   Wt (!) 332 lb 12.8 oz (151 kg)   SpO2 95%   BMI 53.72 kg/m  Wt Readings from Last 3 Encounters:  07/26/16 (!) 332 lb 12.8 oz (151 kg)  01/21/16 (!) 316 lb (143.3 kg)  06/25/15 (!) 321 lb 4 oz (145.7 kg)     Lab Results  Component Value Date   WBC 9.4 07/22/2016   HGB 14.2 07/22/2016   HCT 42.6 07/22/2016   PLT 344 07/22/2016   GLUCOSE 85 07/22/2016   CHOL 167 07/22/2016   TRIG 105 07/22/2016   HDL 57 07/22/2016   LDLCALC 89 07/22/2016   ALT 19 07/22/2016   AST 19 07/22/2016   NA 138 07/22/2016   K 4.2 07/22/2016   CL 102 07/22/2016   CREATININE 0.67 07/22/2016   BUN 11 07/22/2016   CO2 24 07/22/2016   TSH 1.47 07/22/2016   HGBA1C 5.7 (H) 01/26/2012    Lab Results  Component Value Date   TSH 1.47 07/22/2016   Lab Results  Component Value Date   WBC 9.4 07/22/2016   HGB 14.2 07/22/2016   HCT 42.6 07/22/2016   MCV 87.3 07/22/2016   PLT 344 07/22/2016   Lab Results  Component Value Date   NA 138 07/22/2016   K 4.2 07/22/2016   CO2 24 07/22/2016   GLUCOSE 85 07/22/2016   BUN 11 07/22/2016   CREATININE 0.67 07/22/2016   BILITOT 0.6 07/22/2016   ALKPHOS 92 07/22/2016   AST 19 07/22/2016   ALT 19 07/22/2016   PROT 6.9 07/22/2016   ALBUMIN 3.8 07/22/2016   CALCIUM 8.8 07/22/2016   GFR 83.66 06/25/2015   Lab  Results  Component Value Date   CHOL 167 07/22/2016   Lab Results  Component Value Date   HDL 57 07/22/2016   Lab Results  Component Value Date   LDLCALC 89 07/22/2016   Lab Results  Component Value Date   TRIG 105 07/22/2016   Lab Results  Component Value Date   CHOLHDL 2.9 07/22/2016   Lab  Results  Component Value Date   HGBA1C 5.7 (H) 01/26/2012       Assessment & Plan:   Problem List Items Addressed This Visit    HTN (hypertension)    Well controlled, no changes to meds. Encouraged heart healthy diet such as the DASH diet and exercise as tolerated.       Relevant Orders   Ambulatory referral to Pulmonology   Depression    lexapro helped some but not fully. Will add 10 mg in pm to the 20 mg q am.      Relevant Medications   escitalopram (LEXAPRO) 10 MG tablet   Other Relevant Orders   Ambulatory referral to Pulmonology   Obesity    Encouraged DASH diet, decrease po intake and increase exercise as tolerated. Needs 7-8 hours of sleep nightly. Avoid trans fats, eat small, frequent meals every 4-5 hours with lean proteins, complex carbs and healthy fats. Minimize simple carbs      Relevant Orders   Ambulatory referral to Pulmonology   Anemia    Increase leafy greens, consider increased lean red meat and using cast iron cookware. Continue to monitor, report any concerns      Snoring   Relevant Orders   Ambulatory referral to Pulmonology   Preventative health care    Other Visit Diagnoses    Frequent headaches    -  Primary   Relevant Medications   escitalopram (LEXAPRO) 10 MG tablet   Other Relevant Orders   Ambulatory referral to Pulmonology      I have discontinued Ms. Higley's methylPREDNISolone and doxycycline. I am also having her start on escitalopram. Additionally, I am having her maintain her ibuprofen, triamterene-hydrochlorothiazide, benzonatate, escitalopram, montelukast, ALL DAY ALLERGY, budesonide-formoterol, and albuterol.  Meds ordered  this encounter  Medications  . escitalopram (LEXAPRO) 10 MG tablet    Sig: Take 1 tablet (10 mg total) by mouth at bedtime.    Dispense:  30 tablet    Refill:  5     Danise EdgeBLYTH, Trevor Duty, MD

## 2016-07-26 NOTE — Assessment & Plan Note (Signed)
Encouraged DASH diet, decrease po intake and increase exercise as tolerated. Needs 7-8 hours of sleep nightly. Avoid trans fats, eat small, frequent meals every 4-5 hours with lean proteins, complex carbs and healthy fats. Minimize simple carbs 

## 2016-07-26 NOTE — Assessment & Plan Note (Signed)
RRR 

## 2016-07-31 ENCOUNTER — Encounter: Payer: Self-pay | Admitting: Family Medicine

## 2016-07-31 DIAGNOSIS — Z0000 Encounter for general adult medical examination without abnormal findings: Secondary | ICD-10-CM

## 2016-07-31 HISTORY — DX: Encounter for general adult medical examination without abnormal findings: Z00.00

## 2016-07-31 NOTE — Assessment & Plan Note (Signed)
Increase leafy greens, consider increased lean red meat and using cast iron cookware. Continue to monitor, report any concerns 

## 2016-10-05 ENCOUNTER — Telehealth: Payer: 59 | Admitting: Nurse Practitioner

## 2016-10-05 DIAGNOSIS — R05 Cough: Secondary | ICD-10-CM

## 2016-10-05 DIAGNOSIS — R059 Cough, unspecified: Secondary | ICD-10-CM

## 2016-10-05 MED ORDER — BENZONATATE 100 MG PO CAPS: 100.0000 mg | ORAL_CAPSULE | Freq: Three times a day (TID) | ORAL | 0 refills | Status: DC | PRN

## 2016-10-05 MED ORDER — AZITHROMYCIN 250 MG PO TABS: ORAL_TABLET | ORAL | 0 refills | Status: DC

## 2016-10-05 MED FILL — BENZONATATE 100 MG CAP: 100 | 6 days supply | Qty: 20 | Fill #0

## 2016-10-05 MED FILL — AZITHROMYCIN 250 MG TABLET: 250 | 5 days supply | Qty: 6 | Fill #0

## 2016-10-05 NOTE — Progress Notes (Signed)

## 2016-10-18 ENCOUNTER — Ambulatory Visit (INDEPENDENT_AMBULATORY_CARE_PROVIDER_SITE_OTHER): Payer: 59 | Admitting: Family Medicine

## 2016-10-18 ENCOUNTER — Other Ambulatory Visit: Payer: Self-pay | Admitting: Family Medicine

## 2016-10-18 ENCOUNTER — Encounter: Payer: Self-pay | Admitting: Family Medicine

## 2016-10-18 ENCOUNTER — Ambulatory Visit (HOSPITAL_BASED_OUTPATIENT_CLINIC_OR_DEPARTMENT_OTHER)
Admission: RE | Admit: 2016-10-18 | Discharge: 2016-10-18 | Disposition: A | Discharge status: Home / Self Care | Payer: 59 | Source: Ambulatory Visit | Attending: Family Medicine | Admitting: Family Medicine

## 2016-10-18 VITALS — BP 132/82 | HR 86 | Temp 97.8°F | Wt 333.2 lb

## 2016-10-18 DIAGNOSIS — J209 Acute bronchitis, unspecified: Secondary | ICD-10-CM | POA: Insufficient documentation

## 2016-10-18 DIAGNOSIS — E669 Obesity, unspecified: Secondary | ICD-10-CM | POA: Diagnosis not present

## 2016-10-18 DIAGNOSIS — R05 Cough: Secondary | ICD-10-CM | POA: Diagnosis not present

## 2016-10-18 DIAGNOSIS — R6889 Other general symptoms and signs: Secondary | ICD-10-CM

## 2016-10-18 DIAGNOSIS — I10 Essential (primary) hypertension: Secondary | ICD-10-CM

## 2016-10-18 LAB — POC INFLUENZA A&B (BINAX/QUICKVUE)
INFLUENZA A, POC: NEGATIVE
INFLUENZA B, POC: NEGATIVE

## 2016-10-18 MED ORDER — METHYLPREDNISOLONE ACETATE 40 MG/ML IJ SUSP: 40.0000 mg | Freq: Once | INTRAMUSCULAR | Status: DC

## 2016-10-18 MED ORDER — LEVOFLOXACIN 500 MG PO TABS: 500.0000 mg | ORAL_TABLET | Freq: Every day | ORAL | 0 refills | Status: DC

## 2016-10-18 MED ORDER — BENZONATATE 100 MG PO CAPS: 100.0000 mg | ORAL_CAPSULE | Freq: Two times a day (BID) | ORAL | 1 refills | Status: DC | PRN

## 2016-10-18 MED FILL — MONTELUKAST SOD 10 MG TAB: 10 | 30 days supply | Qty: 30 | Fill #2

## 2016-10-18 MED FILL — TRIAMTERENE-HCTZ 37.5-25 MG: 37.5-25 | 60 days supply | Qty: 60 | Fill #0

## 2016-10-18 MED FILL — BENZONATATE 100 MG CAP: 100 | 15 days supply | Qty: 60 | Fill #0

## 2016-10-18 MED FILL — levoFLOXacin 500 MG TABS: 500 | 10 days supply | Qty: 10 | Fill #0

## 2016-10-18 MED FILL — ALL DAY ALLERGY 10 MG TAB: 10 | 100 days supply | Qty: 100 | Fill #1

## 2016-10-18 MED FILL — ESCITALOPRAM 10 MG TABLET: 10 | 30 days supply | Qty: 30 | Fill #1

## 2016-10-18 MED FILL — ESCITALOPRAM 20 MG TABLET: 20 | 30 days supply | Qty: 30 | Fill #2

## 2016-10-18 NOTE — Progress Notes (Signed)
Pre visit review using our clinic review tool, if applicable. No additional management support is needed unless otherwise documented below in the visit note. 

## 2016-10-18 NOTE — Progress Notes (Signed)
Subjective:    Patient ID: Pamela Solis, female    DOB: 21-Aug-1961, 55 y.o.   MRN: [id number]  Chief Complaint  Patient presents with  . Follow-up    HPI Patient is in today for follow up on hypertension, obesity, and complaining of worsening respiratory symptoms. She has been ill most of the month and had an evisit on 1/10. Was prescribed an antibiotic but unfortunately continues to feel poorly after initially improving some. Endorses sore throat, malaise, myalgias, fatigue, cough productive of green sputum and head congestion. Denies CP/palp/SOB/HA/fevers/GI or GU c/o. Taking meds as prescribed  Past Medical History:  Diagnosis Date  . Acute bronchitis 01/21/2016  . Allergy   . Anemia   . Asthma   . Cervical cancer screening 06/25/2015  . Chronic kidney disease   . Depression   . Edema 11/24/2014  . Hypertension   . Obesity   . Preventative health care 07/31/2016  . Snoring 11/24/2014  . Thoracic outlet syndrome 11/24/2014    Past Surgical History:  Procedure Laterality Date  . CESAREAN SECTION    . ENDOMETRIAL ABLATION  2011  . TUBAL LIGATION      Family History  Problem Relation Age of Onset  . Obesity Mother   . Arthritis Mother     rheumatoid  . Stroke Father     Social History   Social History  . Marital status: Married    Spouse name: N/A  . Number of children: N/A  . Years of education: N/A   Occupational History  . Not on file.   Social History Main Topics  . Smoking status: Never Smoker  . Smokeless tobacco: Never Used  . Alcohol use No  . Drug use: No  . Sexual activity: Not on file     Comment: lives with husband and daughters, works for [employer] no dietary restrictions   Other Topics Concern  . Not on file   Social History Narrative  . No narrative on file    Outpatient Medications Prior to Visit  Medication Sig Dispense Refill  . albuterol (PROVENTIL HFA;VENTOLIN HFA) 108 (90 Base) MCG/ACT inhaler Inhale 2 puffs into the lungs every 6  (six) hours as needed. For wheezing 1 Inhaler 1  . ALL DAY ALLERGY 10 MG tablet TAKE 1 TABLET (10 MG TOTAL) BY MOUTH DAILY. 100 tablet 3  . budesonide-formoterol (SYMBICORT) 80-4.5 MCG/ACT inhaler Inhale 2 puffs into the lungs 2 (two) times daily. Begin using this inhaler once you have completed the oral prednisone dosing schedule. 1 Inhaler 0  . escitalopram (LEXAPRO) 10 MG tablet Take 1 tablet (10 mg total) by mouth at bedtime. 30 tablet 5  . escitalopram (LEXAPRO) 20 MG tablet TAKE 1 TABLET (20 MG) BY MOUTH DAILY. 30 tablet 5  . ibuprofen (ADVIL,MOTRIN) 800 MG tablet Take 1 tablet (800 mg total) by mouth every 8 (eight) hours as needed. For migraine 270 tablet 0  . montelukast (SINGULAIR) 10 MG tablet TAKE 1 TABLET (10 MG TOTAL) BY MOUTH DAILY. 30 tablet 6  . triamterene-hydrochlorothiazide (MAXZIDE-25) 37.5-25 MG tablet TAKE 1 TABLET BY MOUTH EVERY MORNING. 30 tablet 1  . benzonatate (TESSALON PERLES) 100 MG capsule Take 1 capsule (100 mg total) by mouth 3 (three) times daily as needed for cough. 20 capsule 0  . benzonatate (TESSALON) 100 MG capsule Take 1 capsule (100 mg total) by mouth 2 (two) times daily as needed for cough. 30 capsule 0  . azithromycin (ZITHROMAX Z-PAK) 250 MG tablet As directed (  Patient not taking: Reported on 10/18/2016) 6 tablet 0   No facility-administered medications prior to visit.     Allergies  Allergen Reactions  . Penicillins Rash    Review of Systems  Constitutional: Positive for chills and malaise/fatigue. Negative for fever.  HENT: Negative for congestion.   Eyes: Negative for blurred vision.  Respiratory: Positive for cough, shortness of breath and wheezing.   Cardiovascular: Negative for chest pain, palpitations and leg swelling.  Gastrointestinal: Negative for vomiting.  Musculoskeletal: Negative for back pain.  Skin: Negative for rash.  Neurological: Negative for loss of consciousness and headaches.       Objective:    Physical Exam    Constitutional: She is oriented to person, place, and time. She appears well-developed and well-nourished. No distress.  HENT:  Head: Normocephalic and atraumatic.  Eyes: Conjunctivae are normal.  Neck: Normal range of motion. No thyromegaly present.  Cardiovascular: Normal rate and regular rhythm.   Pulmonary/Chest: Effort normal. She has no wheezes.  Slight rhonchi rll  Abdominal: Soft. Bowel sounds are normal. There is no tenderness.  Musculoskeletal: Normal range of motion. She exhibits no edema or deformity.  Neurological: She is alert and oriented to person, place, and time.  Skin: Skin is warm and dry. She is not diaphoretic.  Psychiatric: She has a normal mood and affect.    BP 132/82 (BP Location: Left Arm, Patient Position: Sitting, Cuff Size: Normal)   Pulse 86   Temp 97.8 F (36.6 C) (Oral)   Wt (!) 333 lb 3.2 oz (151.1 kg)   SpO2 97%   BMI 53.78 kg/m  Wt Readings from Last 3 Encounters:  10/18/16 (!) 333 lb 3.2 oz (151.1 kg)  07/26/16 (!) 332 lb 12.8 oz (151 kg)  01/21/16 (!) 316 lb (143.3 kg)     Lab Results  Component Value Date   WBC 10.1 10/18/2016   HGB 14.6 10/18/2016   HCT 43.8 10/18/2016   PLT 320.0 10/18/2016   GLUCOSE 92 10/18/2016   CHOL 167 07/22/2016   TRIG 105 07/22/2016   HDL 57 07/22/2016   LDLCALC 89 07/22/2016   ALT 26 10/18/2016   AST 23 10/18/2016   NA 137 10/18/2016   K 4.2 10/18/2016   CL 103 10/18/2016   CREATININE 0.74 10/18/2016   BUN 8 10/18/2016   CO2 28 10/18/2016   TSH 1.47 07/22/2016   HGBA1C 5.7 (H) 01/26/2012    Lab Results  Component Value Date   TSH 1.47 07/22/2016   Lab Results  Component Value Date   WBC 10.1 10/18/2016   HGB 14.6 10/18/2016   HCT 43.8 10/18/2016   MCV 87.6 10/18/2016   PLT 320.0 10/18/2016   Lab Results  Component Value Date   NA 137 10/18/2016   K 4.2 10/18/2016   CO2 28 10/18/2016   GLUCOSE 92 10/18/2016   BUN 8 10/18/2016   CREATININE 0.74 10/18/2016   BILITOT 0.6  10/18/2016   ALKPHOS 104 10/18/2016   AST 23 10/18/2016   ALT 26 10/18/2016   PROT 7.7 10/18/2016   ALBUMIN 3.9 10/18/2016   CALCIUM 9.1 10/18/2016   GFR 87.14 10/18/2016   Lab Results  Component Value Date   CHOL 167 07/22/2016   Lab Results  Component Value Date   HDL 57 07/22/2016   Lab Results  Component Value Date   LDLCALC 89 07/22/2016   Lab Results  Component Value Date   TRIG 105 07/22/2016   Lab Results  Component Value Date  CHOLHDL 2.9 07/22/2016   Lab Results  Component Value Date   HGBA1C 5.7 (H) 01/26/2012      .I acted as a Education administrator for Dr. Charlett Blake. Princess, RMA  Assessment & Plan:   Problem List Items Addressed This Visit    HTN (hypertension)    Well controlled, no changes to meds. Encouraged heart healthy diet such as the DASH diet and exercise as tolerated.       Obesity    Encouraged DASH diet, decrease po intake and increase exercise as tolerated. Needs 7-8 hours of sleep nightly. Avoid trans fats, eat small, frequent meals every 4-5 hours with lean proteins, complex carbs and healthy fats. Minimize simple carbs, consider bariatric referral referred for further management, given contact information      Acute bronchitis    Encouraged increased rest and hydration, add probiotics, zinc such as Coldeze or Xicam. Treat fevers as needed. Started on Levaquin, elderberry, vitamin C and continue Mucinex. CXR negative for pneumonia      Relevant Medications   methylPREDNISolone acetate (DEPO-MEDROL) injection 40 mg   Other Relevant Orders   DG Chest 2 View (Completed)   CBC w/Diff (Completed)   Comp Met (CMET) (Completed)    Other Visit Diagnoses    Flu-like symptoms    -  Primary   Relevant Orders   POC Influenza A&B(BINAX/QUICKVUE) (Completed)      I have discontinued Ms. Brisbon's azithromycin and benzonatate. I have also changed her benzonatate. Additionally, I am having her start on levofloxacin. Lastly, I am having her maintain her  ibuprofen, escitalopram, montelukast, ALL DAY ALLERGY, budesonide-formoterol, albuterol, escitalopram, and triamterene-hydrochlorothiazide. We will continue to administer methylPREDNISolone acetate.  Meds ordered this encounter  Medications  . benzonatate (TESSALON) 100 MG capsule    Sig: Take 1-2 capsules (100-200 mg total) by mouth 2 (two) times daily as needed for cough.    Dispense:  60 capsule    Refill:  1  . levofloxacin (LEVAQUIN) 500 MG tablet    Sig: Take 1 tablet (500 mg total) by mouth daily.    Dispense:  10 tablet    Refill:  0  . methylPREDNISolone acetate (DEPO-MEDROL) injection 40 mg    CMA served as scribe during this visit. History, Physical and Plan performed by medical provider. Documentation and orders reviewed and attested to.  Penni Homans, MD

## 2016-10-18 NOTE — Patient Instructions (Signed)
Encouraged increased rest and hydration, add probiotics, zinc such as Coldeze or Xicam. Treat fevers as needed. Vitamin C 500 to 1000 mg daily, elderberry, aged or black garlic   Acute Bronchitis, Adult Acute bronchitis is when air tubes (bronchi) in the lungs suddenly get swollen. The condition can make it hard to breathe. It can also cause these symptoms:  A cough.  Coughing up clear, yellow, or green mucus.  Wheezing.  Chest congestion.  Shortness of breath.  A fever.  Body aches.  Chills.  A sore throat. Follow these instructions at home: Medicines  Take over-the-counter and prescription medicines only as told by your doctor.  If you were prescribed an antibiotic medicine, take it as told by your doctor. Do not stop taking the antibiotic even if you start to feel better. General instructions  Rest.  Drink enough fluids to keep your pee (urine) clear or pale yellow.  Avoid smoking and secondhand smoke. If you smoke and you need help quitting, ask your doctor. Quitting will help your lungs heal faster.  Use an inhaler, cool mist vaporizer, or humidifier as told by your doctor.  Keep all follow-up visits as told by your doctor. This is important. How is this prevented? To lower your risk of getting this condition again:  Wash your hands often with soap and water. If you cannot use soap and water, use hand sanitizer.  Avoid contact with people who have cold symptoms.  Try not to touch your hands to your mouth, nose, or eyes.  Make sure to get the flu shot every year. Contact a doctor if:  Your symptoms do not get better in 2 weeks. Get help right away if:  You cough up blood.  You have chest pain.  You have very bad shortness of breath.  You become dehydrated.  You faint (pass out) or keep feeling like you are going to pass out.  You keep throwing up (vomiting).  You have a very bad headache.  Your fever or chills gets worse. This information is  not intended to replace advice given to you by your health care provider. Make sure you discuss any questions you have with your health care provider. Document Released: 02/29/2008 Document Revised: 04/20/2016 Document Reviewed: 03/02/2016 Elsevier Interactive Patient Education  2017 [employer]

## 2016-10-19 LAB — COMPREHENSIVE METABOLIC PANEL
ALT: 26 U/L (ref 0–35)
AST: 23 U/L (ref 0–37)
Albumin: 3.9 g/dL (ref 3.5–5.2)
Alkaline Phosphatase: 104 U/L (ref 39–117)
BILIRUBIN TOTAL: 0.6 mg/dL (ref 0.2–1.2)
BUN: 8 mg/dL (ref 6–23)
CO2: 28 meq/L (ref 19–32)
Calcium: 9.1 mg/dL (ref 8.4–10.5)
Chloride: 103 mEq/L (ref 96–112)
Creatinine, Ser: 0.74 mg/dL (ref 0.40–1.20)
GFR: 87.14 mL/min (ref >60.00)
Glucose, Bld: 92 mg/dL (ref 70–99)
POTASSIUM: 4.2 meq/L (ref 3.5–5.1)
Sodium: 137 mEq/L (ref 135–145)
Total Protein: 7.7 g/dL (ref 6.0–8.3)

## 2016-10-19 LAB — CBC WITH DIFFERENTIAL/PLATELET
BASOS ABS: 0.1 10*3/uL (ref 0.0–0.1)
Basophils Relative: 0.8 % (ref 0.0–3.0)
Eosinophils Absolute: 0.5 10*3/uL (ref 0.0–0.7)
Eosinophils Relative: 4.6 % (ref 0.0–5.0)
HCT: 43.8 % (ref 36.0–46.0)
Hemoglobin: 14.6 g/dL (ref 12.0–15.0)
LYMPHS ABS: 2.5 10*3/uL (ref 0.7–4.0)
Lymphocytes Relative: 24.4 % (ref 12.0–46.0)
MCHC: 33.4 g/dL (ref 30.0–36.0)
MCV: 87.6 fl (ref 78.0–100.0)
MONOS PCT: 4.2 % (ref 3.0–12.0)
Monocytes Absolute: 0.4 10*3/uL (ref 0.1–1.0)
NEUTROS ABS: 6.6 10*3/uL (ref 1.4–7.7)
NEUTROS PCT: 66 % (ref 43.0–77.0)
PLATELETS: 320 10*3/uL (ref 150.0–400.0)
RBC: 5 Mil/uL (ref 3.87–5.11)
RDW: 12.9 % (ref 11.5–15.5)
WBC: 10.1 10*3/uL (ref 4.0–10.5)

## 2016-10-20 NOTE — Assessment & Plan Note (Signed)
Well controlled, no changes to meds. Encouraged heart healthy diet such as the DASH diet and exercise as tolerated.  °

## 2016-10-20 NOTE — Assessment & Plan Note (Signed)
Encouraged DASH diet, decrease po intake and increase exercise as tolerated. Needs 7-8 hours of sleep nightly. Avoid trans fats, eat small, frequent meals every 4-5 hours with lean proteins, complex carbs and healthy fats. Minimize simple carbs, consider bariatric referral referred for further management, given contact information

## 2016-10-20 NOTE — Assessment & Plan Note (Addendum)
Encouraged increased rest and hydration, add probiotics, zinc such as Coldeze or Xicam. Treat fevers as needed. Started on Levaquin, elderberry, vitamin C and continue Mucinex. CXR negative for pneumonia

## 2016-11-08 DIAGNOSIS — H524 Presbyopia: Secondary | ICD-10-CM | POA: Diagnosis not present

## 2016-11-08 DIAGNOSIS — H52223 Regular astigmatism, bilateral: Secondary | ICD-10-CM | POA: Diagnosis not present

## 2016-11-08 DIAGNOSIS — H5203 Hypermetropia, bilateral: Secondary | ICD-10-CM | POA: Diagnosis not present

## 2017-01-06 MED FILL — MONTELUKAST SOD 10 MG TAB: 10 | 30 days supply | Qty: 30 | Fill #3

## 2017-01-06 MED FILL — ESCITALOPRAM 20 MG TABLET: 20 | 30 days supply | Qty: 30 | Fill #3

## 2017-01-31 ENCOUNTER — Ambulatory Visit (INDEPENDENT_AMBULATORY_CARE_PROVIDER_SITE_OTHER): Payer: 59 | Admitting: Family Medicine

## 2017-01-31 ENCOUNTER — Encounter: Payer: Self-pay | Admitting: Family Medicine

## 2017-01-31 VITALS — BP 132/82 | HR 77 | Temp 97.9°F | Resp 18 | Wt 326.6 lb

## 2017-01-31 DIAGNOSIS — F329 Major depressive disorder, single episode, unspecified: Secondary | ICD-10-CM

## 2017-01-31 DIAGNOSIS — I10 Essential (primary) hypertension: Secondary | ICD-10-CM | POA: Diagnosis not present

## 2017-01-31 DIAGNOSIS — F32A Depression, unspecified: Secondary | ICD-10-CM

## 2017-01-31 DIAGNOSIS — T7840XD Allergy, unspecified, subsequent encounter: Secondary | ICD-10-CM | POA: Diagnosis not present

## 2017-01-31 DIAGNOSIS — J45909 Unspecified asthma, uncomplicated: Secondary | ICD-10-CM

## 2017-01-31 DIAGNOSIS — R000 Tachycardia, unspecified: Secondary | ICD-10-CM

## 2017-01-31 DIAGNOSIS — T7840XA Allergy, unspecified, initial encounter: Secondary | ICD-10-CM

## 2017-01-31 DIAGNOSIS — E785 Hyperlipidemia, unspecified: Secondary | ICD-10-CM | POA: Diagnosis not present

## 2017-01-31 HISTORY — DX: Allergy, unspecified, initial encounter: T78.40XA

## 2017-01-31 NOTE — Assessment & Plan Note (Signed)
Doing well on Lexapro

## 2017-01-31 NOTE — Progress Notes (Signed)
Pre visit review using our clinic review tool, if applicable. No additional management support is needed unless otherwise documented below in the visit note. 

## 2017-01-31 NOTE — Progress Notes (Signed)
Subjective:  I acted as a Neurosurgeonscribe for Dr. Abner GreenspanBlyth. Princess, [place]   Patient ID: Pamela Solis, female    DOB: 11-19-1960, 56 y.o.   MRN: [id number]  Chief Complaint  Patient presents with  . Follow-up  . Hypertension    HPI  Patient is in today for a 6 month follow up. Following up on HTN, and other medical concerns. Patient has no acute concerns. She feels well. No recent febrile illness or hospitalizations. She acknowledges she did not proceed with cologuard and is seeing some blood on tissue. She continues to decline colonoscopy but agrees to proceed with cologuard. Denies CP/palp/SOB/HA/congestion/fevers or GU c/o. Taking meds as prescribed  Patient Care Team: Bradd CanaryBlyth, Stacey A, MD as PCP - General (Family Medicine)   Past Medical History:  Diagnosis Date  . Acute bronchitis 01/21/2016  . Allergic state 01/31/2017  . Allergy   . Anemia   . Asthma   . Cervical cancer screening 06/25/2015  . Chronic kidney disease   . Depression   . Edema 11/24/2014  . Hypertension   . Obesity   . Preventative health care 07/31/2016  . Snoring 11/24/2014  . Thoracic outlet syndrome 11/24/2014    Past Surgical History:  Procedure Laterality Date  . CESAREAN SECTION    . ENDOMETRIAL ABLATION  2011  . TUBAL LIGATION      Family History  Problem Relation Age of Onset  . Obesity Mother   . Arthritis Mother     rheumatoid  . Stroke Father     Social History   Social History  . Marital status: Married    Spouse name: N/A  . Number of children: N/A  . Years of education: N/A   Occupational History  . Not on file.   Social History Main Topics  . Smoking status: Never Smoker  . Smokeless tobacco: Never Used  . Alcohol use No  . Drug use: No  . Sexual activity: Not on file     Comment: lives with husband and daughters, works for [employer] no dietary restrictions   Other Topics Concern  . Not on file   Social History Narrative  . No narrative on file    Outpatient Medications Prior  to Visit  Medication Sig Dispense Refill  . albuterol (PROVENTIL HFA;VENTOLIN HFA) 108 (90 Base) MCG/ACT inhaler Inhale 2 puffs into the lungs every 6 (six) hours as needed. For wheezing 1 Inhaler 1  . ALL DAY ALLERGY 10 MG tablet TAKE 1 TABLET (10 MG TOTAL) BY MOUTH DAILY. 100 tablet 3  . budesonide-formoterol (SYMBICORT) 80-4.5 MCG/ACT inhaler Inhale 2 puffs into the lungs 2 (two) times daily. Begin using this inhaler once you have completed the oral prednisone dosing schedule. 1 Inhaler 0  . escitalopram (LEXAPRO) 10 MG tablet Take 1 tablet (10 mg total) by mouth at bedtime. 30 tablet 5  . escitalopram (LEXAPRO) 20 MG tablet TAKE 1 TABLET (20 MG) BY MOUTH DAILY. 30 tablet 5  . ibuprofen (ADVIL,MOTRIN) 800 MG tablet Take 1 tablet (800 mg total) by mouth every 8 (eight) hours as needed. For migraine 270 tablet 0  . montelukast (SINGULAIR) 10 MG tablet TAKE 1 TABLET (10 MG TOTAL) BY MOUTH DAILY. 30 tablet 6  . triamterene-hydrochlorothiazide (MAXZIDE-25) 37.5-25 MG tablet TAKE 1 TABLET BY MOUTH EVERY MORNING. 30 tablet 1  . benzonatate (TESSALON) 100 MG capsule Take 1-2 capsules (100-200 mg total) by mouth 2 (two) times daily as needed for cough. 60 capsule 1  . levofloxacin (  LEVAQUIN) 500 MG tablet Take 1 tablet (500 mg total) by mouth daily. 10 tablet 0  . methylPREDNISolone acetate (DEPO-MEDROL) injection 40 mg      No facility-administered medications prior to visit.     Allergies  Allergen Reactions  . Penicillins Rash    Review of Systems  Constitutional: Negative for fever and malaise/fatigue.  HENT: Negative for congestion.   Eyes: Negative for blurred vision.  Respiratory: Negative for shortness of breath.   Cardiovascular: Negative for chest pain, palpitations and leg swelling.  Gastrointestinal: Negative for abdominal pain, blood in stool and nausea.  Genitourinary: Negative for dysuria and frequency.  Musculoskeletal: Negative for falls.  Skin: Negative for rash.    Neurological: Negative for dizziness, loss of consciousness and headaches.  Endo/Heme/Allergies: Negative for environmental allergies.  Psychiatric/Behavioral: Negative for depression. The patient is not nervous/anxious.        Objective:    Physical Exam  Constitutional: She is oriented to person, place, and time. She appears well-developed and well-nourished. No distress.  HENT:  Head: Normocephalic and atraumatic.  Nose: Nose normal.  Eyes: Right eye exhibits no discharge. Left eye exhibits no discharge.  Neck: Normal range of motion. Neck supple.  Cardiovascular: Normal rate and regular rhythm.   No murmur heard. Pulmonary/Chest: Effort normal and breath sounds normal.  Abdominal: Soft. Bowel sounds are normal. There is no tenderness.  Musculoskeletal: She exhibits no edema.  Neurological: She is alert and oriented to person, place, and time.  Skin: Skin is warm and dry.  Psychiatric: She has a normal mood and affect.  Nursing note and vitals reviewed.   BP 132/82 (BP Location: Left Wrist, Patient Position: Sitting, Cuff Size: Normal)   Pulse 77   Temp 97.9 F (36.6 C) (Oral)   Resp 18   Wt (!) 326 lb 9.6 oz (148.1 kg)   SpO2 95%   BMI 52.71 kg/m  Wt Readings from Last 3 Encounters:  01/31/17 (!) 326 lb 9.6 oz (148.1 kg)  10/18/16 (!) 333 lb 3.2 oz (151.1 kg)  07/26/16 (!) 332 lb 12.8 oz (151 kg)   BP Readings from Last 3 Encounters:  01/31/17 132/82  10/18/16 132/82  07/26/16 132/82     Immunization History  Administered Date(s) Administered  . Influenza-Unspecified 06/19/2015  . Tdap 07/26/2016    Health Maintenance  Topic Date Due  . HIV Screening  06/15/1978  . COLONOSCOPY  06/15/2013  . INFLUENZA VACCINE  04/26/2017  . MAMMOGRAM  07/06/2017  . PAP SMEAR  06/24/2018  . TETANUS/TDAP  07/26/2026  . Hepatitis C Screening  Completed    Lab Results  Component Value Date   WBC 10.1 10/18/2016   HGB 14.6 10/18/2016   HCT 43.8 10/18/2016   PLT  320.0 10/18/2016   GLUCOSE 92 10/18/2016   CHOL 167 07/22/2016   TRIG 105 07/22/2016   HDL 57 07/22/2016   LDLCALC 89 07/22/2016   ALT 26 10/18/2016   AST 23 10/18/2016   NA 137 10/18/2016   K 4.2 10/18/2016   CL 103 10/18/2016   CREATININE 0.74 10/18/2016   BUN 8 10/18/2016   CO2 28 10/18/2016   TSH 1.47 07/22/2016   HGBA1C 5.7 (H) 01/26/2012    Lab Results  Component Value Date   TSH 1.47 07/22/2016   Lab Results  Component Value Date   WBC 10.1 10/18/2016   HGB 14.6 10/18/2016   HCT 43.8 10/18/2016   MCV 87.6 10/18/2016   PLT 320.0 10/18/2016   Lab  Results  Component Value Date   NA 137 10/18/2016   K 4.2 10/18/2016   CO2 28 10/18/2016   GLUCOSE 92 10/18/2016   BUN 8 10/18/2016   CREATININE 0.74 10/18/2016   BILITOT 0.6 10/18/2016   ALKPHOS 104 10/18/2016   AST 23 10/18/2016   ALT 26 10/18/2016   PROT 7.7 10/18/2016   ALBUMIN 3.9 10/18/2016   CALCIUM 9.1 10/18/2016   GFR 87.14 10/18/2016   Lab Results  Component Value Date   CHOL 167 07/22/2016   Lab Results  Component Value Date   HDL 57 07/22/2016   Lab Results  Component Value Date   LDLCALC 89 07/22/2016   Lab Results  Component Value Date   TRIG 105 07/22/2016   Lab Results  Component Value Date   CHOLHDL 2.9 07/22/2016   Lab Results  Component Value Date   HGBA1C 5.7 (H) 01/26/2012         Assessment & Plan:   Problem List Items Addressed This Visit    Asthma    Uses Symbicort very infrequently and nonly as needed. The singulair has helped control symptoms. Albuterol is only prn rarely      HTN (hypertension)    Well controlled, no changes to meds. Encouraged heart healthy diet such as the DASH diet and exercise as tolerated.       Depression    Doing well on Lexapro      Tachycardia    RRR      Allergic state    Zyrtec and Singulair daily helps most days. Will use Symbicort when congested and starts wheezing and coughing         I have discontinued Ms.  Busbin's benzonatate and levofloxacin. I am also having her maintain her ibuprofen, escitalopram, montelukast, ALL DAY ALLERGY, budesonide-formoterol, albuterol, escitalopram, and triamterene-hydrochlorothiazide. We will stop administering methylPREDNISolone acetate.  No orders of the defined types were placed in this encounter.   CMA served as Neurosurgeon during this visit. History, Physical and Plan performed by medical provider. Documentation and orders reviewed and attested to.  [name], MD

## 2017-01-31 NOTE — Assessment & Plan Note (Signed)
Well controlled, no changes to meds. Encouraged heart healthy diet such as the DASH diet and exercise as tolerated.  °

## 2017-01-31 NOTE — Assessment & Plan Note (Signed)
RRR 

## 2017-01-31 NOTE — Assessment & Plan Note (Signed)
Zyrtec and Singulair daily helps most days. Will use Symbicort when congested and starts wheezing and coughing

## 2017-01-31 NOTE — Patient Instructions (Signed)

## 2017-01-31 NOTE — Assessment & Plan Note (Signed)
Uses Symbicort very infrequently and nonly as needed. The singulair has helped control symptoms. Albuterol is only prn rarely

## 2017-03-23 ENCOUNTER — Other Ambulatory Visit: Payer: Self-pay | Admitting: Family Medicine

## 2017-03-23 MED FILL — TRIAMTERENE-HCTZ 37.5-25 MG: 37.5-25 | 90 days supply | Qty: 90 | Fill #0

## 2017-03-23 MED FILL — MONTELUKAST SOD 10 MG TAB: 10 | 30 days supply | Qty: 30 | Fill #4

## 2017-03-23 MED FILL — ESCITALOPRAM 20 MG TABLET: 20 | 30 days supply | Qty: 30 | Fill #0

## 2017-05-18 ENCOUNTER — Other Ambulatory Visit: Payer: Self-pay | Admitting: Family Medicine

## 2017-05-18 MED FILL — MONTELUKAST SOD 10 MG TAB: 10 | 30 days supply | Qty: 30 | Fill #0

## 2017-06-22 ENCOUNTER — Other Ambulatory Visit: Payer: Self-pay | Admitting: Family Medicine

## 2017-06-22 MED FILL — TRIAMTERENE-HCTZ 37.5-25 MG: 37.5-25 | 90 days supply | Qty: 90 | Fill #1

## 2017-06-22 MED FILL — ESCITALOPRAM 20 MG TABLET: 20 | 30 days supply | Qty: 30 | Fill #1

## 2017-06-22 MED FILL — MONTELUKAST SOD 10 MG TAB: 10 | 30 days supply | Qty: 30 | Fill #1

## 2017-06-23 MED FILL — ALL DAY ALLERGY 10 MG TAB: 10 | 100 days supply | Qty: 100 | Fill #0

## 2017-07-24 ENCOUNTER — Other Ambulatory Visit (INDEPENDENT_AMBULATORY_CARE_PROVIDER_SITE_OTHER): Payer: 59

## 2017-07-24 DIAGNOSIS — E785 Hyperlipidemia, unspecified: Secondary | ICD-10-CM | POA: Diagnosis not present

## 2017-07-24 DIAGNOSIS — I10 Essential (primary) hypertension: Secondary | ICD-10-CM

## 2017-07-24 LAB — COMPREHENSIVE METABOLIC PANEL
ALK PHOS: 92 U/L (ref 39–117)
ALT: 35 U/L (ref 0–35)
AST: 38 U/L — AB (ref 0–37)
Albumin: 3.7 g/dL (ref 3.5–5.2)
BILIRUBIN TOTAL: 0.6 mg/dL (ref 0.2–1.2)
BUN: 12 mg/dL (ref 6–23)
CALCIUM: 9.2 mg/dL (ref 8.4–10.5)
CO2: 28 meq/L (ref 19–32)
CREATININE: 0.68 mg/dL (ref 0.40–1.20)
Chloride: 100 mEq/L (ref 96–112)
GFR: 95.8 mL/min (ref >60.00)
GLUCOSE: 91 mg/dL (ref 70–99)
Potassium: 3.8 mEq/L (ref 3.5–5.1)
Sodium: 136 mEq/L (ref 135–145)
TOTAL PROTEIN: 7.4 g/dL (ref 6.0–8.3)

## 2017-07-24 LAB — CBC
HCT: 43.7 % (ref 36.0–46.0)
HEMOGLOBIN: 14.3 g/dL (ref 12.0–15.0)
MCHC: 32.7 g/dL (ref 30.0–36.0)
MCV: 90.4 fl (ref 78.0–100.0)
PLATELETS: 357 10*3/uL (ref 150.0–400.0)
RBC: 4.83 Mil/uL (ref 3.87–5.11)
RDW: 12.3 % (ref 11.5–15.5)
WBC: 8.3 10*3/uL (ref 4.0–10.5)

## 2017-07-24 LAB — LIPID PANEL
CHOL/HDL RATIO: 3
Cholesterol: 160 mg/dL (ref 0–200)
HDL: 50.4 mg/dL (ref >39.00)
LDL Cholesterol: 88 mg/dL (ref 0–99)
NONHDL: 110.08
Triglycerides: 108 mg/dL (ref 0.0–149.0)
VLDL: 21.6 mg/dL (ref 0.0–40.0)

## 2017-07-24 LAB — TSH: TSH: 2.37 u[IU]/mL (ref 0.35–4.50)

## 2017-07-25 ENCOUNTER — Other Ambulatory Visit: Payer: 59

## 2017-08-01 ENCOUNTER — Encounter: Payer: 59 | Admitting: Family Medicine

## 2017-08-03 DIAGNOSIS — H40011 Open angle with borderline findings, low risk, right eye: Secondary | ICD-10-CM | POA: Diagnosis not present

## 2017-08-03 DIAGNOSIS — H40003 Preglaucoma, unspecified, bilateral: Secondary | ICD-10-CM | POA: Diagnosis not present

## 2017-08-18 MED FILL — ESCITALOPRAM 20 MG TABLET: 20 | 30 days supply | Qty: 30 | Fill #2

## 2017-09-13 IMAGING — DX DG CHEST 2V
2 series · 2 of 2 positions shown · non-contrast
Comparison: 01/24/2012

CLINICAL DATA: Cough, congestion for 2 weeks

EXAM:
CHEST  2 VIEW

[chest pa]
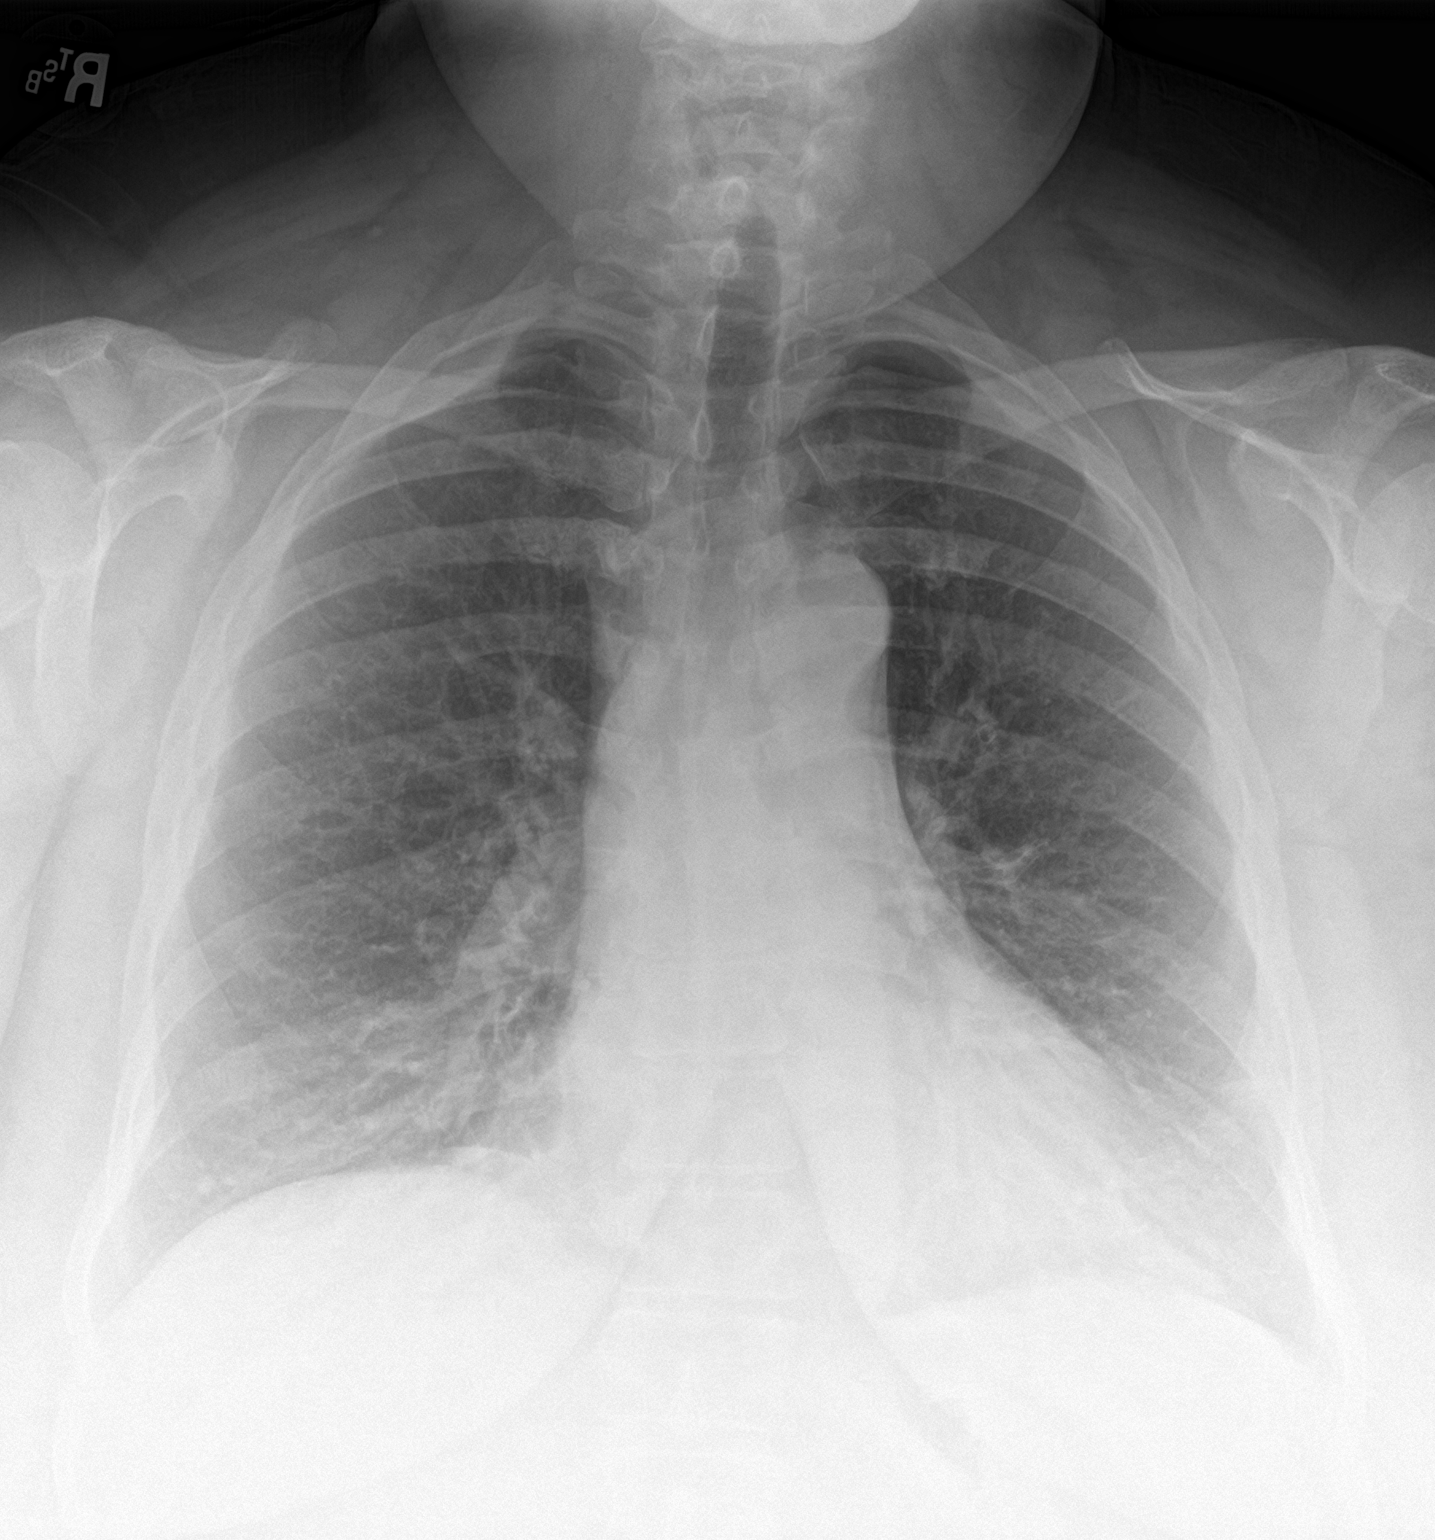

[chest lat]
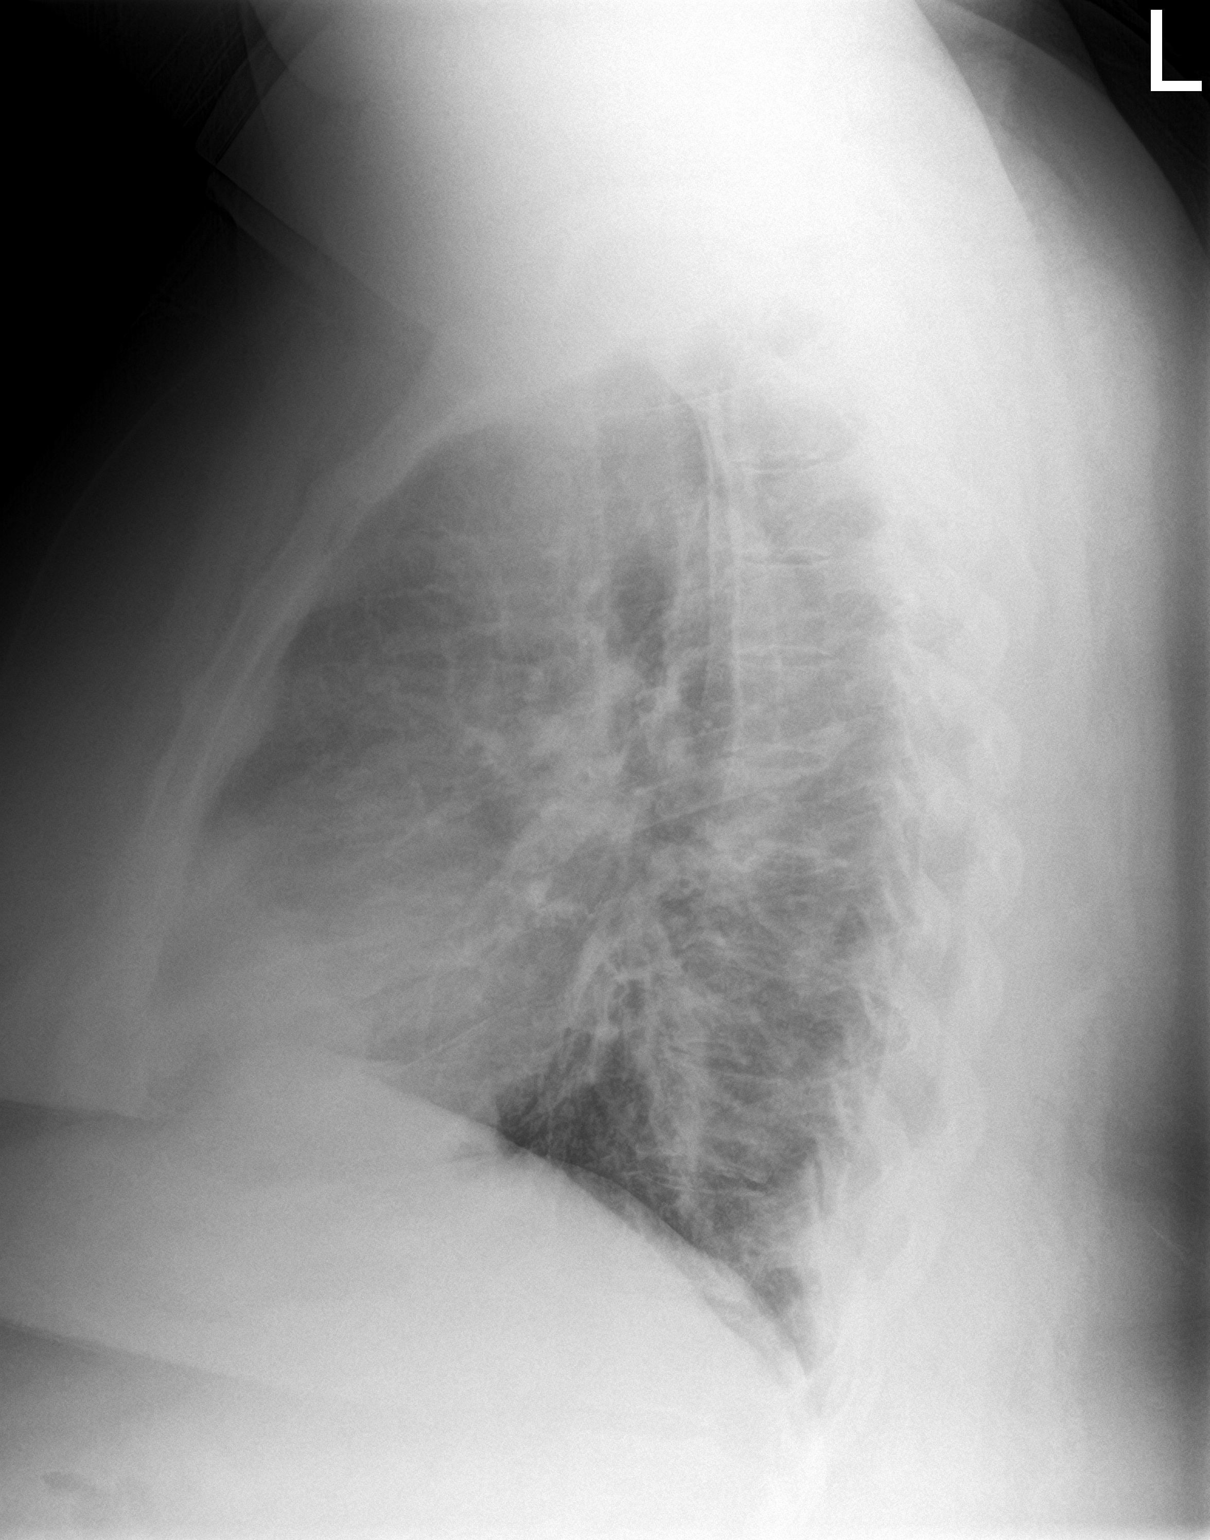

[2 of 2 positions shown; findings below may reference images not displayed]

FINDINGS: Heart is upper limits normal in size. No confluent airspace
opacities or effusions. No acute bony abnormality.
IMPRESSION: No active cardiopulmonary disease.

## 2017-10-04 MED FILL — ESCITALOPRAM 20 MG TABLET: 20 | 30 days supply | Qty: 30 | Fill #3

## 2017-10-05 ENCOUNTER — Encounter: Payer: 59 | Admitting: Family Medicine

## 2017-10-05 DIAGNOSIS — Z0289 Encounter for other administrative examinations: Secondary | ICD-10-CM

## 2017-10-09 MED FILL — MONTELUKAST SOD 10 MG TAB: 10 | 30 days supply | Qty: 30 | Fill #2

## 2017-11-23 ENCOUNTER — Encounter: Payer: 59 | Admitting: Family Medicine

## 2017-11-30 DIAGNOSIS — H524 Presbyopia: Secondary | ICD-10-CM | POA: Diagnosis not present

## 2017-11-30 DIAGNOSIS — H5203 Hypermetropia, bilateral: Secondary | ICD-10-CM | POA: Diagnosis not present

## 2017-11-30 DIAGNOSIS — H52223 Regular astigmatism, bilateral: Secondary | ICD-10-CM | POA: Diagnosis not present

## 2018-01-02 ENCOUNTER — Encounter: Payer: 59 | Admitting: Family Medicine

## 2018-01-02 DIAGNOSIS — Z0289 Encounter for other administrative examinations: Secondary | ICD-10-CM

## 2018-02-02 ENCOUNTER — Other Ambulatory Visit: Payer: Self-pay | Admitting: Family Medicine

## 2018-02-02 MED FILL — CETIRIZINE HCL 10 MG TABLET: 10 | 100 days supply | Qty: 100 | Fill #1

## 2018-02-02 MED FILL — ESCITALOPRAM 20 MG TABLET: 20 | 30 days supply | Qty: 30 | Fill #4

## 2018-02-02 MED FILL — MONTELUKAST SOD 10 MG TAB: 10 | 30 days supply | Qty: 30 | Fill #0

## 2018-02-22 ENCOUNTER — Other Ambulatory Visit: Payer: Self-pay | Admitting: Family Medicine

## 2018-02-22 MED FILL — SYMBICORT 80-4.5 MCG INH: 80-4.5 | 30 days supply | Qty: 10 | Fill #0

## 2018-03-09 ENCOUNTER — Ambulatory Visit (HOSPITAL_BASED_OUTPATIENT_CLINIC_OR_DEPARTMENT_OTHER)
Admission: RE | Admit: 2018-03-09 | Discharge: 2018-03-09 | Disposition: A | Discharge status: Home / Self Care | Payer: 59 | Source: Ambulatory Visit | Attending: Medical | Admitting: Medical

## 2018-03-09 ENCOUNTER — Ambulatory Visit (INDEPENDENT_AMBULATORY_CARE_PROVIDER_SITE_OTHER): Payer: 59 | Admitting: Medical

## 2018-03-09 ENCOUNTER — Encounter: Payer: Self-pay | Admitting: Medical

## 2018-03-09 VITALS — BP 128/72 | HR 88 | Temp 98.1°F | Resp 16 | Ht 66.0 in | Wt 326.4 lb

## 2018-03-09 DIAGNOSIS — J40 Bronchitis, not specified as acute or chronic: Secondary | ICD-10-CM

## 2018-03-09 DIAGNOSIS — J984 Other disorders of lung: Secondary | ICD-10-CM | POA: Diagnosis not present

## 2018-03-09 DIAGNOSIS — R739 Hyperglycemia, unspecified: Secondary | ICD-10-CM | POA: Diagnosis not present

## 2018-03-09 DIAGNOSIS — J0100 Acute maxillary sinusitis, unspecified: Secondary | ICD-10-CM

## 2018-03-09 DIAGNOSIS — R6883 Chills (without fever): Secondary | ICD-10-CM

## 2018-03-09 DIAGNOSIS — R059 Cough, unspecified: Secondary | ICD-10-CM

## 2018-03-09 DIAGNOSIS — J029 Acute pharyngitis, unspecified: Secondary | ICD-10-CM

## 2018-03-09 DIAGNOSIS — R05 Cough: Secondary | ICD-10-CM | POA: Insufficient documentation

## 2018-03-09 LAB — HEMOGLOBIN A1C: Hgb A1c MFr Bld: 5.7 % (ref 4.6–6.5)

## 2018-03-09 LAB — CBC WITH DIFFERENTIAL/PLATELET
BASOS ABS: 0.1 10*3/uL (ref 0.0–0.1)
BASOS PCT: 0.8 % (ref 0.0–3.0)
EOS ABS: 0.3 10*3/uL (ref 0.0–0.7)
Eosinophils Relative: 2.7 % (ref 0.0–5.0)
HEMATOCRIT: 43 % (ref 36.0–46.0)
HEMOGLOBIN: 14.2 g/dL (ref 12.0–15.0)
LYMPHS PCT: 14.6 % (ref 12.0–46.0)
Lymphs Abs: 1.8 10*3/uL (ref 0.7–4.0)
MCHC: 33.1 g/dL (ref 30.0–36.0)
MCV: 90.1 fl (ref 78.0–100.0)
MONOS PCT: 7 % (ref 3.0–12.0)
Monocytes Absolute: 0.9 10*3/uL (ref 0.1–1.0)
Neutro Abs: 9.2 10*3/uL — ABNORMAL HIGH (ref 1.4–7.7)
Neutrophils Relative %: 74.9 % (ref 43.0–77.0)
Platelets: 319 10*3/uL (ref 150.0–400.0)
RBC: 4.78 Mil/uL (ref 3.87–5.11)
RDW: 12.7 % (ref 11.5–15.5)
WBC: 12.4 10*3/uL — AB (ref 4.0–10.5)

## 2018-03-09 LAB — POCT RAPID STREP A (OFFICE): Rapid Strep A Screen: NEGATIVE

## 2018-03-09 MED ORDER — BENZONATATE 100 MG PO CAPS: 100.0000 mg | ORAL_CAPSULE | Freq: Three times a day (TID) | ORAL | 0 refills | Status: DC | PRN

## 2018-03-09 MED ORDER — FLUTICASONE PROPIONATE 50 MCG/ACT NA SUSP: 2.0000 | Freq: Every day | NASAL | 1 refills | Status: DC

## 2018-03-09 MED ORDER — DOXYCYCLINE HYCLATE 100 MG PO TABS: 100.0000 mg | ORAL_TABLET | Freq: Two times a day (BID) | ORAL | 0 refills | Status: DC

## 2018-03-09 MED FILL — DOXYCYCLINE HYCLATE 100 MG: 100 | 10 days supply | Qty: 20 | Fill #0

## 2018-03-09 MED FILL — FLUTICASONE PROP 50 MCG SPR: 50 | 30 days supply | Qty: 16 | Fill #0

## 2018-03-09 MED FILL — BENZONATATE 100 MG CAPSULE: 100 | 10 days supply | Qty: 30 | Fill #0

## 2018-03-09 NOTE — Patient Instructions (Signed)
You appear to have bronchitis and sinusitis(with st but rapid strep negative. Rest hydrate and tylenol for fever. I am prescribing cough medicine benzonatate, and doxycycline  antibiotic. For your nasal congestion flonase.  For recent minimal wheezing, please restart your Symbicort daily.  Use albuterol if needed.  If wheezing worsens then might need to add tapered dose prednisone.  Please get labs/CBC and A1c today.  Also get chest x-ray.  Follow up in 7-10 days or as needed

## 2018-03-09 NOTE — Progress Notes (Signed)
Subjective:    Patient ID: Pamela Solis, female    DOB: 11/12/1960, 57 y.o.   MRN: [id number]  HPI   Pt in with st for 2 days. The pain is progressively worse(moderate st). Some nasal congestion ans sinus pressure as well. She is blowing out thick green mucus from nose. Also coughing up mucus. Feeling chest congestion.  Pt is wheezing some at times. No severe presently. Pt is on symbicort and albuterol as needed.(has not been using recently) She does use singulair daily.   No history of diabetes. October cmp looked good. No elevated sugar in October. But 6 years ago sugar of 177.   Review of Systems  Constitutional: Positive for chills and fatigue. Negative for fever.  HENT: Positive for congestion, sinus pressure, sinus pain and sore throat. Negative for sneezing and trouble swallowing.   Respiratory: Positive for cough and wheezing. Negative for shortness of breath.   Cardiovascular: Negative for chest pain and palpitations.  Musculoskeletal: Negative for arthralgias, back pain and myalgias.  Neurological: Negative for dizziness, speech difficulty, weakness, light-headedness and numbness.  Hematological: Negative for adenopathy. Does not bruise/bleed easily.    Past Medical History:  Diagnosis Date  . Acute bronchitis 01/21/2016  . Allergic state 01/31/2017  . Allergy   . Anemia   . Asthma   . Cervical cancer screening 06/25/2015  . Chronic kidney disease   . Depression   . Edema 11/24/2014  . Hypertension   . Obesity   . Preventative health care 07/31/2016  . Snoring 11/24/2014  . Thoracic outlet syndrome 11/24/2014     Social History   Socioeconomic History  . Marital status: Married    Spouse name: Not on file  . Number of children: Not on file  . Years of education: Not on file  . Highest education level: Not on file  Occupational History  . Not on file  Social Needs  . Financial resource strain: Not on file  . Food insecurity:    Worry: Not on file   Inability: Not on file  . Transportation needs:    Medical: Not on file    Non-medical: Not on file  Tobacco Use  . Smoking status: Never Smoker  . Smokeless tobacco: Never Used  Substance and Sexual Activity  . Alcohol use: No  . Drug use: No  . Sexual activity: Not on file    Comment: lives with husband and daughters, works for [employer] no dietary restrictions  Lifestyle  . Physical activity:    Days per week: Not on file    Minutes per session: Not on file  . Stress: Not on file  Relationships  . Social connections:    Talks on phone: Not on file    Gets together: Not on file    Attends religious service: Not on file    Active member of club or organization: Not on file    Attends meetings of clubs or organizations: Not on file    Relationship status: Not on file  . Intimate partner violence:    Fear of current or ex partner: Not on file    Emotionally abused: Not on file    Physically abused: Not on file    Forced sexual activity: Not on file  Other Topics Concern  . Not on file  Social History Narrative  . Not on file    Past Surgical History:  Procedure Laterality Date  . CESAREAN SECTION    . ENDOMETRIAL ABLATION  2011  .  TUBAL LIGATION      Family History  Problem Relation Age of Onset  . Obesity Mother   . Arthritis Mother        rheumatoid  . Stroke Father     Allergies  Allergen Reactions  . Penicillins Rash    Current Outpatient Medications on File Prior to Visit  Medication Sig Dispense Refill  . albuterol (PROVENTIL HFA;VENTOLIN HFA) 108 (90 Base) MCG/ACT inhaler Inhale 2 puffs into the lungs every 6 (six) hours as needed. For wheezing 1 Inhaler 1  . ALL DAY ALLERGY 10 MG tablet TAKE 1 TABLET (10 MG TOTAL) BY MOUTH DAILY. 100 tablet 3  . escitalopram (LEXAPRO) 20 MG tablet Take 1 tablet (20 mg total) by mouth daily. 30 tablet 5  . ibuprofen (ADVIL,MOTRIN) 800 MG tablet Take 1 tablet (800 mg total) by mouth every 8 (eight) hours as needed. For  migraine 270 tablet 0  . montelukast (SINGULAIR) 10 MG tablet TAKE 1 TABLET (10 MG TOTAL) BY MOUTH DAILY. 30 tablet 2  . SYMBICORT 80-4.5 MCG/ACT inhaler INHALE 2 PUFFS BY MOUTH INTO THE LUNGS 2 (TWO) TIMES DAILY. BEGIN USING THIS INHALER ONCE YOU HAVE COMPLETED THE ORAL PREDNISONE DOSING SCHE 10.2 g 0  . triamterene-hydrochlorothiazide (MAXZIDE-25) 37.5-25 MG tablet Take 1 tablet by mouth every morning. 30 tablet 5   No current facility-administered medications on file prior to visit.     BP 128/72   Pulse 88   Temp 98.1 F (36.7 C) (Oral)   Resp 16   Ht 5\' 6"  (1.676 m)   Wt (!) 326 lb 6.4 oz (148.1 kg)   SpO2 97%   BMI 52.68 kg/m       Objective:   Physical Exam  General  Mental Status - Alert. General Appearance - Well groomed. Not in acute distress.  Skin Rashes- No Rashes.  HEENT Head- Normal. Ear Auditory Canal - Left- Normal. Right - Normal.Tympanic Membrane- Left- Normal. Right- Normal. Eye Sclera/Conjunctiva- Left- Normal. Right- Normal. Nose & Sinuses Nasal Mucosa- Left-  Boggy and Congested. Right-  Boggy and  Congested.Bilateral maxillary and frontal sinus pressure. Mouth & Throat Lips: Upper Lip- Normal: no dryness, cracking, pallor, cyanosis, or vesicular eruption. Lower Lip-Normal: no dryness, cracking, pallor, cyanosis or vesicular eruption. Buccal Mucosa- Bilateral- No Aphthous ulcers. Oropharynx- No Discharge or Erythema. Tonsils: Characteristics- Bilateral- No Erythema or Congestion. Size/Enlargement- Bilateral- No enlargement. Discharge- bilateral-None.  Neck Neck- Supple. No Masses.   Chest and Lung Exam Auscultation: Breath Sounds:-Clear even and unlabored.  Cardiovascular Auscultation:Rythm- Regular, rate and rhythm. Murmurs & Other Heart Sounds:Ausculatation of the heart reveal- No Murmurs.  Lymphatic Head & Neck General Head & Neck Lymphatics: Bilateral: Description- No Localized lymphadenopathy.         Assessment & Plan:    You appear to have bronchitis and sinusitis(with st but rapid strep negative. Rest hydrate and tylenol for fever. I am prescribing cough medicine benzonatate, and doxycycline  antibiotic. For your nasal congestion flonase.  For recent minimal wheezing, please restart your Symbicort daily.  Use albuterol if needed.  If wheezing worsens then might need to add tapered dose prednisone.  Please get labs/CBC and A1c today.  Also get chest x-ray.  Follow up in 7-10 days or as needed  Whole FoodsEdward Korrine Sicard, VF CorporationPA-C

## 2018-03-22 ENCOUNTER — Other Ambulatory Visit (HOSPITAL_COMMUNITY)
Admission: RE | Admit: 2018-03-22 | Discharge: 2018-03-22 | Disposition: A | Discharge status: Home / Self Care | Payer: 59 | Source: Ambulatory Visit | Attending: Family Medicine | Admitting: Family Medicine

## 2018-03-22 ENCOUNTER — Telehealth: Payer: Self-pay | Admitting: Family Medicine

## 2018-03-22 ENCOUNTER — Ambulatory Visit (INDEPENDENT_AMBULATORY_CARE_PROVIDER_SITE_OTHER): Payer: 59 | Admitting: Family Medicine

## 2018-03-22 ENCOUNTER — Encounter: Payer: Self-pay | Admitting: Family Medicine

## 2018-03-22 VITALS — BP 139/57 | HR 91 | Temp 98.2°F | Resp 18 | Ht 64.5 in | Wt 325.4 lb

## 2018-03-22 DIAGNOSIS — Z1239 Encounter for other screening for malignant neoplasm of breast: Secondary | ICD-10-CM

## 2018-03-22 DIAGNOSIS — Z0000 Encounter for general adult medical examination without abnormal findings: Secondary | ICD-10-CM | POA: Diagnosis not present

## 2018-03-22 DIAGNOSIS — Z1231 Encounter for screening mammogram for malignant neoplasm of breast: Secondary | ICD-10-CM | POA: Diagnosis not present

## 2018-03-22 DIAGNOSIS — N761 Subacute and chronic vaginitis: Secondary | ICD-10-CM

## 2018-03-22 DIAGNOSIS — J45909 Unspecified asthma, uncomplicated: Secondary | ICD-10-CM

## 2018-03-22 DIAGNOSIS — I10 Essential (primary) hypertension: Secondary | ICD-10-CM

## 2018-03-22 DIAGNOSIS — E669 Obesity, unspecified: Secondary | ICD-10-CM

## 2018-03-22 MED ORDER — FLUCONAZOLE 150 MG PO TABS: 150.0000 mg | ORAL_TABLET | Freq: Once | ORAL | 1 refills | Status: AC

## 2018-03-22 MED FILL — FLUCONAZOLE 150 MG TABS: 150 | 1 days supply | Qty: 1 | Fill #0

## 2018-03-22 NOTE — Progress Notes (Signed)
Subjective:  I acted as a Neurosurgeonscribe for Textron IncDr.Quinlin Conant. Fuller SongShaneka, RMA   Patient ID: Pamela GratesPenny M Solis, female    DOB: January 23, 1961, 57 y.o.   MRN: [id number]  Chief Complaint  Patient presents with  . Annual Exam    HPI  Patient is in today for annual exam and follow up on chronic medical concerns including hyperlipidemia and hypertension. No recent febrile illness or hospitalizations. She is trying to maintain a heart healthy and stay active. No recent polyuria or polydipsia. Denies CP/palp/SOB/HA/congestion/fevers/GI or GU c/o. Taking meds as prescribed. Manages activites of daily living.   Patient Care Team: Bradd CanaryBlyth, Brace Welte A, MD as PCP - General (Family Medicine)   Past Medical History:  Diagnosis Date  . Acute bronchitis 01/21/2016  . Allergic state 01/31/2017  . Allergy   . Anemia   . Asthma   . Cervical cancer screening 06/25/2015  . Chronic kidney disease   . Depression   . Edema 11/24/2014  . Hypertension   . Obesity   . Preventative health care 07/31/2016  . Snoring 11/24/2014  . Thoracic outlet syndrome 11/24/2014    Past Surgical History:  Procedure Laterality Date  . CESAREAN SECTION    . ENDOMETRIAL ABLATION  2011  . TUBAL LIGATION      Family History  Problem Relation Age of Onset  . Obesity Mother   . Arthritis Mother        rheumatoid  . Stroke Father     Social History   Socioeconomic History  . Marital status: Married    Spouse name: Not on file  . Number of children: Not on file  . Years of education: Not on file  . Highest education level: Not on file  Occupational History  . Not on file  Social Needs  . Financial resource strain: Not on file  . Food insecurity:    Worry: Not on file    Inability: Not on file  . Transportation needs:    Medical: Not on file    Non-medical: Not on file  Tobacco Use  . Smoking status: Never Smoker  . Smokeless tobacco: Never Used  Substance and Sexual Activity  . Alcohol use: No  . Drug use: No  . Sexual activity:  Not on file    Comment: lives with husband and daughters, works for [employer] no dietary restrictions  Lifestyle  . Physical activity:    Days per week: Not on file    Minutes per session: Not on file  . Stress: Not on file  Relationships  . Social connections:    Talks on phone: Not on file    Gets together: Not on file    Attends religious service: Not on file    Active member of club or organization: Not on file    Attends meetings of clubs or organizations: Not on file    Relationship status: Not on file  . Intimate partner violence:    Fear of current or ex partner: Not on file    Emotionally abused: Not on file    Physically abused: Not on file    Forced sexual activity: Not on file  Other Topics Concern  . Not on file  Social History Narrative  . Not on file    Outpatient Medications Prior to Visit  Medication Sig Dispense Refill  . albuterol (PROVENTIL HFA;VENTOLIN HFA) 108 (90 Base) MCG/ACT inhaler Inhale 2 puffs into the lungs every 6 (six) hours as needed. For wheezing 1 Inhaler 1  .  ALL DAY ALLERGY 10 MG tablet TAKE 1 TABLET (10 MG TOTAL) BY MOUTH DAILY. 100 tablet 3  . escitalopram (LEXAPRO) 20 MG tablet Take 1 tablet (20 mg total) by mouth daily. 30 tablet 5  . fluticasone (FLONASE) 50 MCG/ACT nasal spray Place 2 sprays into both nostrils daily. 16 g 1  . ibuprofen (ADVIL,MOTRIN) 800 MG tablet Take 1 tablet (800 mg total) by mouth every 8 (eight) hours as needed. For migraine 270 tablet 0  . montelukast (SINGULAIR) 10 MG tablet TAKE 1 TABLET (10 MG TOTAL) BY MOUTH DAILY. 30 tablet 2  . SYMBICORT 80-4.5 MCG/ACT inhaler INHALE 2 PUFFS BY MOUTH INTO THE LUNGS 2 (TWO) TIMES DAILY. BEGIN USING THIS INHALER ONCE YOU HAVE COMPLETED THE ORAL PREDNISONE DOSING SCHE 10.2 g 0  . triamterene-hydrochlorothiazide (MAXZIDE-25) 37.5-25 MG tablet Take 1 tablet by mouth every morning. 30 tablet 5  . benzonatate (TESSALON) 100 MG capsule Take 1 capsule (100 mg total) by mouth 3 (three)  times daily as needed for cough. 30 capsule 0  . doxycycline (VIBRA-TABS) 100 MG tablet Take 1 tablet (100 mg total) by mouth 2 (two) times daily. Can give caps or generic 20 tablet 0   No facility-administered medications prior to visit.     Allergies  Allergen Reactions  . Penicillins Rash    Review of Systems  Constitutional: Negative for fever and malaise/fatigue.  HENT: Negative for congestion.   Eyes: Negative for blurred vision.  Respiratory: Negative for shortness of breath.   Cardiovascular: Negative for chest pain, palpitations and leg swelling.  Gastrointestinal: Negative for abdominal pain, blood in stool and nausea.  Genitourinary: Negative for dysuria and frequency.  Musculoskeletal: Negative for falls.  Skin: Negative for rash.  Neurological: Negative for dizziness, loss of consciousness and headaches.  Endo/Heme/Allergies: Negative for environmental allergies.  Psychiatric/Behavioral: Negative for depression. The patient is not nervous/anxious.        Objective:    Physical Exam  Constitutional: She is oriented to person, place, and time. She appears well-developed and well-nourished. No distress.  HENT:  Head: Normocephalic and atraumatic.  Nose: Nose normal.  Eyes: Right eye exhibits no discharge. Left eye exhibits no discharge.  Neck: Normal range of motion. Neck supple.  Cardiovascular: Normal rate and regular rhythm.  No murmur heard. Pulmonary/Chest: Effort normal and breath sounds normal.  Abdominal: Soft. Bowel sounds are normal. There is no tenderness.  Musculoskeletal: She exhibits no edema.  Neurological: She is alert and oriented to person, place, and time.  Skin: Skin is warm and dry.  Psychiatric: She has a normal mood and affect.  Nursing note and vitals reviewed.   BP (!) 139/57 (BP Location: Left Arm, Patient Position: Sitting, Cuff Size: Large)   Pulse 91   Temp 98.2 F (36.8 C) (Oral)   Resp 18   Ht 5' 4.5" (1.638 m)   Wt (!) 325  lb 6.4 oz (147.6 kg)   SpO2 96%   BMI 54.99 kg/m  Wt Readings from Last 3 Encounters:  03/22/18 (!) 325 lb 6.4 oz (147.6 kg)  03/09/18 (!) 326 lb 6.4 oz (148.1 kg)  01/31/17 (!) 326 lb 9.6 oz (148.1 kg)   BP Readings from Last 3 Encounters:  03/22/18 (!) 139/57  03/09/18 128/72  01/31/17 132/82     Immunization History  Administered Date(s) Administered  . Influenza-Unspecified 06/19/2015  . Tdap 07/26/2016    Health Maintenance  Topic Date Due  . HIV Screening  06/15/1978  . COLONOSCOPY  06/15/2013  .  MAMMOGRAM  07/06/2017  . INFLUENZA VACCINE  04/26/2018  . PAP SMEAR  06/24/2018  . TETANUS/TDAP  07/26/2026  . Hepatitis C Screening  Completed    Lab Results  Component Value Date   WBC 12.4 (H) 03/09/2018   HGB 14.2 03/09/2018   HCT 43.0 03/09/2018   PLT 319.0 03/09/2018   GLUCOSE 91 07/24/2017   CHOL 160 07/24/2017   TRIG 108.0 07/24/2017   HDL 50.40 07/24/2017   LDLCALC 88 07/24/2017   ALT 35 07/24/2017   AST 38 (H) 07/24/2017   NA 136 07/24/2017   K 3.8 07/24/2017   CL 100 07/24/2017   CREATININE 0.68 07/24/2017   BUN 12 07/24/2017   CO2 28 07/24/2017   TSH 2.37 07/24/2017   HGBA1C 5.7 03/09/2018    Lab Results  Component Value Date   TSH 2.37 07/24/2017   Lab Results  Component Value Date   WBC 12.4 (H) 03/09/2018   HGB 14.2 03/09/2018   HCT 43.0 03/09/2018   MCV 90.1 03/09/2018   PLT 319.0 03/09/2018   Lab Results  Component Value Date   NA 136 07/24/2017   K 3.8 07/24/2017   CO2 28 07/24/2017   GLUCOSE 91 07/24/2017   BUN 12 07/24/2017   CREATININE 0.68 07/24/2017   BILITOT 0.6 07/24/2017   ALKPHOS 92 07/24/2017   AST 38 (H) 07/24/2017   ALT 35 07/24/2017   PROT 7.4 07/24/2017   ALBUMIN 3.7 07/24/2017   CALCIUM 9.2 07/24/2017   GFR 95.80 07/24/2017   Lab Results  Component Value Date   CHOL 160 07/24/2017   Lab Results  Component Value Date   HDL 50.40 07/24/2017   Lab Results  Component Value Date   LDLCALC 88  07/24/2017   Lab Results  Component Value Date   TRIG 108.0 07/24/2017   Lab Results  Component Value Date   CHOLHDL 3 07/24/2017   Lab Results  Component Value Date   HGBA1C 5.7 03/09/2018         Assessment & Plan:   Problem List Items Addressed This Visit    Asthma    Adequately controlled on symbicort      HTN (hypertension)    Well controlled, no changes to meds. Encouraged heart healthy diet such as the DASH diet and exercise as tolerated.       Obesity    Encouraged DASH diet, decrease po intake and increase exercise as tolerated. Needs 7-8 hours of sleep nightly. Avoid trans fats, eat small, frequent meals every 4-5 hours with lean proteins, complex carbs and healthy fats. Minimize simple carbs, consider bariatric referral      Preventative health care    Patient encouraged to maintain heart healthy diet, regular exercise, adequate sleep. Consider daily probiotics. Take medications as prescribed. Given and reviewed copy of ACP documents from [facility] Secretary of State and encouraged to complete and return      Subacute vaginitis    Had recent antibiotic use. Treated with diflucan and ancillary testing run. Start a probiotic.       Relevant Orders   Urine cytology ancillary only    Other Visit Diagnoses    Breast cancer screening    -  Primary   Relevant Orders   MM 3D SCREEN BREAST BILATERAL      I have discontinued Nicholes Rough. Dominey's doxycycline and benzonatate. I am also having her start on fluconazole. Additionally, I am having her maintain her ibuprofen, albuterol, triamterene-hydrochlorothiazide, escitalopram, ALL DAY ALLERGY, montelukast,  SYMBICORT, and fluticasone.  Meds ordered this encounter  Medications  . fluconazole (DIFLUCAN) 150 MG tablet    Sig: Take 1 tablet (150 mg total) by mouth once for 1 dose.    Dispense:  1 tablet    Refill:  1    CMA served as scribe during this visit. History, Physical and Plan performed by medical provider.  Documentation and orders reviewed and attested to.  [name], MD

## 2018-03-22 NOTE — Assessment & Plan Note (Signed)
Well controlled, no changes to meds. Encouraged heart healthy diet such as the DASH diet and exercise as tolerated.  °

## 2018-03-22 NOTE — Patient Instructions (Addendum)
NOW probiotic or [employer]'s Colon Health or Culturelle Consider warm tea with lemon and honey or slippery elm tea for the cough Shingrix is the new shingles shot, 2 shots over 2-6 months. At pharmacy or at the office Preventive Care 40-64 Years, Female Preventive care refers to lifestyle choices and visits with your health care provider that can promote health and wellness. What does preventive care include?  A yearly physical exam. This is also called an annual well check.  Dental exams once or twice a year.  Routine eye exams. Ask your health care provider how often you should have your eyes checked.  Personal lifestyle choices, including: ? Daily care of your teeth and gums. ? Regular physical activity. ? Eating a healthy diet. ? Avoiding tobacco and drug use. ? Limiting alcohol use. ? Practicing safe sex. ? Taking low-dose aspirin daily starting at age 55. ? Taking vitamin and mineral supplements as recommended by your health care provider. What happens during an annual well check? The services and screenings done by your health care provider during your annual well check will depend on your age, overall health, lifestyle risk factors, and family history of disease. Counseling Your health care provider may ask you questions about your:  Alcohol use.  Tobacco use.  Drug use.  Emotional well-being.  Home and relationship well-being.  Sexual activity.  Eating habits.  Work and work Statistician.  Method of birth control.  Menstrual cycle.  Pregnancy history.  Screening You may have the following tests or measurements:  Height, weight, and BMI.  Blood pressure.  Lipid and cholesterol levels. These may be checked every 5 years, or more frequently if you are over 55 years old.  Skin check.  Lung cancer screening. You may have this screening every year starting at age 55 if you have a 30-pack-year history of smoking and currently smoke or have quit within the  past 15 years.  Fecal occult blood test (FOBT) of the stool. You may have this test every year starting at age 55.  Flexible sigmoidoscopy or colonoscopy. You may have a sigmoidoscopy every 5 years or a colonoscopy every 10 years starting at age 55.  Hepatitis C blood test.  Hepatitis B blood test.  Sexually transmitted disease (STD) testing.  Diabetes screening. This is done by checking your blood sugar (glucose) after you have not eaten for a while (fasting). You may have this done every 1-3 years.  Mammogram. This may be done every 1-2 years. Talk to your health care provider about when you should start having regular mammograms. This may depend on whether you have a family history of breast cancer.  BRCA-related cancer screening. This may be done if you have a family history of breast, ovarian, tubal, or peritoneal cancers.  Pelvic exam and Pap test. This may be done every 3 years starting at age 55. Starting at age 50, this may be done every 5 years if you have a Pap test in combination with an HPV test.  Bone density scan. This is done to screen for osteoporosis. You may have this scan if you are at high risk for osteoporosis.  Discuss your test results, treatment options, and if necessary, the need for more tests with your health care provider. Vaccines Your health care provider may recommend certain vaccines, such as:  Influenza vaccine. This is recommended every year.  Tetanus, diphtheria, and acellular pertussis (Tdap, Td) vaccine. You may need a Td booster every 10 years.  Varicella vaccine. You may  may need this if you have not been vaccinated.  Zoster vaccine. You may need this after age 55.  Measles, mumps, and rubella (MMR) vaccine. You may need at least one dose of MMR if you were born in 1957 or later. You may also need a second dose.  Pneumococcal 13-valent conjugate (PCV13) vaccine. You may need this if you have certain conditions and were not previously  vaccinated.  Pneumococcal polysaccharide (PPSV23) vaccine. You may need one or two doses if you smoke cigarettes or if you have certain conditions.  Meningococcal vaccine. You may need this if you have certain conditions.  Hepatitis A vaccine. You may need this if you have certain conditions or if you travel or work in places where you may be exposed to hepatitis A.  Hepatitis B vaccine. You may need this if you have certain conditions or if you travel or work in places where you may be exposed to hepatitis B.  Haemophilus influenzae type b (Hib) vaccine. You may need this if you have certain conditions.  Talk to your health care provider about which screenings and vaccines you need and how often you need them. This information is not intended to replace advice given to you by your health care provider. Make sure you discuss any questions you have with your health care provider. Document Released: 10/09/2015 Document Revised: 06/01/2016 Document Reviewed: 07/14/2015 Elsevier Interactive Patient Education  2018 Elsevier Inc.  

## 2018-03-22 NOTE — Telephone Encounter (Signed)
Copied from CRM [id number]. Topic: Quick Communication - See Telephone Encounter >> Mar 22, 2018  2:52 PM Valentina LucksMatos, Jackelin wrote: CRM for notification. See Telephone encounter for: 03/22/18.   Pt came in office and was seen by provider, pt states had to have a next appt in December but wanted to know if she needed to have labs done a wk before her appt in December, if so to please put in lab orders and inform pt that labs orders are in so pt can schedule an appt. Please advise.

## 2018-03-22 NOTE — Assessment & Plan Note (Signed)
Encouraged DASH diet, decrease po intake and increase exercise as tolerated. Needs 7-8 hours of sleep nightly. Avoid trans fats, eat small, frequent meals every 4-5 hours with lean proteins, complex carbs and healthy fats. Minimize simple carbs, consider bariatric referral 

## 2018-03-25 DIAGNOSIS — N761 Subacute and chronic vaginitis: Secondary | ICD-10-CM | POA: Insufficient documentation

## 2018-03-25 NOTE — Assessment & Plan Note (Signed)
Patient encouraged to maintain heart healthy diet, regular exercise, adequate sleep. Consider daily probiotics. Take medications as prescribed. Given and reviewed copy of ACP documents from [place] Secretary of State and encouraged to complete and return 

## 2018-03-25 NOTE — Assessment & Plan Note (Signed)
Had recent antibiotic use. Treated with diflucan and ancillary testing run. Start a probiotic.

## 2018-03-25 NOTE — Assessment & Plan Note (Signed)
Adequately controlled on symbicort

## 2018-03-26 LAB — URINE CYTOLOGY ANCILLARY ONLY
Bacterial vaginitis: NEGATIVE
Candida vaginitis: NEGATIVE

## 2018-03-26 NOTE — Telephone Encounter (Signed)
Author phoned pt. To address pt's concern about labwork prior to her OV for GYN exam on 12/27. Author advised to check with insurance company regarding coverage of labwork prior to OV, and pt. Stated she would just wait until the visit to complete any labs Dr. Abner GreenspanBlyth may order.

## 2018-03-30 ENCOUNTER — Encounter: Payer: Self-pay | Admitting: Medical

## 2018-03-30 ENCOUNTER — Ambulatory Visit: Payer: 59 | Admitting: Medical

## 2018-03-30 VITALS — BP 122/68 | HR 89 | Temp 98.1°F | Resp 16 | Ht 66.0 in | Wt 325.0 lb

## 2018-03-30 DIAGNOSIS — J40 Bronchitis, not specified as acute or chronic: Secondary | ICD-10-CM | POA: Diagnosis not present

## 2018-03-30 MED ORDER — AZITHROMYCIN 250 MG PO TABS: ORAL_TABLET | ORAL | 0 refills | Status: DC

## 2018-03-30 MED ORDER — BENZONATATE 100 MG PO CAPS: 100.0000 mg | ORAL_CAPSULE | Freq: Three times a day (TID) | ORAL | 0 refills | Status: DC | PRN

## 2018-03-30 MED FILL — BENZONATATE 100 MG CAPSULE: 100 | 10 days supply | Qty: 30 | Fill #0

## 2018-03-30 MED FILL — AZITHROMYCIN 250 MG TABLET: 250 | 5 days supply | Qty: 6 | Fill #0

## 2018-03-30 NOTE — Patient Instructions (Addendum)
You appear to have bronchitis. Rest hydrate and tylenol for fever. I am prescribing cough medicine benzonatate and antibiotic azithcomycin. For your nasal congestion rx flonase.  You should gradually get better. If not then notify us and would recommend a chest xray.    Follow up in 7-10 days or as needed

## 2018-03-30 NOTE — Progress Notes (Signed)
Subjective:    Patient ID: Pamela Solis, female    DOB: [birth year], 57 y.o.   MRN: [id number]  HPI   Pt in states she seemed to get better since I last saw her but then last week got sick again after about 10 days of feeling well.  She states has moderate to severe cough. Bringing up mucus with some chest congestion. Nasal congested and hoarse voice. She feels like she can work. No direct contact with patients.    Pt is not wheezing and no shortness of breath.   Last time I saw her she did well with benonzatate and doxycycline.    Review of Systems  Constitutional: Negative for chills, fatigue and fever.  HENT: Positive for congestion and postnasal drip. Negative for sore throat.   Respiratory: Positive for cough. Negative for chest tightness, shortness of breath and wheezing.        Chest congestion.  Cardiovascular: Negative for chest pain and palpitations.  Gastrointestinal: Negative for abdominal pain.  Neurological: Negative for dizziness and headaches.  Hematological: Negative for adenopathy. Does not bruise/bleed easily.    Past Medical History:  Diagnosis Date  . Acute bronchitis 01/21/2016  . Allergic state 01/31/2017  . Allergy   . Anemia   . Asthma   . Cervical cancer screening 06/25/2015  . Chronic kidney disease   . Depression   . Edema 11/24/2014  . Hypertension   . Obesity   . Preventative health care 07/31/2016  . Snoring 11/24/2014  . Thoracic outlet syndrome 11/24/2014     Social History   Socioeconomic History  . Marital status: Married    Spouse name: Not on file  . Number of children: Not on file  . Years of education: Not on file  . Highest education level: Not on file  Occupational History  . Not on file  Social Needs  . Financial resource strain: Not on file  . Food insecurity:    Worry: Not on file    Inability: Not on file  . Transportation needs:    Medical: Not on file    Non-medical: Not on file  Tobacco Use  . Smoking status:  Never Smoker  . Smokeless tobacco: Never Used  Substance and Sexual Activity  . Alcohol use: No  . Drug use: No  . Sexual activity: Not on file    Comment: lives with husband and daughters, works for [employer] no dietary restrictions  Lifestyle  . Physical activity:    Days per week: Not on file    Minutes per session: Not on file  . Stress: Not on file  Relationships  . Social connections:    Talks on phone: Not on file    Gets together: Not on file    Attends religious service: Not on file    Active member of club or organization: Not on file    Attends meetings of clubs or organizations: Not on file    Relationship status: Not on file  . Intimate partner violence:    Fear of current or ex partner: Not on file    Emotionally abused: Not on file    Physically abused: Not on file    Forced sexual activity: Not on file  Other Topics Concern  . Not on file  Social History Narrative  . Not on file    Past Surgical History:  Procedure Laterality Date  . CESAREAN SECTION    . ENDOMETRIAL ABLATION  2011  . TUBAL LIGATION  Family History  Problem Relation Age of Onset  . Obesity Mother   . Arthritis Mother        rheumatoid  . Stroke Father     Allergies  Allergen Reactions  . Penicillins Rash    Current Outpatient Medications on File Prior to Visit  Medication Sig Dispense Refill  . albuterol (PROVENTIL HFA;VENTOLIN HFA) 108 (90 Base) MCG/ACT inhaler Inhale 2 puffs into the lungs every 6 (six) hours as needed. For wheezing 1 Inhaler 1  . ALL DAY ALLERGY 10 MG tablet TAKE 1 TABLET (10 MG TOTAL) BY MOUTH DAILY. 100 tablet 3  . escitalopram (LEXAPRO) 20 MG tablet Take 1 tablet (20 mg total) by mouth daily. 30 tablet 5  . fluticasone (FLONASE) 50 MCG/ACT nasal spray Place 2 sprays into both nostrils daily. 16 g 1  . ibuprofen (ADVIL,MOTRIN) 800 MG tablet Take 1 tablet (800 mg total) by mouth every 8 (eight) hours as needed. For migraine 270 tablet 0  . montelukast  (SINGULAIR) 10 MG tablet TAKE 1 TABLET (10 MG TOTAL) BY MOUTH DAILY. 30 tablet 2  . SYMBICORT 80-4.5 MCG/ACT inhaler INHALE 2 PUFFS BY MOUTH INTO THE LUNGS 2 (TWO) TIMES DAILY. BEGIN USING THIS INHALER ONCE YOU HAVE COMPLETED THE ORAL PREDNISONE DOSING SCHE 10.2 g 0  . triamterene-hydrochlorothiazide (MAXZIDE-25) 37.5-25 MG tablet Take 1 tablet by mouth every morning. 30 tablet 5   No current facility-administered medications on file prior to visit.     BP 122/68   Pulse 89   Temp 98.1 F (36.7 C) (Oral)   Resp 16   Ht 5\' 6"  (1.676 m)   Wt (!) 325 lb (147.4 kg)   SpO2 93%   BMI 52.46 kg/m       Objective:   Physical Exam  General  Mental Status - Alert. General Appearance - Well groomed. Not in acute distress. Nasal congestion. Skin Rashes- No Rashes.  HEENT Head- Normal. Ear Auditory Canal - Left- Normal. Right - Normal.Tympanic Membrane- Left- Normal. Right- Normal. Eye Sclera/Conjunctiva- Left- Normal. Right- Normal. Nose & Sinuses Nasal Mucosa- Left-  Boggy and Congested. Right-  Boggy and  Congested.Bilateral  No maxillary and no frontal sinus pressure. Mouth & Throat Lips: Upper Lip- Normal: no dryness, cracking, pallor, cyanosis, or vesicular eruption. Lower Lip-Normal: no dryness, cracking, pallor, cyanosis or vesicular eruption. Buccal Mucosa- Bilateral- No Aphthous ulcers. Oropharynx- No Discharge or Erythema. Tonsils: Characteristics- Bilateral- No Erythema or Congestion. Size/Enlargement- Bilateral- No enlargement. Discharge- bilateral-None.  Neck Neck- Supple. No Masses.   Chest and Lung Exam Auscultation: Breath Sounds:-Clear even and unlabored.  Cardiovascular Auscultation:Rythm- Regular, rate and rhythm. Murmurs & Other Heart Sounds:Ausculatation of the heart reveal- No Murmurs.  Lymphatic Head & Neck General Head & Neck Lymphatics: Bilateral: Description- No Localized lymphadenopathy.        Assessment & Plan:  You appear to have  bronchitis. Rest hydrate and tylenol for fever. I am prescribing cough medicine benzonatate and antibiotic azithcomycin. For your nasal congestion rx flonase.  You should gradually get better. If not then notify us and would recommend a chest xray.  Follow up in 7-10 days or as needed  Whole FoodsEdward Taleia Sadowski, VF CorporationPA-C

## 2018-04-20 MED FILL — MONTELUKAST SOD 10 MG TAB: 10 | 30 days supply | Qty: 30 | Fill #1

## 2018-05-07 ENCOUNTER — Ambulatory Visit: Payer: 59 | Admitting: Family Medicine

## 2018-05-07 VITALS — BP 128/82 | HR 106 | Temp 98.3°F | Resp 18 | Wt 321.4 lb

## 2018-05-07 DIAGNOSIS — I10 Essential (primary) hypertension: Secondary | ICD-10-CM

## 2018-05-07 DIAGNOSIS — N760 Acute vaginitis: Secondary | ICD-10-CM | POA: Diagnosis not present

## 2018-05-07 DIAGNOSIS — N764 Abscess of vulva: Secondary | ICD-10-CM | POA: Insufficient documentation

## 2018-05-07 MED ORDER — ESCITALOPRAM OXALATE 20 MG PO TABS
20.0000 mg | ORAL_TABLET | Freq: Every day | ORAL | 5 refills | Status: DC
Start: 2018-05-07 — End: 2019-05-31

## 2018-05-07 MED ORDER — SULFAMETHOXAZOLE-TRIMETHOPRIM 800-160 MG PO TABS: 1.0000 | ORAL_TABLET | Freq: Two times a day (BID) | ORAL | 0 refills | Status: DC

## 2018-05-07 MED FILL — ESCITALOPRAM 20 MG TABLET: 20 | 30 days supply | Qty: 30 | Fill #0

## 2018-05-07 MED FILL — SULFAMETHOXAZOLE-TMP DS TAB: 800-160 | 10 days supply | Qty: 20 | Fill #0

## 2018-05-07 NOTE — Progress Notes (Signed)
Subjective:    Patient ID: Pamela Solis, female    DOB: [birth year], 57 y.o.   MRN: [id number]  No chief complaint on file.   HPI Patient is in today for evaluation of an abscess on right labia. It has been growing in size for oughly 5 days. Is very large and painful. She was unable to go to work today. Is bleeding some. No fevers, chills, malaise or myalgias. She has never had a similar lesion. Denies CP/palp/SOB/HA/congestion/fevers/GI or GU c/o. Taking meds as prescribed  Past Medical History:  Diagnosis Date  . Acute bronchitis 01/21/2016  . Allergic state 01/31/2017  . Allergy   . Anemia   . Asthma   . Cervical cancer screening 06/25/2015  . Chronic kidney disease   . Depression   . Edema 11/24/2014  . Hypertension   . Obesity   . Preventative health care 07/31/2016  . Snoring 11/24/2014  . Thoracic outlet syndrome 11/24/2014    Past Surgical History:  Procedure Laterality Date  . CESAREAN SECTION    . ENDOMETRIAL ABLATION  2011  . TUBAL LIGATION      Family History  Problem Relation Age of Onset  . Obesity Mother   . Arthritis Mother        rheumatoid  . Stroke Father     Social History   Socioeconomic History  . Marital status: Married    Spouse name: Not on file  . Number of children: Not on file  . Years of education: Not on file  . Highest education level: Not on file  Occupational History  . Not on file  Social Needs  . Financial resource strain: Not on file  . Food insecurity:    Worry: Not on file    Inability: Not on file  . Transportation needs:    Medical: Not on file    Non-medical: Not on file  Tobacco Use  . Smoking status: Never Smoker  . Smokeless tobacco: Never Used  Substance and Sexual Activity  . Alcohol use: No  . Drug use: No  . Sexual activity: Not on file    Comment: lives with husband and daughters, works for [employer] no dietary restrictions  Lifestyle  . Physical activity:    Days per week: Not on file    Minutes per  session: Not on file  . Stress: Not on file  Relationships  . Social connections:    Talks on phone: Not on file    Gets together: Not on file    Attends religious service: Not on file    Active member of club or organization: Not on file    Attends meetings of clubs or organizations: Not on file    Relationship status: Not on file  . Intimate partner violence:    Fear of current or ex partner: Not on file    Emotionally abused: Not on file    Physically abused: Not on file    Forced sexual activity: Not on file  Other Topics Concern  . Not on file  Social History Narrative  . Not on file    Outpatient Medications Prior to Visit  Medication Sig Dispense Refill  . albuterol (PROVENTIL HFA;VENTOLIN HFA) 108 (90 Base) MCG/ACT inhaler Inhale 2 puffs into the lungs every 6 (six) hours as needed. For wheezing 1 Inhaler 1  . ALL DAY ALLERGY 10 MG tablet TAKE 1 TABLET (10 MG TOTAL) BY MOUTH DAILY. 100 tablet 3  . fluticasone (FLONASE) 50 MCG/ACT  nasal spray Place 2 sprays into both nostrils daily. 16 g 1  . ibuprofen (ADVIL,MOTRIN) 800 MG tablet Take 1 tablet (800 mg total) by mouth every 8 (eight) hours as needed. For migraine 270 tablet 0  . montelukast (SINGULAIR) 10 MG tablet TAKE 1 TABLET (10 MG TOTAL) BY MOUTH DAILY. 30 tablet 2  . SYMBICORT 80-4.5 MCG/ACT inhaler INHALE 2 PUFFS BY MOUTH INTO THE LUNGS 2 (TWO) TIMES DAILY. BEGIN USING THIS INHALER ONCE YOU HAVE COMPLETED THE ORAL PREDNISONE DOSING SCHE 10.2 g 0  . triamterene-hydrochlorothiazide (MAXZIDE-25) 37.5-25 MG tablet Take 1 tablet by mouth every morning. 30 tablet 5  . azithromycin (ZITHROMAX) 250 MG tablet Take 2 tablets by mouth on day 1, followed by 1 tablet by mouth daily for 4 days. 6 tablet 0  . benzonatate (TESSALON) 100 MG capsule Take 1 capsule (100 mg total) by mouth 3 (three) times daily as needed for cough. 30 capsule 0  . escitalopram (LEXAPRO) 20 MG tablet Take 1 tablet (20 mg total) by mouth daily. 30 tablet 5    No facility-administered medications prior to visit.     Allergies  Allergen Reactions  . Penicillins Rash    Review of Systems  Constitutional: Negative for fever and malaise/fatigue.  HENT: Negative for congestion.   Eyes: Negative for blurred vision.  Respiratory: Negative for shortness of breath.   Cardiovascular: Negative for chest pain, palpitations and leg swelling.  Gastrointestinal: Negative for abdominal pain, blood in stool and nausea.  Genitourinary: Negative for dysuria and frequency.  Musculoskeletal: Negative for falls.  Skin: Negative for rash.  Neurological: Negative for dizziness, loss of consciousness and headaches.  Endo/Heme/Allergies: Negative for environmental allergies.  Psychiatric/Behavioral: Negative for depression. The patient is not nervous/anxious.        Objective:    Physical Exam  Constitutional: She is oriented to person, place, and time. She appears well-developed and well-nourished. No distress.  HENT:  Head: Normocephalic and atraumatic.  Nose: Nose normal.  Eyes: Right eye exhibits no discharge. Left eye exhibits no discharge.  Neck: Normal range of motion. Neck supple.  Cardiovascular: Normal rate and regular rhythm.  No murmur heard. Pulmonary/Chest: Effort normal and breath sounds normal.  Abdominal: Soft. Bowel sounds are normal. There is no tenderness.  Genitourinary:  Genitourinary Comments: 3 to 4 cm abscess right labia. 2 separate openings with pus noted. Cleaned with peroxide and then debrided and pus removed. Area bleeds some once clean.  Musculoskeletal: She exhibits no edema.  Neurological: She is alert and oriented to person, place, and time.  Skin: Skin is warm and dry.  Psychiatric: She has a normal mood and affect.  Nursing note and vitals reviewed.   BP 128/82 (BP Location: Left Arm, Patient Position: Sitting, Cuff Size: Normal)   Pulse (!) 106   Temp 98.3 F (36.8 C) (Oral)   Resp 18   Wt (!) 321 lb 6.4 oz  (145.8 kg)   SpO2 93%   BMI 51.88 kg/m  Wt Readings from Last 3 Encounters:  05/07/18 (!) 321 lb 6.4 oz (145.8 kg)  03/30/18 (!) 325 lb (147.4 kg)  03/22/18 (!) 325 lb 6.4 oz (147.6 kg)     Lab Results  Component Value Date   WBC 12.4 (H) 03/09/2018   HGB 14.2 03/09/2018   HCT 43.0 03/09/2018   PLT 319.0 03/09/2018   GLUCOSE 91 07/24/2017   CHOL 160 07/24/2017   TRIG 108.0 07/24/2017   HDL 50.40 07/24/2017   LDLCALC 88 07/24/2017  ALT 35 07/24/2017   AST 38 (H) 07/24/2017   NA 136 07/24/2017   K 3.8 07/24/2017   CL 100 07/24/2017   CREATININE 0.68 07/24/2017   BUN 12 07/24/2017   CO2 28 07/24/2017   TSH 2.37 07/24/2017   HGBA1C 5.7 03/09/2018    Lab Results  Component Value Date   TSH 2.37 07/24/2017   Lab Results  Component Value Date   WBC 12.4 (H) 03/09/2018   HGB 14.2 03/09/2018   HCT 43.0 03/09/2018   MCV 90.1 03/09/2018   PLT 319.0 03/09/2018   Lab Results  Component Value Date   NA 136 07/24/2017   K 3.8 07/24/2017   CO2 28 07/24/2017   GLUCOSE 91 07/24/2017   BUN 12 07/24/2017   CREATININE 0.68 07/24/2017   BILITOT 0.6 07/24/2017   ALKPHOS 92 07/24/2017   AST 38 (H) 07/24/2017   ALT 35 07/24/2017   PROT 7.4 07/24/2017   ALBUMIN 3.7 07/24/2017   CALCIUM 9.2 07/24/2017   GFR 95.80 07/24/2017   Lab Results  Component Value Date   CHOL 160 07/24/2017   Lab Results  Component Value Date   HDL 50.40 07/24/2017   Lab Results  Component Value Date   LDLCALC 88 07/24/2017   Lab Results  Component Value Date   TRIG 108.0 07/24/2017   Lab Results  Component Value Date   CHOLHDL 3 07/24/2017   Lab Results  Component Value Date   HGBA1C 5.7 03/09/2018       Assessment & Plan:   Problem List Items Addressed This Visit    HTN (hypertension)    Well controlled, no changes to meds. Encouraged heart healthy diet such as the DASH diet and exercise as tolerated.       Abscess, vagina - Primary    Lesion on right labia worsening for  roughly 5 days since last Wednesday. At first it was manageable but the past couple of days the pain and swelling have intensified and there has been some mild bleeding. Area is cleaned with sterile gauze and H2O2 and then debrided slightly with a sterile blade and pick ups. Some pus excised but patient very uncomfortable. Started on Bactrim DS and patient referred to GYN for further debridement will need to stay out of work to soak in hot bath with 1 tbls bleach a couple times a day and to get this surgically debrided. Spent 40 minutes with patient in treatment and care coordination      Relevant Orders   Ambulatory referral to Obstetrics / Gynecology      I have discontinued Pamela Solis's azithromycin and benzonatate. I am also having her start on sulfamethoxazole-trimethoprim. Additionally, I am having her maintain her ibuprofen, albuterol, triamterene-hydrochlorothiazide, ALL DAY ALLERGY, montelukast, SYMBICORT, fluticasone, and escitalopram.  Meds ordered this encounter  Medications  . escitalopram (LEXAPRO) 20 MG tablet    Sig: Take 1 tablet (20 mg total) by mouth daily.    Dispense:  30 tablet    Refill:  5  . sulfamethoxazole-trimethoprim (BACTRIM DS,SEPTRA DS) 800-160 MG tablet    Sig: Take 1 tablet by mouth 2 (two) times daily.    Dispense:  20 tablet    Refill:  0     Danise EdgeStacey Keenon Leitzel, MD

## 2018-05-07 NOTE — Assessment & Plan Note (Signed)
Well controlled, no changes to meds. Encouraged heart healthy diet such as the DASH diet and exercise as tolerated.  °

## 2018-05-07 NOTE — Assessment & Plan Note (Signed)
Lesion on right labia worsening for roughly 5 days since last Wednesday. At first it was manageable but the past couple of days the pain and swelling have intensified and there has been some mild bleeding. Area is cleaned with sterile gauze and H2O2 and then debrided slightly with a sterile blade and pick ups. Some pus excised but patient very uncomfortable. Started on Bactrim DS and patient referred to GYN for further debridement will need to stay out of work to soak in hot bath with 1 tbls bleach a couple times a day and to get this surgically debrided. Spent 40 minutes with patient in treatment and care coordination

## 2018-05-07 NOTE — Patient Instructions (Signed)

## 2018-05-10 ENCOUNTER — Telehealth: Payer: Self-pay

## 2018-05-10 NOTE — Telephone Encounter (Signed)
Patient called and states that she is a referral from [facility]. She has an abcess that is open and draining and would like to see someone ASAP. Patient worked in on Monday 05-14-18 but also given number to [facility] so she can call and see their availability. Armandina StammerJennifer Lettie Czarnecki RN

## 2018-05-14 ENCOUNTER — Encounter: Payer: Self-pay | Admitting: Obstetrics & Gynecology

## 2018-05-14 ENCOUNTER — Ambulatory Visit: Payer: 59 | Admitting: Obstetrics & Gynecology

## 2018-05-14 VITALS — BP 117/64 | HR 81 | Ht 66.0 in | Wt 319.0 lb

## 2018-05-14 DIAGNOSIS — N764 Abscess of vulva: Secondary | ICD-10-CM | POA: Diagnosis not present

## 2018-05-14 MED ORDER — SULFAMETHOXAZOLE-TRIMETHOPRIM 800-160 MG PO TABS
1.0000 | ORAL_TABLET | Freq: Two times a day (BID) | ORAL | 0 refills | Status: DC
Start: 2018-05-14 — End: 2018-06-06

## 2018-05-14 NOTE — Progress Notes (Signed)
History:  57 y.o. [id number] here today for eval of vulvar abscess. This was drained on 8/12 by Dr. Rogelia RohrerBlythe who thought there may have been another pocket that needed to be drained. The pt has been on Bactrim bid since that time,. She reports that she feels much better than she did when she initially presented.     The following portions of the patient's history were reviewed and updated as appropriate: allergies, current medications, past family history, past medical history, past social history, past surgical history and problem list.  Review of Systems:  Pertinent items are noted in HPI.    Objective:  Physical Exam Blood pressure 117/64, pulse 81, height 5\' 6"  (1.676 m), weight (!) 319 lb (144.7 kg).  CONSTITUTIONAL: Well-developed, well-nourished female in no acute distress.  HENT:  Normocephalic, atraumatic EYES: Conjunctivae and EOM are normal. No scleral icterus.  NECK: Normal range of motion SKIN: Skin is warm and dry. No rash noted. Not diaphoretic.No pallor. NEUROLGIC: Alert and oriented to person, place, and time. Normal coordination.   GU: EGBUS: on the right labia majus there is an excoriation where the abscess was prev drained. There  is some induration but no abscess. NO inguinal LA    Labs and Imaging No results found.  Assessment & Plan:  Vulvar abscess- drained and resolving  Keep Bactrim to complete 14 days.   Stop H2O2  Begin ATBX ointment  F/u in 2 weeks to assess complete resolution.   F/u sooner prn  Makaila Windle L. Harraway-Smith, M.D., Evern CoreFACOG

## 2018-05-14 NOTE — Patient Instructions (Signed)

## 2018-05-15 MED FILL — SULFAMETHOXAZOLE-TMP DS TAB: 800-160 | 4 days supply | Qty: 8 | Fill #0

## 2018-05-18 ENCOUNTER — Ambulatory Visit: Payer: 59 | Admitting: Obstetrics & Gynecology

## 2018-06-06 ENCOUNTER — Encounter: Payer: Self-pay | Admitting: Family Medicine

## 2018-06-06 ENCOUNTER — Ambulatory Visit (INDEPENDENT_AMBULATORY_CARE_PROVIDER_SITE_OTHER): Payer: 59 | Admitting: Family Medicine

## 2018-06-06 VITALS — BP 136/61 | HR 73 | Ht 66.0 in | Wt 326.0 lb

## 2018-06-06 DIAGNOSIS — N764 Abscess of vulva: Secondary | ICD-10-CM | POA: Diagnosis not present

## 2018-06-06 NOTE — Patient Instructions (Signed)
Perianal Abscess  An abscess is an infected area that is filled with pus. A perianal abscess occurs in the perineum, which is the area between the anus and the scrotum in males and between the anus and the vagina in females. Perianal abscesses can vary in size. Without treatment, a perianal abscess can become larger and cause other problems.  What are the causes?  This condition is caused by:  · Waste from damaged or dead tissue (debris) that plugs up glands in the perineum. When this happens, an abscess may form.  · Infections of the perineum.    What are the signs or symptoms?  Common symptoms of this condition include:  · Swelling and redness in the area of the abscess. The redness may go beyond the abscess and appear as a red streak on the skin.  · Pain in the area of the abscess, including pain when sitting, walking, or passing stool.    Other possible symptoms include:  · A visible, painful lump, or a lump that can be felt when touched.  · Bleeding or pus-like discharge from the area.  · Fever.  · General weakness.    How is this diagnosed?  This condition is diagnosed based on your medical history and a physical exam of the affected area.  · This may involve examining the rectal area with a gloved hand (digital rectal exam).  · Sometimes, the health care provider needs to look into the rectum using a probe or a scope.  · For women, it may require a careful vaginal exam.    How is this treated?  Treatment for this condition may include:  · Making a cut (incision) in the abscess to drain the pus. This can sometimes be done in your health care provider's office or an emergency department after you are given medicine to numb the area (local anesthetic).  · Surgery to drain the abscess. This is for larger or deeper abscesses.  · Antibiotic medicines, if there is infection in the surrounding tissue (cellulitis).  · Having gauze packed into the abscess to continue draining the area.  · Frequent baths in warm water  that is deep enough to cover your hips and buttocks (sitz baths). These help the wound heal and they make the abscess less likely to come back.    Follow these instructions at home:  Medicines  · Take over-the-counter and prescription medicines for pain, fever, or discomfort only as told by your health care provider.  · If you were prescribed an antibiotic medicine, use it as told by your health care provider. Do not stop using the antibiotic even if you start to feel better.  · Do not drive or use heavy machinery while taking prescription pain medicine.  Wound care    · Keep the skin around the wound clean and dry. Avoid cleaning the area too much.  · Avoid scratching the wound.  · Avoid using colored or perfumed toilet papers.  · Take a sitz bath 3-4 times a day and after bowel movements. This will help reduce pain and swelling.  · If directed, apply ice to the injured area:  ? Put ice in a plastic bag.  ? Place a towel between your skin and the bag.  ? Leave the ice on for 20 minutes, 2-3 times a day.  · Check your incision area every day for signs of infection. Check for:  ? More redness, swelling, or pain.  ? More fluid or blood.  ?   Warmth.  ? Pus or a bad smell.  Gauze  · If gauze was used in the abscess, follow instructions from your health care provider about removing or changing the gauze. It can usually be removed in 2-3 days.  · Wash your hands with soap and water before you remove or change your gauze. If soap and water are not available, use hand sanitizer.  · If one or more drains were placed in the abscess cavity, be careful not to pull at them. Your health care provider will tell you how long they need to remain in place.  General instructions  · Keep all follow-up visits as told by your health care provider. This is important.  Contact a health care provider if:  · You have trouble passing stool or passing urine.  · Your pain or swelling in the affected area does not seem to be getting  better.  · The gauze packing or the drains come out before the planned time.  Get help right away if:  · You have problems moving or using your legs.  · You have severe or increasing pain.  · Your swelling in the affected area suddenly gets worse.  · You have a large increase in bleeding or passing of pus.  · You have chills or a fever.  This information is not intended to replace advice given to you by your health care provider. Make sure you discuss any questions you have with your health care provider.  Document Released: 10/19/2006 Document Revised: 04/01/2016 Document Reviewed: 02/22/2016  Elsevier Interactive Patient Education © 2018 Elsevier Inc.

## 2018-06-06 NOTE — Progress Notes (Signed)
   Subjective:    Patient ID: Pamela Solis is a 57 y.o. female presenting with Follow-up  on 06/06/2018  HPI: S/p I and D of her labia in August. Treated with Bactrim. Still feels an area of swelling and would lik to have it checked. There is no pain. No shaving.She has no fever or chills.  Review of Systems  Constitutional: Negative for chills and fever.  Respiratory: Negative for shortness of breath.   Cardiovascular: Negative for chest pain.  Gastrointestinal: Negative for abdominal pain, nausea and vomiting.  Genitourinary: Negative for dysuria.  Skin: Negative for rash.      Objective:    BP 136/61   Pulse 73   Ht 5\' 6"  (1.676 m)   Wt (!) 326 lb 0.6 oz (147.9 kg)   BMI 52.62 kg/m  Physical Exam  Constitutional: She is oriented to person, place, and time. She appears well-developed and well-nourished. No distress.  HENT:  Head: Normocephalic and atraumatic.  Eyes: No scleral icterus.  Neck: Neck supple.  Cardiovascular: Normal rate.  Pulmonary/Chest: Effort normal.  Abdominal: Soft.  Genitourinary: Vagina normal. There is rash (slight erythema, c/w yeast) on the right labia. There is no lesion (no tenderness, no induration) on the right labia.  Neurological: She is alert and oriented to person, place, and time.  Skin: Skin is warm and dry.  Psychiatric: She has a normal mood and affect.        Assessment & Plan:   Problem List Items Addressed This Visit      Unprioritized   Vulvar abscess - Primary    Completely resolved. No further w/u needed.         Total face-to-face time with patient: 10 minutes. Over 50% of encounter was spent on counseling and coordination of care. Return if symptoms worsen or fail to improve.  Reva Bores 06/06/2018 2:05 PM

## 2018-06-06 NOTE — Assessment & Plan Note (Signed)
Completely resolved. No further w/u needed.

## 2018-06-21 MED FILL — MONTELUKAST SOD 10 MG TAB: 10 | 30 days supply | Qty: 30 | Fill #2

## 2018-07-25 ENCOUNTER — Telehealth: Payer: Self-pay | Admitting: Family Medicine

## 2018-07-25 NOTE — Telephone Encounter (Signed)
Copied from CRM [id number]. Topic: Quick Communication - See Telephone Encounter >> Jul 25, 2018  2:33 PM Ninfa Meeker H wrote: CRM for notification. See Telephone encounter for: 07/25/18.  Left message appt needs to be rescheduled per pcp.

## 2018-07-27 DIAGNOSIS — H40003 Preglaucoma, unspecified, bilateral: Secondary | ICD-10-CM | POA: Diagnosis not present

## 2018-07-27 DIAGNOSIS — H40011 Open angle with borderline findings, low risk, right eye: Secondary | ICD-10-CM | POA: Diagnosis not present

## 2018-08-14 ENCOUNTER — Other Ambulatory Visit: Payer: Self-pay | Admitting: Family Medicine

## 2018-08-14 MED FILL — MONTELUKAST SOD 10 MG TAB: 10 | 30 days supply | Qty: 30 | Fill #0

## 2018-08-14 MED FILL — ESCITALOPRAM 20 MG TABLET: 20 | 30 days supply | Qty: 30 | Fill #1

## 2018-08-14 MED FILL — CETIRIZINE HCL 10 MG TABLET: 10 | 100 days supply | Qty: 100 | Fill #0

## 2018-09-21 ENCOUNTER — Ambulatory Visit: Payer: 59 | Admitting: Family Medicine

## 2018-09-28 ENCOUNTER — Ambulatory Visit: Payer: 59 | Admitting: Family Medicine

## 2018-10-25 MED FILL — MONTELUKAST SOD 10 MG TAB: 10 | 30 days supply | Qty: 30 | Fill #1

## 2018-10-25 MED FILL — ESCITALOPRAM 20 MG TABLET: 20 | 30 days supply | Qty: 30 | Fill #2

## 2018-11-01 ENCOUNTER — Ambulatory Visit: Payer: 59 | Admitting: Family Medicine

## 2018-11-27 ENCOUNTER — Ambulatory Visit: Payer: Commercial Managed Care - PPO | Admitting: Family Medicine

## 2018-12-06 ENCOUNTER — Other Ambulatory Visit: Payer: Self-pay

## 2018-12-06 ENCOUNTER — Encounter: Payer: Self-pay | Admitting: Family Medicine

## 2018-12-06 ENCOUNTER — Ambulatory Visit: Payer: 59 | Admitting: Family Medicine

## 2018-12-06 VITALS — BP 130/86 | HR 82 | Temp 98.2°F | Resp 18 | Ht 66.0 in | Wt 335.0 lb

## 2018-12-06 DIAGNOSIS — I10 Essential (primary) hypertension: Secondary | ICD-10-CM | POA: Diagnosis not present

## 2018-12-06 DIAGNOSIS — M545 Low back pain, unspecified: Secondary | ICD-10-CM

## 2018-12-06 DIAGNOSIS — M5441 Lumbago with sciatica, right side: Secondary | ICD-10-CM

## 2018-12-06 DIAGNOSIS — M25551 Pain in right hip: Secondary | ICD-10-CM

## 2018-12-06 DIAGNOSIS — R000 Tachycardia, unspecified: Secondary | ICD-10-CM | POA: Diagnosis not present

## 2018-12-06 DIAGNOSIS — E669 Obesity, unspecified: Secondary | ICD-10-CM | POA: Diagnosis not present

## 2018-12-06 NOTE — Assessment & Plan Note (Signed)
Well controlled, no changes to meds. Encouraged heart healthy diet such as the DASH diet and exercise as tolerated.  °

## 2018-12-06 NOTE — Patient Instructions (Signed)
Ibuprofen 800 mg tab, take 1/2 tab every 4-6 hours as needed  Lidocaine patches or gel by Aspercreme, Salon Pas and Icy Hot al Chronic Back Pain When back pain lasts longer than 3 months, it is called chronic back pain.The cause of your back pain may not be known. Some common causes include:  Wear and tear (degenerative disease) of the bones, ligaments, or disks in your back.  Inflammation and stiffness in your back (arthritis). People who have chronic back pain often go through certain periods in which the pain is more intense (flare-ups). Many people can learn to manage the pain with home care. Follow these instructions at home: Pay attention to any changes in your symptoms. Take these actions to help with your pain: Activity   Avoid bending and other activities that make the problem worse.  Maintain a proper position when standing or sitting: ? When standing, keep your upper back and neck straight, with your shoulders pulled back. Avoid slouching. ? When sitting, keep your back straight and relax your shoulders. Do not round your shoulders or pull them backward.  Do not sit or stand in one place for long periods of time.  Take brief periods of rest throughout the day. This will reduce your pain. Resting in a lying or standing position is usually better than sitting to rest.  When you are resting for longer periods, mix in some mild activity or stretching between periods of rest. This will help to prevent stiffness and pain.  Get regular exercise. Ask your health care provider what activities are safe for you.  Do not lift anything that is heavier than 10 lb (4.5 kg). Always use proper lifting technique, which includes: ? Bending your knees. ? Keeping the load close to your body. ? Avoiding twisting.  Sleep on a firm mattress in a comfortable position. Try lying on your side with your knees slightly bent. If you lie on your back, put a pillow under your knees. Managing pain   If directed, apply ice to the painful area. Your health care provider may recommend applying ice during the first 24-48 hours after a flare-up begins. ? Put ice in a plastic bag. ? Place a towel between your skin and the bag. ? Leave the ice on for 20 minutes, 2-3 times per day.  If directed, apply heat to the affected area as often as told by your health care provider. Use the heat source that your health care provider recommends, such as a moist heat pack or a heating pad. ? Place a towel between your skin and the heat source. ? Leave the heat on for 20-30 minutes. ? Remove the heat if your skin turns bright red. This is especially important if you are unable to feel pain, heat, or cold. You may have a greater risk of getting burned.  Try soaking in a warm tub.  Take over-the-counter and prescription medicines only as told by your health care provider.  Keep all follow-up visits as told by your health care provider. This is important. Contact a health care provider if:  You have pain that is not relieved with rest or medicine. Get help right away if:  You have weakness or numbness in one or both of your legs or feet.  You have trouble controlling your bladder or your bowels.  You have nausea or vomiting.  You have pain in your abdomen.  You have shortness of breath or you faint. This information is not intended to replace  advice given to you by your health care provider. Make sure you discuss any questions you have with your health care provider. Document Released: 10/20/2004 Document Revised: 04/19/2018 Document Reviewed: 03/22/2017 Elsevier Interactive Patient Education  2019 [employer]. l make a version

## 2018-12-09 DIAGNOSIS — M545 Low back pain, unspecified: Secondary | ICD-10-CM | POA: Insufficient documentation

## 2018-12-09 NOTE — Assessment & Plan Note (Signed)
Improved on recheck.  

## 2018-12-09 NOTE — Progress Notes (Signed)
Subjective:    Patient ID: Pamela Solis, female    DOB: 21-Jul-1961, 58 y.o.   MRN: [id number]  No chief complaint on file.   HPI Patient is in today for follow-up.  Overall she is doing well except for some trouble with increased low back pain and right hip pain she denies any recent fall or trauma.  No urinary or bowel incontinence.  The back pain is been happening off and on for a couple of months but will not resolve.  Symptoms only radiate to the right hip but not down the leg. No recent febrile illness or hospitalizations. Denies CP/palp/SOB/HA/congestion/fevers/GI or GU c/o. Taking meds as prescribed  Past Medical History:  Diagnosis Date  . Acute bronchitis 01/21/2016  . Allergic state 01/31/2017  . Allergy   . Anemia   . Asthma   . Cervical cancer screening 06/25/2015  . Chronic kidney disease   . Depression   . Edema 11/24/2014  . Hypertension   . Obesity   . Preventative health care 07/31/2016  . Snoring 11/24/2014  . Thoracic outlet syndrome 11/24/2014    Past Surgical History:  Procedure Laterality Date  . CESAREAN SECTION    . ENDOMETRIAL ABLATION  2011  . TUBAL LIGATION      Family History  Problem Relation Age of Onset  . Obesity Mother   . Arthritis Mother        rheumatoid  . Stroke Father     Social History   Socioeconomic History  . Marital status: Married    Spouse name: Not on file  . Number of children: Not on file  . Years of education: Not on file  . Highest education level: Not on file  Occupational History  . Not on file  Social Needs  . Financial resource strain: Not on file  . Food insecurity:    Worry: Not on file    Inability: Not on file  . Transportation needs:    Medical: Not on file    Non-medical: Not on file  Tobacco Use  . Smoking status: Never Smoker  . Smokeless tobacco: Never Used  Substance and Sexual Activity  . Alcohol use: No  . Drug use: No  . Sexual activity: Not on file    Comment: lives with husband and  daughters, works for [employer] no dietary restrictions  Lifestyle  . Physical activity:    Days per week: Not on file    Minutes per session: Not on file  . Stress: Not on file  Relationships  . Social connections:    Talks on phone: Not on file    Gets together: Not on file    Attends religious service: Not on file    Active member of club or organization: Not on file    Attends meetings of clubs or organizations: Not on file    Relationship status: Not on file  . Intimate partner violence:    Fear of current or ex partner: Not on file    Emotionally abused: Not on file    Physically abused: Not on file    Forced sexual activity: Not on file  Other Topics Concern  . Not on file  Social History Narrative  . Not on file    Outpatient Medications Prior to Visit  Medication Sig Dispense Refill  . albuterol (PROVENTIL HFA;VENTOLIN HFA) 108 (90 Base) MCG/ACT inhaler Inhale 2 puffs into the lungs every 6 (six) hours as needed. For wheezing 1 Inhaler 1  .  cetirizine (ZYRTEC) 10 MG tablet TAKE 1 TABLET (10 MG TOTAL) BY MOUTH DAILY. 100 tablet 3  . escitalopram (LEXAPRO) 20 MG tablet Take 1 tablet (20 mg total) by mouth daily. 30 tablet 5  . ibuprofen (ADVIL,MOTRIN) 800 MG tablet Take 1 tablet (800 mg total) by mouth every 8 (eight) hours as needed. For migraine 270 tablet 0  . montelukast (SINGULAIR) 10 MG tablet TAKE 1 TABLET (10 MG TOTAL) BY MOUTH DAILY. 30 tablet 2  . SYMBICORT 80-4.5 MCG/ACT inhaler INHALE 2 PUFFS BY MOUTH INTO THE LUNGS 2 (TWO) TIMES DAILY. BEGIN USING THIS INHALER ONCE YOU HAVE COMPLETED THE ORAL PREDNISONE DOSING SCHE 10.2 g 0  . triamterene-hydrochlorothiazide (MAXZIDE-25) 37.5-25 MG tablet Take 1 tablet by mouth every morning. 30 tablet 5   No facility-administered medications prior to visit.     Allergies  Allergen Reactions  . Penicillins Rash    Review of Systems  Constitutional: Negative for fever and malaise/fatigue.  HENT: Negative for congestion.    Eyes: Negative for blurred vision.  Respiratory: Negative for shortness of breath.   Cardiovascular: Negative for chest pain, palpitations and leg swelling.  Gastrointestinal: Negative for abdominal pain, blood in stool and nausea.  Genitourinary: Negative for dysuria and frequency.  Musculoskeletal: Positive for back pain and joint pain. Negative for falls.  Skin: Negative for rash.  Neurological: Negative for dizziness, loss of consciousness and headaches.  Endo/Heme/Allergies: Negative for environmental allergies.  Psychiatric/Behavioral: Negative for depression. The patient is not nervous/anxious.        Objective:    Physical Exam Vitals signs and nursing note reviewed.  Constitutional:      General: She is not in acute distress.    Appearance: She is well-developed.  HENT:     Head: Normocephalic and atraumatic.     Nose: Nose normal.  Eyes:     General:        Right eye: No discharge.        Left eye: No discharge.  Neck:     Musculoskeletal: Normal range of motion and neck supple.  Cardiovascular:     Rate and Rhythm: Normal rate and regular rhythm.     Heart sounds: No murmur.  Pulmonary:     Effort: Pulmonary effort is normal.     Breath sounds: Normal breath sounds.  Abdominal:     General: Bowel sounds are normal.     Palpations: Abdomen is soft.     Tenderness: There is no abdominal tenderness.  Skin:    General: Skin is warm and dry.  Neurological:     Mental Status: She is alert and oriented to person, place, and time.     BP 130/86 (BP Location: Left Arm, Patient Position: Sitting, Cuff Size: Large)   Pulse 82   Temp 98.2 F (36.8 C) (Oral)   Resp 18   Ht 5\' 6"  (1.676 m)   Wt (!) 335 lb (152 kg)   SpO2 96%   BMI 54.07 kg/m  Wt Readings from Last 3 Encounters:  12/06/18 (!) 335 lb (152 kg)  06/06/18 (!) 326 lb 0.6 oz (147.9 kg)  05/14/18 (!) 319 lb (144.7 kg)     Lab Results  Component Value Date   WBC 12.4 (H) 03/09/2018   HGB 14.2  03/09/2018   HCT 43.0 03/09/2018   PLT 319.0 03/09/2018   GLUCOSE 91 07/24/2017   CHOL 160 07/24/2017   TRIG 108.0 07/24/2017   HDL 50.40 07/24/2017   LDLCALC 88 07/24/2017  ALT 35 07/24/2017   AST 38 (H) 07/24/2017   NA 136 07/24/2017   K 3.8 07/24/2017   CL 100 07/24/2017   CREATININE 0.68 07/24/2017   BUN 12 07/24/2017   CO2 28 07/24/2017   TSH 2.37 07/24/2017   HGBA1C 5.7 03/09/2018    Lab Results  Component Value Date   TSH 2.37 07/24/2017   Lab Results  Component Value Date   WBC 12.4 (H) 03/09/2018   HGB 14.2 03/09/2018   HCT 43.0 03/09/2018   MCV 90.1 03/09/2018   PLT 319.0 03/09/2018   Lab Results  Component Value Date   NA 136 07/24/2017   K 3.8 07/24/2017   CO2 28 07/24/2017   GLUCOSE 91 07/24/2017   BUN 12 07/24/2017   CREATININE 0.68 07/24/2017   BILITOT 0.6 07/24/2017   ALKPHOS 92 07/24/2017   AST 38 (H) 07/24/2017   ALT 35 07/24/2017   PROT 7.4 07/24/2017   ALBUMIN 3.7 07/24/2017   CALCIUM 9.2 07/24/2017   GFR 95.80 07/24/2017   Lab Results  Component Value Date   CHOL 160 07/24/2017   Lab Results  Component Value Date   HDL 50.40 07/24/2017   Lab Results  Component Value Date   LDLCALC 88 07/24/2017   Lab Results  Component Value Date   TRIG 108.0 07/24/2017   Lab Results  Component Value Date   CHOLHDL 3 07/24/2017   Lab Results  Component Value Date   HGBA1C 5.7 03/09/2018       Assessment & Plan:   Problem List Items Addressed This Visit    HTN (hypertension)    Well controlled, no changes to meds. Encouraged heart healthy diet such as the DASH diet and exercise as tolerated.       Tachycardia    Improved on recheck      Obesity    Encouraged DASH diet, decrease po intake and increase exercise as tolerated. Needs 7-8 hours of sleep nightly. Avoid trans fats, eat small, frequent meals every 4-5 hours with lean proteins, complex carbs and healthy fats. Minimize simple carbs      Low back pain    Struggling  with low back pain and right hip pain. No recent trauma. Proceed with xrays. Encouraged moist heat and gentle stretching as tolerated. May try NSAIDs and prescription meds as directed and report if symptoms worsen or seek immediate care       Other Visit Diagnoses    Right hip pain    -  Primary   Relevant Orders   DG Hip Unilat W OR W/O Pelvis 2-3 Views Right   Lumbar pain       Relevant Orders   DG Lumbar Spine Complete      I am having Pamela Solis maintain her ibuprofen, albuterol, triamterene-hydrochlorothiazide, Symbicort, escitalopram, montelukast, and cetirizine.  No orders of the defined types were placed in this encounter.    Danise EdgeStacey Juelz Whittenberg, MD

## 2018-12-09 NOTE — Assessment & Plan Note (Signed)
Struggling with low back pain and right hip pain. No recent trauma. Proceed with xrays. Encouraged moist heat and gentle stretching as tolerated. May try NSAIDs and prescription meds as directed and report if symptoms worsen or seek immediate care

## 2018-12-09 NOTE — Assessment & Plan Note (Signed)
Encouraged DASH diet, decrease po intake and increase exercise as tolerated. Needs 7-8 hours of sleep nightly. Avoid trans fats, eat small, frequent meals every 4-5 hours with lean proteins, complex carbs and healthy fats. Minimize simple carbs 

## 2018-12-28 MED FILL — CETIRIZINE HCL 10 MG TABS: 10 | 100 days supply | Qty: 100 | Fill #1

## 2018-12-28 MED FILL — MONTELUKAST SOD 10 MG TAB: 10 | 30 days supply | Qty: 30 | Fill #2

## 2018-12-28 MED FILL — ESCITALOPRAM 20 MG TABLET: 20 | 30 days supply | Qty: 30 | Fill #3

## 2019-03-18 ENCOUNTER — Other Ambulatory Visit: Payer: Self-pay | Admitting: Family Medicine

## 2019-03-18 MED FILL — ESCITALOPRAM 20 MG TABLET: 20 | 30 days supply | Qty: 30 | Fill #4

## 2019-03-19 MED FILL — MONTELUKAST SOD 10 MG TAB: 10 | 30 days supply | Qty: 30 | Fill #0

## 2019-05-30 ENCOUNTER — Other Ambulatory Visit: Payer: Self-pay | Admitting: Family Medicine

## 2019-05-30 MED FILL — MONTELUKAST SOD 10 MG TAB: 10 | 30 days supply | Qty: 30 | Fill #1

## 2019-05-31 MED FILL — ESCITALOPRAM 20 MG TABLET: 20 | 30 days supply | Qty: 30 | Fill #0

## 2019-07-25 ENCOUNTER — Other Ambulatory Visit: Payer: Self-pay | Admitting: Family Medicine

## 2019-07-25 MED FILL — MONTELUKAST SOD 10 MG TAB: 10 | 30 days supply | Qty: 30 | Fill #2

## 2019-07-25 MED FILL — ESCITALOPRAM 20 MG TABLET: 20 | 30 days supply | Qty: 30 | Fill #1

## 2019-07-25 MED FILL — CETIRIZINE HCL 10 MG TABS: 10 | 100 days supply | Qty: 100 | Fill #2

## 2019-07-26 MED FILL — TRIAMTERENE-HCTZ 37.5-25 MG: 37.5-25 | 30 days supply | Qty: 30 | Fill #0

## 2019-09-03 ENCOUNTER — Ambulatory Visit (INDEPENDENT_AMBULATORY_CARE_PROVIDER_SITE_OTHER): Payer: 59 | Admitting: Family Medicine

## 2019-09-03 ENCOUNTER — Other Ambulatory Visit: Payer: Self-pay

## 2019-09-03 DIAGNOSIS — I10 Essential (primary) hypertension: Secondary | ICD-10-CM | POA: Diagnosis not present

## 2019-09-03 DIAGNOSIS — D649 Anemia, unspecified: Secondary | ICD-10-CM

## 2019-09-03 DIAGNOSIS — R739 Hyperglycemia, unspecified: Secondary | ICD-10-CM

## 2019-09-03 DIAGNOSIS — E785 Hyperlipidemia, unspecified: Secondary | ICD-10-CM

## 2019-09-03 DIAGNOSIS — Z1231 Encounter for screening mammogram for malignant neoplasm of breast: Secondary | ICD-10-CM

## 2019-09-03 NOTE — Assessment & Plan Note (Signed)
hgba1c acceptable, minimize simple carbs. Increase exercise as tolerated.  

## 2019-09-03 NOTE — Assessment & Plan Note (Signed)
Stable on current meds, no changes. 

## 2019-09-03 NOTE — Assessment & Plan Note (Signed)
Monitor and report any concerns. no changes to meds. Encouraged heart healthy diet such as the DASH diet and exercise as tolerated.  

## 2019-09-04 ENCOUNTER — Telehealth: Payer: Self-pay | Admitting: Family Medicine

## 2019-09-04 NOTE — Telephone Encounter (Signed)
LM for pt to call back.  Please schedule lab appt week of 09/09/2019-09/13/2019. Please schedule CPE with Dr. Charlett Blake 2nd week of June 2021.

## 2019-09-09 DIAGNOSIS — E785 Hyperlipidemia, unspecified: Secondary | ICD-10-CM | POA: Insufficient documentation

## 2019-09-09 NOTE — Assessment & Plan Note (Signed)
Increase leafy greens, consider increased lean red meat and using cast iron cookware. Continue to monitor, report any concerns 

## 2019-09-09 NOTE — Assessment & Plan Note (Signed)
Encouraged heart healthy diet, increase exercise, avoid trans fats, consider a krill oil cap daily 

## 2019-09-09 NOTE — Progress Notes (Signed)
Virtual Visit via Video Note  I connected with Pamela Solis on 09/03/19 at  9:40 AM EST by a video enabled telemedicine application and verified that I am speaking with the correct person using two identifiers.  Location: Patient: home Provider: home   I discussed the limitations of evaluation and management by telemedicine and the availability of in person appointments. The patient expressed understanding and agreed to proceed. CMA was able to set the patient up with a video visit.    Subjective:    Patient ID: Pamela Solis, female    DOB: Apr 15, 1961, 58 y.o.   MRN: [id number]  No chief complaint on file.   HPI Patient is in today for follow up on chronic medical concerns including hypertension, anemia, hyperlipidemia, and more. She feels well. No recent febrile illness or hospitalizations. No polyuria or polydipsia. Is trying to maintain a heart healthy illness and stay active. Is maintaining quarantine well. Denies CP/palp/SOB/HA/congestion/fevers/GI or GU c/o. Taking meds as prescribed  Past Medical History:  Diagnosis Date  . Acute bronchitis 01/21/2016  . Allergic state 01/31/2017  . Allergy   . Anemia   . Asthma   . Cervical cancer screening 06/25/2015  . Chronic kidney disease   . Depression   . Edema 11/24/2014  . Hypertension   . Obesity   . Preventative health care 07/31/2016  . Snoring 11/24/2014  . Thoracic outlet syndrome 11/24/2014    Past Surgical History:  Procedure Laterality Date  . CESAREAN SECTION    . ENDOMETRIAL ABLATION  2011  . TUBAL LIGATION      Family History  Problem Relation Age of Onset  . Obesity Mother   . Arthritis Mother        rheumatoid  . Stroke Father     Social History   Socioeconomic History  . Marital status: Married    Spouse name: Not on file  . Number of children: Not on file  . Years of education: Not on file  . Highest education level: Not on file  Occupational History  . Not on file  Tobacco Use  . Smoking  status: Never Smoker  . Smokeless tobacco: Never Used  Substance and Sexual Activity  . Alcohol use: No  . Drug use: No  . Sexual activity: Not on file    Comment: lives with husband and daughters, works for [employer] no dietary restrictions  Other Topics Concern  . Not on file  Social History Narrative  . Not on file   Social Determinants of Health   Financial Resource Strain:   . Difficulty of Paying Living Expenses: Not on file  Food Insecurity:   . Worried About Programme researcher, broadcasting/film/video in the Last Year: Not on file  . Ran Out of Food in the Last Year: Not on file  Transportation Needs:   . Lack of Transportation (Medical): Not on file  . Lack of Transportation (Non-Medical): Not on file  Physical Activity:   . Days of Exercise per Week: Not on file  . Minutes of Exercise per Session: Not on file  Stress:   . Feeling of Stress : Not on file  Social Connections:   . Frequency of Communication with Friends and Family: Not on file  . Frequency of Social Gatherings with Friends and Family: Not on file  . Attends Religious Services: Not on file  . Active Member of Clubs or Organizations: Not on file  . Attends Banker Meetings: Not on file  .  Marital Status: Not on file  Intimate Partner Violence:   . Fear of Current or Ex-Partner: Not on file  . Emotionally Abused: Not on file  . Physically Abused: Not on file  . Sexually Abused: Not on file    Outpatient Medications Prior to Visit  Medication Sig Dispense Refill  . albuterol (PROVENTIL HFA;VENTOLIN HFA) 108 (90 Base) MCG/ACT inhaler Inhale 2 puffs into the lungs every 6 (six) hours as needed. For wheezing 1 Inhaler 1  . cetirizine (ZYRTEC) 10 MG tablet TAKE 1 TABLET (10 MG TOTAL) BY MOUTH DAILY. 100 tablet 3  . escitalopram (LEXAPRO) 20 MG tablet TAKE 1 TABLET (20 MG TOTAL) BY MOUTH DAILY. 30 tablet 5  . ibuprofen (ADVIL,MOTRIN) 800 MG tablet Take 1 tablet (800 mg total) by mouth every 8 (eight) hours as needed. For  migraine 270 tablet 0  . montelukast (SINGULAIR) 10 MG tablet TAKE 1 TABLET (10 MG TOTAL) BY MOUTH DAILY. 30 tablet 2  . SYMBICORT 80-4.5 MCG/ACT inhaler INHALE 2 PUFFS BY MOUTH INTO THE LUNGS 2 (TWO) TIMES DAILY. BEGIN USING THIS INHALER ONCE YOU HAVE COMPLETED THE ORAL PREDNISONE DOSING SCHE 10.2 g 0  . triamterene-hydrochlorothiazide (MAXZIDE-25) 37.5-25 MG tablet TAKE 1 TABLET BY MOUTH EVERY MORNING. 30 tablet 5   No facility-administered medications prior to visit.    Allergies  Allergen Reactions  . Penicillins Rash    Review of Systems  Constitutional: Negative for fever and malaise/fatigue.  HENT: Negative for congestion.   Eyes: Negative for blurred vision.  Respiratory: Negative for shortness of breath.   Cardiovascular: Negative for chest pain, palpitations and leg swelling.  Gastrointestinal: Negative for abdominal pain, blood in stool and nausea.  Genitourinary: Negative for dysuria and frequency.  Musculoskeletal: Negative for falls.  Skin: Negative for rash.  Neurological: Negative for dizziness, loss of consciousness and headaches.  Endo/Heme/Allergies: Negative for environmental allergies.  Psychiatric/Behavioral: Negative for depression. The patient is not nervous/anxious.        Objective:    Physical Exam Constitutional:      Appearance: Normal appearance. She is not ill-appearing.  HENT:     Head: Normocephalic and atraumatic.     Nose: Nose normal.  Eyes:     General:        Right eye: No discharge.        Left eye: No discharge.  Pulmonary:     Effort: Pulmonary effort is normal.  Neurological:     Mental Status: She is alert and oriented to person, place, and time.  Psychiatric:        Mood and Affect: Mood normal.        Behavior: Behavior normal.     There were no vitals taken for this visit. Wt Readings from Last 3 Encounters:  01/24/12 294 lb 15.6 oz (133.8 kg)    Diabetic Foot Exam - Simple   No data filed     Lab Results    Component Value Date   WBC 12.4 (H) 03/09/2018   HGB 14.2 03/09/2018   HCT 43.0 03/09/2018   PLT 319.0 03/09/2018   GLUCOSE 91 07/24/2017   CHOL 160 07/24/2017   TRIG 108.0 07/24/2017   HDL 50.40 07/24/2017   LDLCALC 88 07/24/2017   ALT 35 07/24/2017   AST 38 (H) 07/24/2017   NA 136 07/24/2017   K 3.8 07/24/2017   CL 100 07/24/2017   CREATININE 0.68 07/24/2017   BUN 12 07/24/2017   CO2 28 07/24/2017   TSH 2.37  07/24/2017   HGBA1C 5.7 03/09/2018    Lab Results  Component Value Date   TSH 2.37 07/24/2017   Lab Results  Component Value Date   WBC 12.4 (H) 03/09/2018   HGB 14.2 03/09/2018   HCT 43.0 03/09/2018   MCV 90.1 03/09/2018   PLT 319.0 03/09/2018   Lab Results  Component Value Date   NA 136 07/24/2017   K 3.8 07/24/2017   CO2 28 07/24/2017   GLUCOSE 91 07/24/2017   BUN 12 07/24/2017   CREATININE 0.68 07/24/2017   BILITOT 0.6 07/24/2017   ALKPHOS 92 07/24/2017   AST 38 (H) 07/24/2017   ALT 35 07/24/2017   PROT 7.4 07/24/2017   ALBUMIN 3.7 07/24/2017   CALCIUM 9.2 07/24/2017   GFR 95.80 07/24/2017   Lab Results  Component Value Date   CHOL 160 07/24/2017   Lab Results  Component Value Date   HDL 50.40 07/24/2017   Lab Results  Component Value Date   LDLCALC 88 07/24/2017   Lab Results  Component Value Date   TRIG 108.0 07/24/2017   Lab Results  Component Value Date   CHOLHDL 3 07/24/2017   Lab Results  Component Value Date   HGBA1C 5.7 03/09/2018       Assessment & Plan:   Problem List Items Addressed This Visit    HTN (hypertension)    Monitor and report any concernsno changes to meds. Encouraged heart healthy diet such as the DASH diet and exercise as tolerated.       Relevant Orders   CBC   CMP   TSH   Anemia    Increase leafy greens, consider increased lean red meat and using cast iron cookware. Continue to monitor, report any concerns      Hyperglycemia    hgba1c acceptable, minimize simple carbs. Increase exercise  as tolerated.       Relevant Orders   A1C   Hyperlipidemia    Encouraged heart healthy diet, increase exercise, avoid trans fats, consider a krill oil cap daily      Relevant Orders   Lipid panel    Other Visit Diagnoses    Encounter for screening mammogram for malignant neoplasm of breast    -  Primary   Relevant Orders   MAMMOGRAM TOMO 3-D (HP)      I am having Edwina Barth. Melander maintain her ibuprofen, albuterol, Symbicort, cetirizine, montelukast, escitalopram, and triamterene-hydrochlorothiazide.  No orders of the defined types were placed in this encounter.   I discussed the assessment and treatment plan with the patient. The patient was provided an opportunity to ask questions and all were answered. The patient agreed with the plan and demonstrated an understanding of the instructions.   The patient was advised to call back or seek an in-person evaluation if the symptoms worsen or if the condition fails to improve as anticipated.  I provided 25 minutes of non-face-to-face time during this encounter.   Penni Homans, MD

## 2019-10-01 ENCOUNTER — Other Ambulatory Visit: Payer: Self-pay | Admitting: Family Medicine

## 2019-10-01 MED FILL — MONTELUKAST SOD 10 MG TAB: 10 | 30 days supply | Qty: 30 | Fill #0

## 2019-10-01 MED FILL — TRIAMTERENE-HCTZ 37.5-25 MG: 37.5-25 | 30 days supply | Qty: 30 | Fill #1

## 2019-10-01 MED FILL — ESCITALOPRAM 20 MG TABLET: 20 | 30 days supply | Qty: 30 | Fill #2

## 2019-10-25 MED FILL — MONTELUKAST SOD 10 MG TAB: 10 | 30 days supply | Qty: 30 | Fill #1

## 2019-10-25 MED FILL — ESCITALOPRAM 20 MG TABLET: 20 | 30 days supply | Qty: 30 | Fill #3

## 2019-10-25 MED FILL — TRIAMTERENE-HCTZ 37.5-25 MG: 37.5-25 | 30 days supply | Qty: 30 | Fill #2

## 2019-11-01 MED FILL — MONTELUKAST SOD 10 MG TAB: 10 | 30 days supply | Qty: 30 | Fill #1

## 2019-11-01 MED FILL — ESCITALOPRAM 20 MG TABLET: 20 | 30 days supply | Qty: 30 | Fill #3

## 2019-11-01 MED FILL — TRIAMTERENE-HCTZ 37.5-25 MG: 37.5-25 | 30 days supply | Qty: 30 | Fill #2

## 2019-11-19 ENCOUNTER — Other Ambulatory Visit: Payer: Self-pay | Admitting: Family Medicine

## 2019-11-19 MED FILL — TRIAMTERENE-HCTZ 37.5-25 MG: 37.5-25 | 30 days supply | Qty: 30 | Fill #2

## 2019-11-19 MED FILL — MONTELUKAST SOD 10 MG TAB: 10 | 30 days supply | Qty: 30 | Fill #1

## 2019-11-19 MED FILL — ESCITALOPRAM 20 MG TABLET: 20 | 30 days supply | Qty: 30 | Fill #3

## 2019-12-19 MED FILL — MONTELUKAST SOD 10 MG TAB: 10 | 30 days supply | Qty: 30 | Fill #2

## 2019-12-19 MED FILL — TRIAMTERENE-HCTZ 37.5-25 MG: 37.5-25 | 30 days supply | Qty: 30 | Fill #3

## 2019-12-19 MED FILL — ESCITALOPRAM 20 MG TABLET: 20 | 30 days supply | Qty: 30 | Fill #4

## 2019-12-31 ENCOUNTER — Ambulatory Visit (INDEPENDENT_AMBULATORY_CARE_PROVIDER_SITE_OTHER): Payer: 59 | Admitting: Family Medicine

## 2019-12-31 ENCOUNTER — Other Ambulatory Visit: Payer: Self-pay | Admitting: Family Medicine

## 2019-12-31 ENCOUNTER — Other Ambulatory Visit: Payer: Self-pay

## 2019-12-31 ENCOUNTER — Encounter: Payer: Self-pay | Admitting: Family Medicine

## 2019-12-31 VITALS — BP 135/78 | HR 81

## 2019-12-31 DIAGNOSIS — E785 Hyperlipidemia, unspecified: Secondary | ICD-10-CM | POA: Diagnosis not present

## 2019-12-31 DIAGNOSIS — I10 Essential (primary) hypertension: Secondary | ICD-10-CM | POA: Diagnosis not present

## 2019-12-31 DIAGNOSIS — F32A Depression, unspecified: Secondary | ICD-10-CM

## 2019-12-31 DIAGNOSIS — F329 Major depressive disorder, single episode, unspecified: Secondary | ICD-10-CM | POA: Diagnosis not present

## 2019-12-31 DIAGNOSIS — R609 Edema, unspecified: Secondary | ICD-10-CM | POA: Diagnosis not present

## 2019-12-31 DIAGNOSIS — D649 Anemia, unspecified: Secondary | ICD-10-CM | POA: Diagnosis not present

## 2019-12-31 DIAGNOSIS — R739 Hyperglycemia, unspecified: Secondary | ICD-10-CM

## 2019-12-31 DIAGNOSIS — Z1239 Encounter for other screening for malignant neoplasm of breast: Secondary | ICD-10-CM | POA: Diagnosis not present

## 2019-12-31 DIAGNOSIS — R000 Tachycardia, unspecified: Secondary | ICD-10-CM | POA: Diagnosis not present

## 2019-12-31 MED ORDER — ALBUTEROL SULFATE HFA 108 (90 BASE) MCG/ACT IN AERS: 2.0000 | INHALATION_SPRAY | Freq: Four times a day (QID) | RESPIRATORY_TRACT | 2 refills | Status: DC | PRN

## 2019-12-31 MED ORDER — MONTELUKAST SODIUM 10 MG PO TABS: 10.0000 mg | ORAL_TABLET | Freq: Every day | ORAL | 1 refills | Status: DC

## 2019-12-31 MED ORDER — ESCITALOPRAM OXALATE 20 MG PO TABS: 20.0000 mg | ORAL_TABLET | Freq: Every day | ORAL | 1 refills | Status: DC

## 2019-12-31 MED ORDER — TRIAMTERENE-HCTZ 37.5-25 MG PO TABS: 1.0000 | ORAL_TABLET | Freq: Every morning | ORAL | 1 refills | Status: DC

## 2019-12-31 MED ORDER — FUROSEMIDE 20 MG PO TABS: 20.0000 mg | ORAL_TABLET | Freq: Every day | ORAL | 3 refills | Status: DC | PRN

## 2019-12-31 MED FILL — MONTELUKAST SOD 10 MG TAB: 10 | 90 days supply | Qty: 90 | Fill #0

## 2019-12-31 MED FILL — FUROSEMIDE 20 MG TAB: 20 | 90 days supply | Qty: 90 | Fill #0

## 2019-12-31 MED FILL — ALBUTEROL SULFATE HFA 108 (: 108 (90 BAS | 25 days supply | Qty: 18 | Fill #0

## 2019-12-31 MED FILL — ESCITALOPRAM 20 MG TABLET: 20 | 90 days supply | Qty: 90 | Fill #0

## 2019-12-31 MED FILL — TRIAMTERENE-HCTZ 37.5-25 MG: 37.5-25 | 90 days supply | Qty: 90 | Fill #0

## 2020-01-01 ENCOUNTER — Telehealth: Payer: Self-pay | Admitting: *Deleted

## 2020-01-01 NOTE — Telephone Encounter (Signed)
Left message on machine for patient to call back to schedule appointment.  Needs lab appointment next week and in person visit 3 months after lab visit for follow up hypertension, hyperlipidemia

## 2020-01-01 NOTE — Assessment & Plan Note (Signed)
Will add Furosemide prn weight gain >3#/24 hours. Watch for signs of low potassium and if they occur increase in diet and notify us for labwork. Check cmp next week.

## 2020-01-01 NOTE — Assessment & Plan Note (Signed)
hgba1c acceptable, minimize simple carbs. Increase exercise as tolerated.  

## 2020-01-01 NOTE — Assessment & Plan Note (Signed)
Well controlled, no changes to meds. Encouraged heart healthy diet such as the DASH diet and exercise as tolerated.  °

## 2020-01-01 NOTE — Progress Notes (Signed)
Virtual Visit via Video Note  I connected with Pamela Solis on 12/31/19 at  3:40 PM EDT by a video enabled telemedicine application and verified that I am speaking with the correct person using two identifiers.  Location: Patient: home Provider: office   I discussed the limitations of evaluation and management by telemedicine and the availability of in person appointments. The patient expressed understanding and agreed to proceed. Pamela Solis, CMA was able to get the patient set up on a video visit   Subjective:    Patient ID: Pamela Solis, female    DOB: [birth year], 59 y.o.   MRN: [id number]  Chief Complaint  Patient presents with  . Edema    ankels,constant sitting    HPI Patient is in today for follow up on chronic medical concerns. She has been very sad since her mother died last month. She is managing but anhedonia and anxiety are present. While she was sitting with her mother for long hours she started having significant trouble with pedal edema prn. No recent febrile illness or hospitalizations. Denies CP/palp/SOB/HA/congestion/fevers/GI or GU c/o. Taking meds as prescribed  Past Medical History:  Diagnosis Date  . Acute bronchitis 01/21/2016  . Allergic state 01/31/2017  . Allergy   . Anemia   . Asthma   . Cervical cancer screening 06/25/2015  . Chronic kidney disease   . Depression   . Edema 11/24/2014  . Hypertension   . Obesity   . Preventative health care 07/31/2016  . Snoring 11/24/2014  . Thoracic outlet syndrome 11/24/2014    Past Surgical History:  Procedure Laterality Date  . CESAREAN SECTION    . ENDOMETRIAL ABLATION  2011  . TUBAL LIGATION      Family History  Problem Relation Age of Onset  . Obesity Mother   . Arthritis Mother        rheumatoid  . Stroke Father     Social History   Socioeconomic History  . Marital status: Married    Spouse name: Not on file  . Number of children: Not on file  . Years of education: Not on file  .  Highest education level: Not on file  Occupational History  . Not on file  Tobacco Use  . Smoking status: Never Smoker  . Smokeless tobacco: Never Used  Substance and Sexual Activity  . Alcohol use: No  . Drug use: No  . Sexual activity: Not on file    Comment: lives with husband and daughters, works for [employer] no dietary restrictions  Other Topics Concern  . Not on file  Social History Narrative  . Not on file   Social Determinants of Health   Financial Resource Strain:   . Difficulty of Paying Living Expenses:   Food Insecurity:   . Worried About Programme researcher, broadcasting/film/video in the Last Year:   . Barista in the Last Year:   Transportation Needs:   . Freight forwarder (Medical):   Marland Kitchen Lack of Transportation (Non-Medical):   Physical Activity:   . Days of Exercise per Week:   . Minutes of Exercise per Session:   Stress:   . Feeling of Stress :   Social Connections:   . Frequency of Communication with Friends and Family:   . Frequency of Social Gatherings with Friends and Family:   . Attends Religious Services:   . Active Member of Clubs or Organizations:   . Attends Banker Meetings:   Marland Kitchen Marital Status:  Intimate Partner Violence:   . Fear of Current or Ex-Partner:   . Emotionally Abused:   Marland Kitchen Physically Abused:   . Sexually Abused:     Outpatient Medications Prior to Visit  Medication Sig Dispense Refill  . cetirizine (ZYRTEC) 10 MG tablet TAKE 1 TABLET (10 MG TOTAL) BY MOUTH DAILY. 100 tablet 3  . ibuprofen (ADVIL,MOTRIN) 800 MG tablet Take 1 tablet (800 mg total) by mouth every 8 (eight) hours as needed. For migraine 270 tablet 0  . SYMBICORT 80-4.5 MCG/ACT inhaler INHALE 2 PUFFS BY MOUTH INTO THE LUNGS 2 (TWO) TIMES DAILY. BEGIN USING THIS INHALER ONCE YOU HAVE COMPLETED THE ORAL PREDNISONE DOSING SCHE 10.2 g 0  . albuterol (PROVENTIL HFA;VENTOLIN HFA) 108 (90 Base) MCG/ACT inhaler Inhale 2 puffs into the lungs every 6 (six) hours as needed. For  wheezing 1 Inhaler 1  . escitalopram (LEXAPRO) 20 MG tablet TAKE 1 TABLET (20 MG TOTAL) BY MOUTH DAILY. 30 tablet 5  . montelukast (SINGULAIR) 10 MG tablet TAKE 1 TABLET (10 MG TOTAL) BY MOUTH DAILY. 30 tablet 2  . triamterene-hydrochlorothiazide (MAXZIDE-25) 37.5-25 MG tablet TAKE 1 TABLET BY MOUTH EVERY MORNING. 30 tablet 5   No facility-administered medications prior to visit.    Allergies  Allergen Reactions  . Penicillins Rash    Review of Systems  Constitutional: Positive for malaise/fatigue. Negative for fever.  HENT: Negative for congestion.   Eyes: Negative for blurred vision.  Respiratory: Negative for shortness of breath.   Cardiovascular: Positive for leg swelling. Negative for chest pain and palpitations.  Gastrointestinal: Negative for abdominal pain, blood in stool and nausea.  Genitourinary: Negative for dysuria and frequency.  Musculoskeletal: Negative for falls.  Skin: Negative for rash.  Neurological: Negative for dizziness, loss of consciousness and headaches.  Endo/Heme/Allergies: Negative for environmental allergies.  Psychiatric/Behavioral: Positive for depression. The patient is nervous/anxious.        Objective:    Physical Exam Constitutional:      Appearance: Normal appearance. She is obese. She is not ill-appearing.  HENT:     Head: Normocephalic and atraumatic.     Right Ear: External ear normal.     Left Ear: External ear normal.     Nose: Nose normal.  Eyes:     General:        Right eye: No discharge.        Left eye: No discharge.  Pulmonary:     Effort: Pulmonary effort is normal.  Neurological:     Mental Status: She is alert and oriented to person, place, and time.  Psychiatric:        Behavior: Behavior normal.     BP 135/78   Pulse 81   SpO2 94%  Wt Readings from Last 3 Encounters:  01/24/12 294 lb 15.6 oz (133.8 kg)    Diabetic Foot Exam - Simple   No data filed     Lab Results  Component Value Date   WBC 12.4 (H)  03/09/2018   HGB 14.2 03/09/2018   HCT 43.0 03/09/2018   PLT 319.0 03/09/2018   GLUCOSE 91 07/24/2017   CHOL 160 07/24/2017   TRIG 108.0 07/24/2017   HDL 50.40 07/24/2017   LDLCALC 88 07/24/2017   ALT 35 07/24/2017   AST 38 (H) 07/24/2017   NA 136 07/24/2017   K 3.8 07/24/2017   CL 100 07/24/2017   CREATININE 0.68 07/24/2017   BUN 12 07/24/2017   CO2 28 07/24/2017   TSH 2.37 07/24/2017  HGBA1C 5.7 03/09/2018    Lab Results  Component Value Date   TSH 2.37 07/24/2017   Lab Results  Component Value Date   WBC 12.4 (H) 03/09/2018   HGB 14.2 03/09/2018   HCT 43.0 03/09/2018   MCV 90.1 03/09/2018   PLT 319.0 03/09/2018   Lab Results  Component Value Date   NA 136 07/24/2017   K 3.8 07/24/2017   CO2 28 07/24/2017   GLUCOSE 91 07/24/2017   BUN 12 07/24/2017   CREATININE 0.68 07/24/2017   BILITOT 0.6 07/24/2017   ALKPHOS 92 07/24/2017   AST 38 (H) 07/24/2017   ALT 35 07/24/2017   PROT 7.4 07/24/2017   ALBUMIN 3.7 07/24/2017   CALCIUM 9.2 07/24/2017   GFR 95.80 07/24/2017   Lab Results  Component Value Date   CHOL 160 07/24/2017   Lab Results  Component Value Date   HDL 50.40 07/24/2017   Lab Results  Component Value Date   LDLCALC 88 07/24/2017   Lab Results  Component Value Date   TRIG 108.0 07/24/2017   Lab Results  Component Value Date   CHOLHDL 3 07/24/2017   Lab Results  Component Value Date   HGBA1C 5.7 03/09/2018       Assessment & Plan:   Problem List Items Addressed This Visit    HTN (hypertension) - Primary    Well controlled, no changes to meds. Encouraged heart healthy diet such as the DASH diet and exercise as tolerated.       Relevant Medications   triamterene-hydrochlorothiazide (MAXZIDE-25) 37.5-25 MG tablet   furosemide (LASIX) 20 MG tablet   Other Relevant Orders   CBC   Comprehensive metabolic panel   TSH   Depression    She is very sad over the loss of her mother on March 2. She feels the Escitalopram has been  helpful. No changes.       Relevant Medications   escitalopram (LEXAPRO) 20 MG tablet   Tachycardia   Anemia   Edema    Will add Furosemide prn weight gain >3#/24 hours. Watch for signs of low potassium and if they occur increase in diet and notify us for labwork. Check cmp next week.       Hyperglycemia    hgba1c acceptable, minimize simple carbs. Increase exercise as tolerated.       Relevant Orders   Hemoglobin A1c   Hyperlipidemia    Encouraged heart healthy diet, increase exercise, avoid trans fats, consider a krill oil cap daily      Relevant Medications   triamterene-hydrochlorothiazide (MAXZIDE-25) 37.5-25 MG tablet   furosemide (LASIX) 20 MG tablet   Other Relevant Orders   Lipid panel    Other Visit Diagnoses    Encounter for screening for malignant neoplasm of breast, unspecified screening modality       Relevant Orders   MM 3D SCREEN BREAST BILATERAL      I have changed Nicholes Rough. Davanzo's albuterol. I am also having her start on furosemide. Additionally, I am having her maintain her ibuprofen, Symbicort, cetirizine, escitalopram, montelukast, and triamterene-hydrochlorothiazide.  Meds ordered this encounter  Medications  . escitalopram (LEXAPRO) 20 MG tablet    Sig: Take 1 tablet (20 mg total) by mouth daily.    Dispense:  90 tablet    Refill:  1  . montelukast (SINGULAIR) 10 MG tablet    Sig: Take 1 tablet (10 mg total) by mouth daily.    Dispense:  90 tablet    Refill:  1  . triamterene-hydrochlorothiazide (MAXZIDE-25) 37.5-25 MG tablet    Sig: Take 1 tablet by mouth every morning.    Dispense:  90 tablet    Refill:  1  . albuterol (VENTOLIN HFA) 108 (90 Base) MCG/ACT inhaler    Sig: Inhale 2 puffs into the lungs every 6 (six) hours as needed. For wheezing    Dispense:  18 g    Refill:  2  . furosemide (LASIX) 20 MG tablet    Sig: Take 1 tablet (20 mg total) by mouth daily as needed for fluid or edema (weight gain>3# in 24 hours).    Dispense:  30  tablet    Refill:  3   I discussed the assessment and treatment plan with the patient. The patient was provided an opportunity to ask questions and all were answered. The patient agreed with the plan and demonstrated an understanding of the instructions.   The patient was advised to call back or seek an in-person evaluation if the symptoms worsen or if the condition fails to improve as anticipated.  I provided 25 minutes of non-face-to-face time during this encounter.  Penni Homans, MD

## 2020-01-01 NOTE — Assessment & Plan Note (Signed)
She is very sad over the loss of her mother on March 2. She feels the Escitalopram has been helpful. No changes.

## 2020-01-01 NOTE — Assessment & Plan Note (Signed)
Encouraged heart healthy diet, increase exercise, avoid trans fats, consider a krill oil cap daily 

## 2020-01-08 DIAGNOSIS — H524 Presbyopia: Secondary | ICD-10-CM | POA: Diagnosis not present

## 2020-01-08 DIAGNOSIS — H5203 Hypermetropia, bilateral: Secondary | ICD-10-CM | POA: Diagnosis not present

## 2020-01-08 DIAGNOSIS — H52223 Regular astigmatism, bilateral: Secondary | ICD-10-CM | POA: Diagnosis not present

## 2020-01-09 ENCOUNTER — Other Ambulatory Visit (INDEPENDENT_AMBULATORY_CARE_PROVIDER_SITE_OTHER): Payer: 59

## 2020-01-09 ENCOUNTER — Other Ambulatory Visit: Payer: Self-pay

## 2020-01-09 DIAGNOSIS — R739 Hyperglycemia, unspecified: Secondary | ICD-10-CM

## 2020-01-09 DIAGNOSIS — E785 Hyperlipidemia, unspecified: Secondary | ICD-10-CM | POA: Diagnosis not present

## 2020-01-09 DIAGNOSIS — I10 Essential (primary) hypertension: Secondary | ICD-10-CM

## 2020-01-09 LAB — LIPID PANEL
Cholesterol: 167 mg/dL (ref 0–200)
HDL: 55.4 mg/dL (ref >39.00)
LDL Cholesterol: 85 mg/dL (ref 0–99)
NonHDL: 111.7
Total CHOL/HDL Ratio: 3
Triglycerides: 136 mg/dL (ref 0.0–149.0)
VLDL: 27.2 mg/dL (ref 0.0–40.0)

## 2020-01-09 LAB — TSH: TSH: 1.36 u[IU]/mL (ref 0.35–4.50)

## 2020-01-09 LAB — CBC
HCT: 43.5 % (ref 36.0–46.0)
Hemoglobin: 14.5 g/dL (ref 12.0–15.0)
MCHC: 33.3 g/dL (ref 30.0–36.0)
MCV: 90 fl (ref 78.0–100.0)
Platelets: 321 10*3/uL (ref 150.0–400.0)
RBC: 4.83 Mil/uL (ref 3.87–5.11)
RDW: 12.8 % (ref 11.5–15.5)
WBC: 9.5 10*3/uL (ref 4.0–10.5)

## 2020-01-09 LAB — COMPREHENSIVE METABOLIC PANEL
ALT: 27 U/L (ref 0–35)
AST: 34 U/L (ref 0–37)
Albumin: 3.9 g/dL (ref 3.5–5.2)
Alkaline Phosphatase: 103 U/L (ref 39–117)
BUN: 12 mg/dL (ref 6–23)
CO2: 37 mEq/L — ABNORMAL HIGH (ref 19–32)
Calcium: 9.1 mg/dL (ref 8.4–10.5)
Chloride: 94 mEq/L — ABNORMAL LOW (ref 96–112)
Creatinine, Ser: 0.76 mg/dL (ref 0.40–1.20)
GFR: 78.56 mL/min (ref >60.00)
Glucose, Bld: 103 mg/dL — ABNORMAL HIGH (ref 70–99)
Potassium: 3.8 mEq/L (ref 3.5–5.1)
Sodium: 137 mEq/L (ref 135–145)
Total Bilirubin: 0.7 mg/dL (ref 0.2–1.2)
Total Protein: 7.3 g/dL (ref 6.0–8.3)

## 2020-01-09 LAB — HEMOGLOBIN A1C: Hgb A1c MFr Bld: 5.7 % (ref 4.6–6.5)

## 2020-01-10 ENCOUNTER — Other Ambulatory Visit: Payer: Self-pay | Admitting: *Deleted

## 2020-01-10 DIAGNOSIS — R7981 Abnormal blood-gas level: Secondary | ICD-10-CM

## 2020-01-15 ENCOUNTER — Other Ambulatory Visit: Payer: 59

## 2020-01-22 ENCOUNTER — Other Ambulatory Visit: Payer: 59

## 2020-02-18 ENCOUNTER — Ambulatory Visit (HOSPITAL_BASED_OUTPATIENT_CLINIC_OR_DEPARTMENT_OTHER): Payer: 59

## 2020-02-26 ENCOUNTER — Ambulatory Visit (HOSPITAL_BASED_OUTPATIENT_CLINIC_OR_DEPARTMENT_OTHER): Payer: 59

## 2020-03-17 ENCOUNTER — Telehealth: Payer: Self-pay | Admitting: Medical

## 2020-03-17 ENCOUNTER — Telehealth (INDEPENDENT_AMBULATORY_CARE_PROVIDER_SITE_OTHER): Payer: 59 | Admitting: Medical

## 2020-03-17 ENCOUNTER — Other Ambulatory Visit: Payer: Self-pay

## 2020-03-17 VITALS — BP 151/56 | HR 87 | Temp 95.5°F | Wt 341.0 lb

## 2020-03-17 DIAGNOSIS — J40 Bronchitis, not specified as acute or chronic: Secondary | ICD-10-CM | POA: Diagnosis not present

## 2020-03-17 MED ORDER — BENZONATATE 100 MG PO CAPS: 100.0000 mg | ORAL_CAPSULE | Freq: Three times a day (TID) | ORAL | 0 refills | Status: DC | PRN

## 2020-03-17 MED ORDER — BUDESONIDE-FORMOTEROL FUMARATE 80-4.5 MCG/ACT IN AERO: INHALATION_SPRAY | RESPIRATORY_TRACT | 0 refills | Status: AC

## 2020-03-17 MED ORDER — AZITHROMYCIN 250 MG PO TABS: ORAL_TABLET | ORAL | 0 refills | Status: DC

## 2020-03-17 MED FILL — AZITHROMYCIN 250 MG TABLET: 250 | 5 days supply | Qty: 6 | Fill #0

## 2020-03-17 MED FILL — SYMBICORT 80-4.5 MCG INH: 80-4.5 | 30 days supply | Qty: 10 | Fill #0

## 2020-03-17 MED FILL — BENZONATATE 100 MG CAPS: 100 | 10 days supply | Qty: 30 | Fill #0

## 2020-03-17 NOTE — Patient Instructions (Addendum)
For bronchitis with mild nasal congestion will rx azithromycin antibiotic, benzonatate for cough and flonase for nasal congestion.(pt declined flonase)  If signs/symptoms worsen or change despite tx notify us.  Symbicort refill in event any sob or wheezing then can start.  Follow up in 7-10 days or as needed

## 2020-03-17 NOTE — Progress Notes (Signed)
   Subjective:    Patient ID: Pamela Solis, female    DOB: 1961-06-27, 59 y.o.   MRN: [id number]  HPI  Pt visit started out virtual but decided would have her come to office tomorrow am for physical exam(she is picking up rx tomorrow at [facility]). Based on her medical history and recent symptoms best to see in person. Will get vitals as well.   History of Present Illness:  Pt states she is coughing a lot and is nasally congested. Feels more chest congestion more than nasal congestion. Symptoms started a couple of days ago. She has hx of bronchitis. No fever, no chills or sweats. Just a lot of coughing and bringing up sweats. Signs and symptoms for about one week.  Pt just got back from [place] for 5 days.   Observations/Objective:(saw in office today. Reviewed yesterday visit and explained) in office charge. Not virtual. General- no acute distress, pleasant, alert and oriented. Neck- no tracheal deviatiion. Lungs-cta. Heart- rrr Lower ext- faint pedal edema symmetric. Negative homans sign.  Assessment and Plan: For bronchitis with mild nasal congestion will rx azithromycin antibiotic, benzonatate for cough and flonase for nasal congestion.(pt declined flonase)  If signs/symptoms worsen or change despite tx notify us.  Follow up in 7-10 days or as needed  Esperanza Richters, PA-C   Time spent with patient today was 30 minutes which consisted of chart revdew, discussing diagnosis, work up treatment and documentation.  Follow Up Instructions:    Esperanza Richters, PA-C   Review of Systems  Constitutional: Negative for chills, fatigue and fever.  HENT: Positive for congestion. Negative for rhinorrhea, sinus pressure and sinus pain.   Respiratory: Positive for cough. Negative for chest tightness, shortness of breath and wheezing.        Chest congestion.  Cardiovascular: Negative for chest pain and palpitations.  Gastrointestinal: Negative for abdominal pain.  Musculoskeletal:  Negative for back pain.  Neurological: Negative for dizziness, facial asymmetry and headaches.  Hematological: Negative for adenopathy.  Psychiatric/Behavioral: Negative for behavioral problems.       Objective:   Physical Exam        Assessment & Plan:

## 2020-03-17 NOTE — Telephone Encounter (Signed)
Pt will pick up rx from [facility] tomorrow morning. I asked her to come up for vitals, o2 sat and get me as I want to do quick exam/listen to her lungs.

## 2020-03-18 ENCOUNTER — Encounter: Payer: Self-pay | Admitting: Medical

## 2020-04-09 ENCOUNTER — Ambulatory Visit: Payer: 59 | Admitting: Family Medicine

## 2020-05-14 ENCOUNTER — Ambulatory Visit: Payer: 59 | Admitting: Family Medicine

## 2020-05-14 ENCOUNTER — Encounter: Payer: Self-pay | Admitting: Family Medicine

## 2020-05-14 ENCOUNTER — Other Ambulatory Visit: Payer: Self-pay

## 2020-05-14 VITALS — BP 130/80 | HR 98 | Temp 97.9°F | Resp 20 | Ht 66.0 in | Wt 335.2 lb

## 2020-05-14 DIAGNOSIS — M25562 Pain in left knee: Secondary | ICD-10-CM | POA: Diagnosis not present

## 2020-05-14 MED ORDER — DICLOFENAC SODIUM 1 % EX GEL
4.0000 g | Freq: Four times a day (QID) | CUTANEOUS | 2 refills | Status: AC
Start: 2020-05-14 — End: ?

## 2020-05-14 MED FILL — DICLOFENAC SODIUM 1 % GEL: 1 | 6 days supply | Qty: 100 | Fill #0

## 2020-05-14 NOTE — Progress Notes (Signed)
Patient ID: Pamela Solis, female    DOB: 10/28/1960  Age: 59 y.o. MRN: [id number]    Subjective:  Subjective  HPI Pamela Solis presents for c/o L knee pain x 5 days.  She may have twisted it but she is not sure  She only has pain with weight bearing  Review of Systems  Constitutional: Negative for appetite change, diaphoresis, fatigue and unexpected weight change.  Eyes: Negative for pain, redness and visual disturbance.  Respiratory: Negative for cough, chest tightness, shortness of breath and wheezing.   Cardiovascular: Negative for chest pain, palpitations and leg swelling.  Endocrine: Negative for cold intolerance, heat intolerance, polydipsia, polyphagia and polyuria.  Genitourinary: Negative for difficulty urinating, dysuria and frequency.  Musculoskeletal: Positive for arthralgias and joint swelling.  Neurological: Negative for dizziness, light-headedness, numbness and headaches.    History Past Medical History:  Diagnosis Date  . Acute bronchitis 01/21/2016  . Allergic state 01/31/2017  . Allergy   . Anemia   . Asthma   . Cervical cancer screening 06/25/2015  . Chronic kidney disease   . Depression   . Edema 11/24/2014  . Hypertension   . Obesity   . Preventative health care 07/31/2016  . Snoring 11/24/2014  . Thoracic outlet syndrome 11/24/2014    She has a past surgical history that includes Cesarean section; Tubal ligation; and Endometrial ablation (2011).   Her family history includes Arthritis in her mother; Obesity in her mother; Stroke in her father.She reports that she has never smoked. She has never used smokeless tobacco. She reports that she does not drink alcohol and does not use drugs.  Current Outpatient Medications on File Prior to Visit  Medication Sig Dispense Refill  . albuterol (VENTOLIN HFA) 108 (90 Base) MCG/ACT inhaler Inhale 2 puffs into the lungs every 6 (six) hours as needed. For wheezing 18 g 2  . budesonide-formoterol (SYMBICORT) 80-4.5 MCG/ACT  inhaler INHALE 2 PUFFS BY MOUTH INTO THE LUNGS 2 (TWO) TIMES DAILY. BEGIN USING THIS INHALER ONCE YOU HAVE COMPLETED THE ORAL PREDNISONE DOSING SCHE 10.2 g 0  . cetirizine (ZYRTEC) 10 MG tablet TAKE 1 TABLET (10 MG TOTAL) BY MOUTH DAILY. 100 tablet 3  . escitalopram (LEXAPRO) 20 MG tablet Take 1 tablet (20 mg total) by mouth daily. 90 tablet 1  . furosemide (LASIX) 20 MG tablet Take 1 tablet (20 mg total) by mouth daily as needed for fluid or edema (weight gain>3# in 24 hours). 30 tablet 3  . ibuprofen (ADVIL,MOTRIN) 800 MG tablet Take 1 tablet (800 mg total) by mouth every 8 (eight) hours as needed. For migraine 270 tablet 0  . montelukast (SINGULAIR) 10 MG tablet Take 1 tablet (10 mg total) by mouth daily. 90 tablet 1  . triamterene-hydrochlorothiazide (MAXZIDE-25) 37.5-25 MG tablet Take 1 tablet by mouth every morning. 90 tablet 1   No current facility-administered medications on file prior to visit.     Objective:  Objective  Physical Exam Vitals and nursing note reviewed.  Constitutional:      Appearance: She is well-developed.  HENT:     Head: Normocephalic and atraumatic.  Eyes:     Conjunctiva/sclera: Conjunctivae normal.  Neck:     Thyroid: No thyromegaly.     Vascular: No carotid bruit or JVD.  Cardiovascular:     Rate and Rhythm: Normal rate and regular rhythm.     Heart sounds: Normal heart sounds. No murmur heard.   Pulmonary:     Effort: Pulmonary effort is normal.  No respiratory distress.     Breath sounds: Normal breath sounds. No wheezing or rales.  Chest:     Chest wall: No tenderness.  Musculoskeletal:        General: Swelling present. No tenderness.     Cervical back: Normal range of motion and neck supple.     Comments: No pain L knee with palpation  Only pain with weight bearing --- pain is in back of knee   Neurological:     Mental Status: She is alert and oriented to person, place, and time.    BP 130/80 (BP Location: Right Arm, Patient Position:  Sitting, Cuff Size: Large)   Pulse 98   Temp 97.9 F (36.6 C) (Oral)   Resp 20   Ht 5\' 6"  (1.676 m)   Wt (!) 335 lb 3.2 oz (152 kg)   SpO2 97%   BMI 54.10 kg/m  Wt Readings from Last 3 Encounters:  05/14/20 (!) 335 lb 3.2 oz (152 kg)  03/18/20 (!) 341 lb (154.7 kg)  01/24/12 294 lb 15.6 oz (133.8 kg)     Lab Results  Component Value Date   WBC 9.5 01/09/2020   HGB 14.5 01/09/2020   HCT 43.5 01/09/2020   PLT 321.0 01/09/2020   GLUCOSE 103 (H) 01/09/2020   CHOL 167 01/09/2020   TRIG 136.0 01/09/2020   HDL 55.40 01/09/2020   LDLCALC 85 01/09/2020   ALT 27 01/09/2020   AST 34 01/09/2020   NA 137 01/09/2020   K 3.8 01/09/2020   CL 94 (L) 01/09/2020   CREATININE 0.76 01/09/2020   BUN 12 01/09/2020   CO2 37 (H) 01/09/2020   TSH 1.36 01/09/2020   HGBA1C 5.7 01/09/2020    No results found.   Assessment & Plan:  Plan  I have discontinued Tonye Tancredi. Pflaum's azithromycin and benzonatate. I am also having her start on diclofenac Sodium. Additionally, I am having her maintain her ibuprofen, cetirizine, escitalopram, montelukast, triamterene-hydrochlorothiazide, albuterol, furosemide, and budesonide-formoterol.  Meds ordered this encounter  Medications  . diclofenac Sodium (VOLTAREN) 1 % GEL    Sig: Apply 4 g topically 4 (four) times daily.    Dispense:  150 g    Refill:  2    Problem List Items Addressed This Visit    None    Visit Diagnoses    Acute pain of left knee    -  Primary   Relevant Medications   diclofenac Sodium (VOLTAREN) 1 % GEL   Other Relevant Orders   Ambulatory referral to Sports Medicine   DG Knee Complete 4 Views Left    we did not have a sleeve that fit her We used an ace to give her some support and referred her to sport med   Follow-up: Return if symptoms worsen or fail to improve.  Nicholes Rough, DO

## 2020-05-14 NOTE — Patient Instructions (Signed)
Acute Knee Pain, Adult Acute knee pain is sudden and may be caused by damage, swelling, or irritation of the muscles and tissues that support your knee. The injury may result from:  A fall.  An injury to your knee from twisting motions.  A hit to the knee.  Infection. Acute knee pain may go away on its own with time and rest. If it does not, your health care provider may order tests to find the cause of the pain. These may include:  Imaging tests, such as an X-ray, MRI, or ultrasound.  Joint aspiration. In this test, fluid is removed from the knee.  Arthroscopy. In this test, a lighted tube is inserted into the knee and an image is projected onto a TV screen.  Biopsy. In this test, a sample of tissue is removed from the body and studied under a microscope. Follow these instructions at home: Pay attention to any changes in your symptoms. Take these actions to relieve your pain. If you have a knee sleeve or brace:   Wear the sleeve or brace as told by your health care provider. Remove it only as told by your health care provider.  Loosen the sleeve or brace if your toes tingle, become numb, or turn cold and blue.  Keep the sleeve or brace clean.  If the sleeve or brace is not waterproof: ? Do not let it get wet. ? Cover it with a watertight covering when you take a bath or shower. Activity  Rest your knee.  Do not do things that cause pain or make pain worse.  Avoid high-impact activities or exercises, such as running, jumping rope, or doing jumping jacks.  Work with a physical therapist to make a safe exercise program, as recommended by your health care provider. Do exercises as told by your physical therapist. Managing pain, stiffness, and swelling   If directed, put ice on the knee: ? Put ice in a plastic bag. ? Place a towel between your skin and the bag. ? Leave the ice on for 20 minutes, 2-3 times a day.  If directed, use an elastic bandage to put pressure  (compression) on your injured knee. This may control swelling, give support, and help with discomfort. General instructions  Take over-the-counter and prescription medicines only as told by your health care provider.  Raise (elevate) your knee above the level of your heart when you are sitting or lying down.  Sleep with a pillow under your knee.  Do not use any products that contain nicotine or tobacco, such as cigarettes, e-cigarettes, and chewing tobacco. These can delay healing. If you need help quitting, ask your health care provider.  If you are overweight, work with your health care provider and a dietitian to set a weight-loss goal that is healthy and reasonable for you. Extra weight can put pressure on your knee.  Keep all follow-up visits as told by your health care provider. This is important. Contact a health care provider if:  Your knee pain continues, changes, or gets worse.  You have a fever along with knee pain.  Your knee feels warm to the touch.  Your knee buckles or locks up. Get help right away if:  Your knee swells, and the swelling becomes worse.  You cannot move your knee.  You have severe pain in your knee. Summary  Acute knee pain can be caused by a fall, an injury, an infection, or damage, swelling, or irritation of the tissues that support your knee.    Your health care provider may perform tests to find out the cause of the pain.  Pay attention to any changes in your symptoms. Relieve your pain with rest, medicines, light activity, and use of ice.  Get help if your pain continues or becomes worse, your knee swells, or you cannot move your knee. This information is not intended to replace advice given to you by your health care provider. Make sure you discuss any questions you have with your health care provider. Document Revised: 02/22/2018 Document Reviewed: 02/22/2018 Elsevier Patient Education  2020 [employer]  

## 2020-05-26 ENCOUNTER — Ambulatory Visit (INDEPENDENT_AMBULATORY_CARE_PROVIDER_SITE_OTHER): Payer: 59

## 2020-05-26 ENCOUNTER — Ambulatory Visit (INDEPENDENT_AMBULATORY_CARE_PROVIDER_SITE_OTHER): Payer: 59 | Admitting: Sports Medicine

## 2020-05-26 ENCOUNTER — Other Ambulatory Visit: Payer: Self-pay

## 2020-05-26 DIAGNOSIS — M1711 Unilateral primary osteoarthritis, right knee: Secondary | ICD-10-CM | POA: Diagnosis not present

## 2020-05-26 DIAGNOSIS — M25562 Pain in left knee: Secondary | ICD-10-CM | POA: Diagnosis not present

## 2020-05-26 NOTE — Progress Notes (Signed)
    Procedures performed today:    None.  Independent interpretation of notes and tests performed by another provider:   None.  Brief History, Exam, Impression, and Recommendations:    Arthur Aydelotte is a pleasant 59yo female who presents today with left knee pain. She noticed the pain about 3 weeks ago. There was no acute injury to the knee. She went to her PCP who referred her to [place]. The pain is mainly around the anteromedial portion of the knee. She does complain of some locking and popping sensations in the knee. The knee has continued to improve over the past couple of weeks with icy hot. She initially was using a cane to walk, but does not feel the need to use it anymore. Xrays did show some medial joint space narrowing and mild spurring. Her pain is most likely due to OA of the knee. We discussed the normal progression of OA and what therapies are available. We discussed weight loss strategies including Wegovy and the health and wellness center. At the moment she will think about this interventions and discuss them with her PCP. She feels comfortable at the moment to continue conservatively with diclofenac gel applied to the joint. We have also given her at home exercises to do for knee strengthening. We will see her on an as needed basis.  Aurelio Jew, MS3   ___________________________________________ Ihor Austin. Benjamin Stain, M.D., ABFM., CAQSM. Primary Care and Sports Medicine [facility]  Adjunct Instructor of Family Medicine  [employer]

## 2020-05-26 NOTE — Patient Instructions (Signed)
Look into [facility] as the medicine, we can also do a referral to the healthy weight and wellness center for physician directed weight loss.

## 2020-05-26 NOTE — Assessment & Plan Note (Signed)
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 For some time now she is had pain in her left knee, medial joint line, no mechanical symptoms, mild swelling, she is taking diclofenac, doing some conditioning, and these seem to be helping appropriately. We did discuss the following steps including steroid injection followed by viscosupplementation if insufficient improvement. I did personally review her x-rays, she does have very mild medial joint space narrowing consistent with osteoarthritis, considering that she still has significant cartilage in her knee it is crucial that we preserve this, and the best way that she can do this right now is by losing a lot of weight. I did offer referral to [facility] surgery bariatrics, she declines, she can certainly visit the healthy weight and wellness center, and consider medication such as Wegovy, she is going to look into this. Her follow-up with me can be on an as-needed basis, and she can ask either myself or her PCP for Wegovy/healthy weight and wellness center referrals.

## 2020-06-04 MED FILL — MONTELUKAST SOD 10 MG TAB: 10 | 90 days supply | Qty: 90 | Fill #1

## 2020-06-04 MED FILL — TRIAMTERENE-HCTZ 37.5-25 MG: 37.5-25 | 90 days supply | Qty: 90 | Fill #1

## 2020-07-06 MED FILL — CETIRIZINE HCL 10 MG TABS: 10 | 100 days supply | Qty: 100 | Fill #0

## 2020-07-06 MED FILL — ESCITALOPRAM 20 MG TABLET: 20 | 90 days supply | Qty: 90 | Fill #1

## 2020-09-08 ENCOUNTER — Ambulatory Visit: Payer: 59 | Admitting: Family Medicine

## 2020-10-22 ENCOUNTER — Other Ambulatory Visit: Payer: Self-pay | Admitting: Family Medicine

## 2020-10-22 MED FILL — TRIAMTERENE-HCTZ 37.5-25 MG: 37.5-25 | 90 days supply | Qty: 90 | Fill #0

## 2020-10-22 MED FILL — MONTELUKAST SOD 10 MG TAB: 10 | 90 days supply | Qty: 90 | Fill #0

## 2020-10-27 ENCOUNTER — Other Ambulatory Visit (HOSPITAL_BASED_OUTPATIENT_CLINIC_OR_DEPARTMENT_OTHER): Payer: Self-pay | Admitting: Internal Medicine

## 2020-10-27 ENCOUNTER — Ambulatory Visit: Payer: 59 | Attending: Internal Medicine

## 2020-10-27 DIAGNOSIS — Z23 Encounter for immunization: Secondary | ICD-10-CM

## 2020-10-27 MED FILL — PFIZER-BIONTECH COVID-19 VA: 30 | 21 days supply | Qty: 0 | Fill #0

## 2020-10-27 NOTE — Progress Notes (Signed)
   Covid-19 Vaccination Clinic  Name:  Pamela Solis    MRN: [id number] DOB: September 12, 1961  10/27/2020  [name] was observed post Covid-19 immunization for 15 minutes without incident. She was provided with Vaccine Information Sheet and instruction to access the V-Safe system. Vaccinated by [employer].  [name] was instructed to call 911 with any severe reactions post vaccine: Marland Kitchen Difficulty breathing  . Swelling of face and throat  . A fast heartbeat  . A bad rash all over body  . Dizziness and weakness   Immunizations Administered    Name Date Dose VIS Date Route   PFIZER Comrnaty(Gray TOP) Covid-19 Vaccine 10/27/2020  1:50 PM 0.3 mL 09/03/2020 Intramuscular   Manufacturer: [employer], [employer]   Lot: [id number]   NDC: [phone]

## 2020-11-24 ENCOUNTER — Telehealth (INDEPENDENT_AMBULATORY_CARE_PROVIDER_SITE_OTHER): Payer: 59 | Admitting: Family Medicine

## 2020-11-24 ENCOUNTER — Other Ambulatory Visit: Payer: Self-pay | Admitting: Family Medicine

## 2020-11-24 ENCOUNTER — Other Ambulatory Visit: Payer: Self-pay

## 2020-11-24 DIAGNOSIS — E785 Hyperlipidemia, unspecified: Secondary | ICD-10-CM | POA: Diagnosis not present

## 2020-11-24 DIAGNOSIS — R739 Hyperglycemia, unspecified: Secondary | ICD-10-CM

## 2020-11-24 DIAGNOSIS — F32A Depression, unspecified: Secondary | ICD-10-CM

## 2020-11-24 DIAGNOSIS — T7840XD Allergy, unspecified, subsequent encounter: Secondary | ICD-10-CM | POA: Diagnosis not present

## 2020-11-24 DIAGNOSIS — I10 Essential (primary) hypertension: Secondary | ICD-10-CM

## 2020-11-24 MED ORDER — ALPRAZOLAM 0.25 MG PO TABS: 0.1250 mg | ORAL_TABLET | Freq: Two times a day (BID) | ORAL | 1 refills | Status: DC | PRN

## 2020-11-24 MED ORDER — SERTRALINE HCL 100 MG PO TABS: 100.0000 mg | ORAL_TABLET | Freq: Every day | ORAL | 3 refills | Status: DC

## 2020-11-24 MED FILL — SERTRALINE HCL 100 MG TABS: 100 | 30 days supply | Qty: 30 | Fill #0

## 2020-11-24 MED FILL — ALPRAZolam 0.25 MG TABS: 0.25 | 10 days supply | Qty: 20 | Fill #0

## 2020-11-29 NOTE — Assessment & Plan Note (Signed)
Encouraged heart healthy diet, increase exercise, avoid trans fats, consider a krill oil cap daily 

## 2020-11-29 NOTE — Assessment & Plan Note (Addendum)
She lost her mother to COVID in a nursing home in [place] 2 years ago. She is stressed by the separation . She notes she has also been more anxious. Big crowds and noisy spaces cause her significant distress presently. Sertraline 100 mg daily and Alprazolam 0.25 mg tabs prn

## 2020-11-29 NOTE — Progress Notes (Signed)
Patient ID: Pamela Solis, female   DOB: September 09, 1961, 60 y.o.   MRN: [id number] Virtual Visit via Video Note  I connected with [name] on 3/1/22at 10:00 AM EST by a video enabled telemedicine application and verified that I am speaking with the correct person using two identifiers.  Location: Patient: home, patient and provider in visit Provider: home   I discussed the limitations of evaluation and management by telemedicine and the availability of in person appointments. The patient expressed understanding and agreed to proceed. S Chism, CMA was able to get the patient set up on a video visit    Subjective:    Patient ID: Pamela Solis, female    DOB: 03/14/1961, 60 y.o.   MRN: [id number]  Chief Complaint  Patient presents with  . Follow-up  . Hyperlipidemia    HPI Patient is in today for follow up on chronic medical concerns. She is struggling with increased anxiety and mild depression. Has been worse with going back to the office and her mother  Denies CP/palp/SOB/HA/congestion/fevers/GI or GU c/o. Taking meds as prescribed  Past Medical History:  Diagnosis Date  . Acute bronchitis 01/21/2016  . Allergic state 01/31/2017  . Allergy   . Anemia   . Asthma   . Cervical cancer screening 06/25/2015  . Chronic kidney disease   . Depression   . Edema 11/24/2014  . Hypertension   . Obesity   . Preventative health care 07/31/2016  . Snoring 11/24/2014  . Thoracic outlet syndrome 11/24/2014    Past Surgical History:  Procedure Laterality Date  . CESAREAN SECTION    . ENDOMETRIAL ABLATION  2011  . TUBAL LIGATION      Family History  Problem Relation Age of Onset  . Obesity Mother   . Arthritis Mother        rheumatoid  . Stroke Father     Social History   Socioeconomic History  . Marital status: Married    Spouse name: Not on file  . Number of children: Not on file  . Years of education: Not on file  . Highest education level: Not on file  Occupational History  .  Not on file  Tobacco Use  . Smoking status: Never Smoker  . Smokeless tobacco: Never Used  Substance and Sexual Activity  . Alcohol use: No  . Drug use: No  . Sexual activity: Not on file    Comment: lives with husband and daughters, works for [employer] no dietary restrictions  Other Topics Concern  . Not on file  Social History Narrative  . Not on file   Social Determinants of Health   Financial Resource Strain: Not on file  Food Insecurity: Not on file  Transportation Needs: Not on file  Physical Activity: Not on file  Stress: Not on file  Social Connections: Not on file  Intimate Partner Violence: Not on file    Outpatient Medications Prior to Visit  Medication Sig Dispense Refill  . albuterol (VENTOLIN HFA) 108 (90 Base) MCG/ACT inhaler Inhale 2 puffs into the lungs every 6 (six) hours as needed. For wheezing 18 g 2  . budesonide-formoterol (SYMBICORT) 80-4.5 MCG/ACT inhaler INHALE 2 PUFFS BY MOUTH INTO THE LUNGS 2 (TWO) TIMES DAILY. BEGIN USING THIS INHALER ONCE YOU HAVE COMPLETED THE ORAL PREDNISONE DOSING SCHE 10.2 g 0  . cetirizine (ZYRTEC) 10 MG tablet TAKE 1 TABLET (10 MG TOTAL) BY MOUTH DAILY. 100 tablet 3  . diclofenac Sodium (VOLTAREN) 1 % GEL Apply  4 g topically 4 (four) times daily. 150 g 2  . furosemide (LASIX) 20 MG tablet Take 1 tablet (20 mg total) by mouth daily as needed for fluid or edema (weight gain>3# in 24 hours). 30 tablet 3  . ibuprofen (ADVIL,MOTRIN) 800 MG tablet Take 1 tablet (800 mg total) by mouth every 8 (eight) hours as needed. For migraine 270 tablet 0  . montelukast (SINGULAIR) 10 MG tablet TAKE 1 TABLET (10 MG TOTAL) BY MOUTH DAILY. 90 tablet 0  . triamterene-hydrochlorothiazide (MAXZIDE-25) 37.5-25 MG tablet TAKE 1 TABLET BY MOUTH EVERY MORNING. 90 tablet 0  . escitalopram (LEXAPRO) 20 MG tablet Take 1 tablet (20 mg total) by mouth daily. 90 tablet 1   No facility-administered medications prior to visit.    Allergies  Allergen Reactions  .  Penicillins Rash    Review of Systems  Constitutional: Negative for fever and malaise/fatigue.  HENT: Negative for congestion.   Eyes: Negative for blurred vision.  Respiratory: Negative for shortness of breath.   Cardiovascular: Negative for chest pain, palpitations and leg swelling.  Gastrointestinal: Negative for abdominal pain, blood in stool and nausea.  Genitourinary: Negative for dysuria and frequency.  Musculoskeletal: Negative for falls.  Skin: Negative for rash.  Neurological: Negative for dizziness, loss of consciousness and headaches.  Endo/Heme/Allergies: Negative for environmental allergies.  Psychiatric/Behavioral: Negative for depression. The patient is nervous/anxious.        Objective:    Physical Exam Constitutional:      Appearance: Normal appearance. She is not ill-appearing.  HENT:     Head: Normocephalic and atraumatic.     Right Ear: There is no impacted cerumen.     Left Ear: There is no impacted cerumen.     Nose: Nose normal.  Eyes:     General:        Right eye: No discharge.        Left eye: No discharge.  Pulmonary:     Effort: Pulmonary effort is normal.  Neurological:     Mental Status: She is alert and oriented to person, place, and time.  Psychiatric:        Behavior: Behavior normal.     There were no vitals taken for this visit. Wt Readings from Last 3 Encounters:  05/14/20 (!) 335 lb 3.2 oz (152 kg)  03/18/20 (!) 341 lb (154.7 kg)  01/24/12 294 lb 15.6 oz (133.8 kg)    Diabetic Foot Exam - Simple   No data filed    Lab Results  Component Value Date   WBC 9.5 01/09/2020   HGB 14.5 01/09/2020   HCT 43.5 01/09/2020   PLT 321.0 01/09/2020   GLUCOSE 103 (H) 01/09/2020   CHOL 167 01/09/2020   TRIG 136.0 01/09/2020   HDL 55.40 01/09/2020   LDLCALC 85 01/09/2020   ALT 27 01/09/2020   AST 34 01/09/2020   NA 137 01/09/2020   K 3.8 01/09/2020   CL 94 (L) 01/09/2020   CREATININE 0.76 01/09/2020   BUN 12 01/09/2020   CO2  37 (H) 01/09/2020   TSH 1.36 01/09/2020   HGBA1C 5.7 01/09/2020    Lab Results  Component Value Date   TSH 1.36 01/09/2020   Lab Results  Component Value Date   WBC 9.5 01/09/2020   HGB 14.5 01/09/2020   HCT 43.5 01/09/2020   MCV 90.0 01/09/2020   PLT 321.0 01/09/2020   Lab Results  Component Value Date   NA 137 01/09/2020   K 3.8 01/09/2020  CO2 37 (H) 01/09/2020   GLUCOSE 103 (H) 01/09/2020   BUN 12 01/09/2020   CREATININE 0.76 01/09/2020   BILITOT 0.7 01/09/2020   ALKPHOS 103 01/09/2020   AST 34 01/09/2020   ALT 27 01/09/2020   PROT 7.3 01/09/2020   ALBUMIN 3.9 01/09/2020   CALCIUM 9.1 01/09/2020   GFR 78.56 01/09/2020   Lab Results  Component Value Date   CHOL 167 01/09/2020   Lab Results  Component Value Date   HDL 55.40 01/09/2020   Lab Results  Component Value Date   LDLCALC 85 01/09/2020   Lab Results  Component Value Date   TRIG 136.0 01/09/2020   Lab Results  Component Value Date   CHOLHDL 3 01/09/2020   Lab Results  Component Value Date   HGBA1C 5.7 01/09/2020       Assessment & Plan:   Problem List Items Addressed This Visit    HTN (hypertension) - Primary    Well controlled, no changes to meds. Encouraged heart healthy diet such as the DASH diet and exercise as tolerated.       Relevant Orders   CBC   Comprehensive metabolic panel   TSH   Depression    She lost her mother to COVID in a nursing home in [place] 2 years ago. She is stressed by the separation . She notes she has also been more anxious. Big crowds and noisy spaces cause her significant distress presently. Sertraline 100 mg daily and Alprazolam 0.25 mg tabs prn      Relevant Medications   sertraline (ZOLOFT) 100 MG tablet   ALPRAZolam (XANAX) 0.25 MG tablet   Allergy   Hyperglycemia    hgba1c acceptable, minimize simple carbs. Increase exercise as tolerated.       Relevant Orders   Hemoglobin A1c   Hyperlipidemia    Encouraged heart healthy diet, increase  exercise, avoid trans fats, consider a krill oil cap daily      Relevant Orders   Lipid panel      I have discontinued Nicholes Rough. Handyside's escitalopram. I am also having her start on sertraline and ALPRAZolam. Additionally, I am having her maintain her ibuprofen, cetirizine, albuterol, furosemide, budesonide-formoterol, diclofenac Sodium, triamterene-hydrochlorothiazide, and montelukast.  Meds ordered this encounter  Medications  . sertraline (ZOLOFT) 100 MG tablet    Sig: Take 1 tablet (100 mg total) by mouth daily.    Dispense:  30 tablet    Refill:  3  . ALPRAZolam (XANAX) 0.25 MG tablet    Sig: Take 0.5-1 tablets (0.125-0.25 mg total) by mouth 2 (two) times daily as needed for anxiety.    Dispense:  20 tablet    Refill:  1    I discussed the assessment and treatment plan with the patient. The patient was provided an opportunity to ask questions and all were answered. The patient agreed with the plan and demonstrated an understanding of the instructions.   The patient was advised to call back or seek an in-person evaluation if the symptoms worsen or if the condition fails to improve as anticipated.  I provided 20 minutes of non-face-to-face time during this encounter.   [name], MD

## 2020-11-29 NOTE — Assessment & Plan Note (Signed)
Well controlled, no changes to meds. Encouraged heart healthy diet such as the DASH diet and exercise as tolerated.  °

## 2020-11-29 NOTE — Assessment & Plan Note (Signed)
hgba1c acceptable, minimize simple carbs. Increase exercise as tolerated.  

## 2020-12-01 ENCOUNTER — Other Ambulatory Visit: Payer: Self-pay

## 2020-12-01 ENCOUNTER — Other Ambulatory Visit (INDEPENDENT_AMBULATORY_CARE_PROVIDER_SITE_OTHER): Payer: 59

## 2020-12-01 DIAGNOSIS — I10 Essential (primary) hypertension: Secondary | ICD-10-CM

## 2020-12-01 DIAGNOSIS — R739 Hyperglycemia, unspecified: Secondary | ICD-10-CM | POA: Diagnosis not present

## 2020-12-01 DIAGNOSIS — E785 Hyperlipidemia, unspecified: Secondary | ICD-10-CM

## 2020-12-02 LAB — LIPID PANEL
Cholesterol: 146 mg/dL (ref 0–200)
HDL: 53.1 mg/dL (ref >39.00)
LDL Cholesterol: 71 mg/dL (ref 0–99)
NonHDL: 92.96
Total CHOL/HDL Ratio: 3
Triglycerides: 108 mg/dL (ref 0.0–149.0)
VLDL: 21.6 mg/dL (ref 0.0–40.0)

## 2020-12-02 LAB — CBC
HCT: 42.5 % (ref 36.0–46.0)
Hemoglobin: 14.4 g/dL (ref 12.0–15.0)
MCHC: 33.8 g/dL (ref 30.0–36.0)
MCV: 89.3 fl (ref 78.0–100.0)
Platelets: 344 10*3/uL (ref 150.0–400.0)
RBC: 4.76 Mil/uL (ref 3.87–5.11)
RDW: 12.4 % (ref 11.5–15.5)
WBC: 8.2 10*3/uL (ref 4.0–10.5)

## 2020-12-02 LAB — COMPREHENSIVE METABOLIC PANEL
ALT: 19 U/L (ref 0–35)
AST: 18 U/L (ref 0–37)
Albumin: 3.6 g/dL (ref 3.5–5.2)
Alkaline Phosphatase: 116 U/L (ref 39–117)
BUN: 12 mg/dL (ref 6–23)
CO2: 31 mEq/L (ref 19–32)
Calcium: 9.2 mg/dL (ref 8.4–10.5)
Chloride: 100 mEq/L (ref 96–112)
Creatinine, Ser: 0.74 mg/dL (ref 0.40–1.20)
GFR: 89.78 mL/min (ref >60.00)
Glucose, Bld: 95 mg/dL (ref 70–99)
Potassium: 3.9 mEq/L (ref 3.5–5.1)
Sodium: 138 mEq/L (ref 135–145)
Total Bilirubin: 0.4 mg/dL (ref 0.2–1.2)
Total Protein: 7.2 g/dL (ref 6.0–8.3)

## 2020-12-02 LAB — HEMOGLOBIN A1C: Hgb A1c MFr Bld: 5.6 % (ref 4.6–6.5)

## 2020-12-02 LAB — TSH: TSH: 1.08 u[IU]/mL (ref 0.35–4.50)

## 2021-01-19 ENCOUNTER — Ambulatory Visit: Payer: 59 | Admitting: Family Medicine

## 2021-03-02 ENCOUNTER — Encounter: Payer: Self-pay | Admitting: Family Medicine

## 2021-03-02 ENCOUNTER — Ambulatory Visit: Payer: 59 | Admitting: Family Medicine

## 2021-03-02 ENCOUNTER — Other Ambulatory Visit: Payer: Self-pay

## 2021-03-02 VITALS — BP 132/88 | HR 110 | Temp 98.1°F | Resp 20 | Wt 338.0 lb

## 2021-03-02 DIAGNOSIS — E669 Obesity, unspecified: Secondary | ICD-10-CM | POA: Diagnosis not present

## 2021-03-02 DIAGNOSIS — Z1239 Encounter for other screening for malignant neoplasm of breast: Secondary | ICD-10-CM

## 2021-03-02 DIAGNOSIS — F32A Depression, unspecified: Secondary | ICD-10-CM

## 2021-03-02 DIAGNOSIS — K625 Hemorrhage of anus and rectum: Secondary | ICD-10-CM

## 2021-03-02 DIAGNOSIS — E785 Hyperlipidemia, unspecified: Secondary | ICD-10-CM

## 2021-03-02 DIAGNOSIS — Z23 Encounter for immunization: Secondary | ICD-10-CM

## 2021-03-02 DIAGNOSIS — I10 Essential (primary) hypertension: Secondary | ICD-10-CM

## 2021-03-02 DIAGNOSIS — Z1211 Encounter for screening for malignant neoplasm of colon: Secondary | ICD-10-CM | POA: Diagnosis not present

## 2021-03-02 DIAGNOSIS — Z124 Encounter for screening for malignant neoplasm of cervix: Secondary | ICD-10-CM

## 2021-03-02 DIAGNOSIS — R739 Hyperglycemia, unspecified: Secondary | ICD-10-CM

## 2021-03-02 NOTE — Progress Notes (Signed)
Patient ID: Pamela Solis, female    DOB: 1961-07-22  Age: 60 y.o. MRN: [id number]    Subjective:  Subjective  HPI Pamela Solis presents for office visit today for follow up on depression and HTN. She states that she has no recent sicknesses or recent ER visits to report. She reports that she is doing ok and states that some days for her are better than others. She denies trying counseling, although she states that she have not thought about it. She denies any chest pain, SOB, fever, abdominal pain, cough, chills, sore throat, dysuria, urinary incontinence, back pain, HA, or N/VD. She states that she has had chicken pox at the age of 38 y/o or younger. She expresses interest in getting a colonoscopy due to recent bleeding in stool that occurs intermittently and due to her mother having a history of polyps. She reports that the bleeding has started 6 months ago and the frequency is weekly. She states that usually there is only blood when wiping with tissue and rarely blood gets mixed with stool or bowel.   Review of Systems  Constitutional: Negative for chills, fatigue and fever.  HENT: Negative for congestion, rhinorrhea, sinus pressure, sinus pain and sore throat.   Eyes: Negative for pain.  Respiratory: Negative for cough and shortness of breath.   Cardiovascular: Negative for chest pain, palpitations and leg swelling.  Gastrointestinal: Positive for blood in stool. Negative for abdominal pain, diarrhea, nausea and vomiting.  Genitourinary: Negative for decreased urine volume, flank pain, frequency, vaginal bleeding and vaginal discharge.  Musculoskeletal: Negative for back pain.  Neurological: Negative for headaches.    History Past Medical History:  Diagnosis Date  . Acute bronchitis 01/21/2016  . Allergic state 01/31/2017  . Allergy   . Anemia   . Asthma   . Cervical cancer screening 06/25/2015  . Chronic kidney disease   . Depression   . Edema 11/24/2014  . Hypertension   . Obesity    . Preventative health care 07/31/2016  . Snoring 11/24/2014  . Thoracic outlet syndrome 11/24/2014    She has a past surgical history that includes Cesarean section; Tubal ligation; and Endometrial ablation (2011).   Her family history includes Arthritis in her mother; Obesity in her mother; Stroke in her father.She reports that she has never smoked. She has never used smokeless tobacco. She reports that she does not drink alcohol and does not use drugs.  Current Outpatient Medications on File Prior to Visit  Medication Sig Dispense Refill  . albuterol (VENTOLIN HFA) 108 (90 Base) MCG/ACT inhaler Inhale 2 puffs into the lungs every 6 (six) hours as needed. For wheezing 18 g 2  . ALPRAZolam (XANAX) 0.25 MG tablet TAKE 1/2 - 1 TABLETS BY MOUTH 2 TIMES DAILY AS NEEDED FOR ANXIETY 20 tablet 1  . budesonide-formoterol (SYMBICORT) 80-4.5 MCG/ACT inhaler INHALE 2 PUFFS BY MOUTH INTO THE LUNGS 2 (TWO) TIMES DAILY. BEGIN USING THIS INHALER ONCE YOU HAVE COMPLETED THE ORAL PREDNISONE DOSING SCHE 10.2 g 0  . cetirizine (ZYRTEC) 10 MG tablet TAKE 1 TABLET (10 MG TOTAL) BY MOUTH DAILY. 100 tablet 3  . COVID-19 mRNA vaccine, Pfizer, 30 MCG/0.3ML injection INJECT AS DIRECTED .3 mL 0  . diclofenac Sodium (VOLTAREN) 1 % GEL Apply 4 g topically 4 (four) times daily. 150 g 2  . furosemide (LASIX) 20 MG tablet Take 1 tablet (20 mg total) by mouth daily as needed for fluid or edema (weight gain>3# in 24 hours). 30 tablet 3  .  ibuprofen (ADVIL,MOTRIN) 800 MG tablet Take 1 tablet (800 mg total) by mouth every 8 (eight) hours as needed. For migraine 270 tablet 0  . montelukast (SINGULAIR) 10 MG tablet TAKE 1 TABLET (10 MG TOTAL) BY MOUTH DAILY. 90 tablet 0  . sertraline (ZOLOFT) 100 MG tablet TAKE 1 TABLET (100 MG TOTAL) BY MOUTH DAILY. 30 tablet 3  . triamterene-hydrochlorothiazide (MAXZIDE-25) 37.5-25 MG tablet TAKE 1 TABLET BY MOUTH EVERY MORNING. 90 tablet 0  . [DISCONTINUED] escitalopram (LEXAPRO) 20 MG tablet  Take 1 tablet (20 mg total) by mouth daily. 90 tablet 1   No current facility-administered medications on file prior to visit.     Objective:  Objective  Physical Exam Constitutional:      General: She is not in acute distress.    Appearance: Normal appearance. She is not ill-appearing or toxic-appearing.  HENT:     Head: Normocephalic and atraumatic.     Right Ear: Tympanic membrane, ear canal and external ear normal.     Left Ear: Tympanic membrane, ear canal and external ear normal.     Nose: No congestion or rhinorrhea.  Eyes:     Extraocular Movements: Extraocular movements intact.     Pupils: Pupils are equal, round, and reactive to light.  Cardiovascular:     Rate and Rhythm: Normal rate and regular rhythm.     Pulses: Normal pulses.     Heart sounds: Normal heart sounds. No murmur heard.   Pulmonary:     Effort: Pulmonary effort is normal. No respiratory distress.     Breath sounds: Normal breath sounds. No wheezing, rhonchi or rales.  Abdominal:     General: Bowel sounds are normal.     Palpations: Abdomen is soft. There is no mass.     Tenderness: There is no abdominal tenderness. There is no guarding.     Hernia: No hernia is present.  Musculoskeletal:        General: Normal range of motion.     Cervical back: Normal range of motion and neck supple.  Skin:    General: Skin is warm and dry.  Neurological:     Mental Status: She is alert and oriented to person, place, and time.  Psychiatric:        Behavior: Behavior normal.    BP 132/88   Pulse (!) 110   Temp 98.1 F (36.7 C)   Resp 20   Wt (!) 338 lb (153.3 kg)   SpO2 93%   BMI 54.55 kg/m  Wt Readings from Last 3 Encounters:  03/02/21 (!) 338 lb (153.3 kg)  05/14/20 (!) 335 lb 3.2 oz (152 kg)  03/18/20 (!) 341 lb (154.7 kg)     Lab Results  Component Value Date   WBC 8.2 12/01/2020   HGB 14.4 12/01/2020   HCT 42.5 12/01/2020   PLT 344.0 12/01/2020   GLUCOSE 95 12/01/2020   CHOL 146  12/01/2020   TRIG 108.0 12/01/2020   HDL 53.10 12/01/2020   LDLCALC 71 12/01/2020   ALT 19 12/01/2020   AST 18 12/01/2020   NA 138 12/01/2020   K 3.9 12/01/2020   CL 100 12/01/2020   CREATININE 0.74 12/01/2020   BUN 12 12/01/2020   CO2 31 12/01/2020   TSH 1.08 12/01/2020   HGBA1C 5.6 12/01/2020    No results found.   Assessment & Plan:  Plan   No orders of the defined types were placed in this encounter.   Problem List Items Addressed This  Visit    HTN (hypertension)    Well controlled, no changes to meds. Encouraged heart healthy diet such as the DASH diet and exercise as tolerated.       Depression    She does feel better with the transition To sertraline. Misunderstood and took 1/2 sertraline and 1/2 previous SSSRI til now. Will switch to all Sertraline 100 mg daily and reassess      Obesity    Encouraged DASH or MIND diet, decrease po intake and increase exercise as tolerated. Needs 7-8 hours of sleep nightly. Avoid trans fats, eat small, frequent meals every 4-5 hours with lean proteins, complex carbs and healthy fats. Minimize simple carbs, high fat foods and processed foods      Cervical cancer screening    Pap at next visit, MGM ordered, colonoscopy referral placed      Hyperglycemia    hgba1c acceptable, minimize simple carbs. Increase exercise as tolerated.       Hyperlipidemia    Encouraged heart healthy diet, increase exercise, avoid trans fats, consider a krill oil cap daily      BRBPR (bright red blood per rectum)    Roughly weekly episodes of BRBPR on tissue only rarely in bowl. Usually after straining. Referred to gsatroenterology for definitive evaluation      Relevant Orders   Ambulatory referral to Gastroenterology    Other Visit Diagnoses    Encounter for screening for malignant neoplasm of breast, unspecified screening modality    -  Primary   Relevant Orders   MM 3D SCREEN BREAST BILATERAL   Colon cancer screening       Relevant  Orders   Ambulatory referral to Gastroenterology      Follow-up: Return in about 3 months (around 06/02/2021), or 3 mn f/u then 6-7 CPE with gyn.   I,David Hanna,acting as a scribe for [name], MD.,have documented all relevant documentation on the behalf of [name], MD,as directed by  [name], MD while in the presence of [name], MD.  I, Bradd Canary, MD personally performed the services described in this documentation. All medical record entries made by the scribe were at my direction and in my presence. I have reviewed the chart and agree that the record reflects my personal performance and is accurate and complete

## 2021-03-02 NOTE — Assessment & Plan Note (Signed)
Encouraged DASH or MIND diet, decrease po intake and increase exercise as tolerated. Needs 7-8 hours of sleep nightly. Avoid trans fats, eat small, frequent meals every 4-5 hours with lean proteins, complex carbs and healthy fats. Minimize simple carbs, high fat foods and processed foods 

## 2021-03-02 NOTE — Assessment & Plan Note (Signed)
hgba1c acceptable, minimize simple carbs. Increase exercise as tolerated.  

## 2021-03-02 NOTE — Addendum Note (Signed)
Addended by: Calton Golds A on: 03/02/2021 10:01 AM   Modules accepted: Orders

## 2021-03-02 NOTE — Assessment & Plan Note (Addendum)
She does feel better with the transition To sertraline. Misunderstood and took 1/2 sertraline and 1/2 previous SSSRI til now. Will switch to all Sertraline 100 mg daily and reassess

## 2021-03-02 NOTE — Assessment & Plan Note (Signed)
Well controlled, no changes to meds. Encouraged heart healthy diet such as the DASH diet and exercise as tolerated.  °

## 2021-03-02 NOTE — Assessment & Plan Note (Signed)
Roughly weekly episodes of BRBPR on tissue only rarely in bowl. Usually after straining. Referred to gsatroenterology for definitive evaluation

## 2021-03-02 NOTE — Assessment & Plan Note (Signed)
Pap at next visit, MGM ordered, colonoscopy referral placed

## 2021-03-02 NOTE — Patient Instructions (Addendum)
Shingrix is the new shingles shot, 2 shots over 2-6 months, confirm coverage with insurance and document, then can return here for shots with nurse appt or at pharmacy  [facility] or [facility] for weight loss.  Encouraged DASH or MIND diet, decrease po intake and increase exercise as tolerated. Needs 7-8 hours of sleep nightly. Avoid trans fats, eat small, frequent meals every 4-5 hours with lean proteins, complex carbs and healthy fats. Minimize simple carbs, high fat foods and processed foods  DASH Eating Plan DASH stands for Dietary Approaches to Stop Hypertension. The DASH eating plan is a healthy eating plan that has been shown to:  Reduce high blood pressure (hypertension).  Reduce your risk for type 2 diabetes, heart disease, and stroke.  Help with weight loss. What are tips for following this plan? Reading food labels  Check food labels for the amount of salt (sodium) per serving. Choose foods with less than 5 percent of the Daily Value of sodium. Generally, foods with less than 300 milligrams (mg) of sodium per serving fit into this eating plan.  To find whole grains, look for the word "whole" as the first word in the ingredient list. Shopping  Buy products labeled as "low-sodium" or "no salt added."  Buy fresh foods. Avoid canned foods and pre-made or frozen meals. Cooking  Avoid adding salt when cooking. Use salt-free seasonings or herbs instead of table salt or sea salt. Check with your health care provider or pharmacist before using salt substitutes.  Do not fry foods. Cook foods using healthy methods such as baking, boiling, grilling, roasting, and broiling instead.  Cook with heart-healthy oils, such as olive, canola, avocado, soybean, or sunflower oil. Meal planning  Eat a balanced diet that includes: ? 4 or more servings of fruits and 4 or more servings of vegetables each day. Try to fill one-half of your plate with fruits and vegetables. ? 6-8 servings of whole grains  each day. ? Less than 6 oz (170 g) of lean meat, poultry, or fish each day. A 3-oz (85-g) serving of meat is about the same size as a deck of cards. One egg equals 1 oz (28 g). ? 2-3 servings of low-fat dairy each day. One serving is 1 cup (237 mL). ? 1 serving of nuts, seeds, or beans 5 times each week. ? 2-3 servings of heart-healthy fats. Healthy fats called omega-3 fatty acids are found in foods such as walnuts, flaxseeds, fortified milks, and eggs. These fats are also found in cold-water fish, such as sardines, salmon, and mackerel.  Limit how much you eat of: ? Canned or prepackaged foods. ? Food that is high in trans fat, such as some fried foods. ? Food that is high in saturated fat, such as fatty meat. ? Desserts and other sweets, sugary drinks, and other foods with added sugar. ? Full-fat dairy products.  Do not salt foods before eating.  Do not eat more than 4 egg yolks a week.  Try to eat at least 2 vegetarian meals a week.  Eat more home-cooked food and less restaurant, buffet, and fast food.   Lifestyle  When eating at a restaurant, ask that your food be prepared with less salt or no salt, if possible.  If you drink alcohol: ? Limit how much you use to:  0-1 drink a day for women who are not pregnant.  0-2 drinks a day for men. ? Be aware of how much alcohol is in your drink. In the [place] one  drink equals one 12 oz bottle of beer (355 mL), one 5 oz glass of wine (148 mL), or one 1 oz glass of hard liquor (44 mL). General information  Avoid eating more than 2,300 mg of salt a day. If you have hypertension, you may need to reduce your sodium intake to 1,500 mg a day.  Work with your health care provider to maintain a healthy body weight or to lose weight. Ask what an ideal weight is for you.  Get at least 30 minutes of exercise that causes your heart to beat faster (aerobic exercise) most days of the week. Activities may include walking, swimming, or biking.  Work  with your health care provider or dietitian to adjust your eating plan to your individual calorie needs. What foods should I eat? Fruits All fresh, dried, or frozen fruit. Canned fruit in natural juice (without added sugar). Vegetables Fresh or frozen vegetables (raw, steamed, roasted, or grilled). Low-sodium or reduced-sodium tomato and vegetable juice. Low-sodium or reduced-sodium tomato sauce and tomato paste. Low-sodium or reduced-sodium canned vegetables. Grains Whole-grain or whole-wheat bread. Whole-grain or whole-wheat pasta. Brown rice. Orpah Cobb. Bulgur. Whole-grain and low-sodium cereals. Pita bread. Low-fat, low-sodium crackers. Whole-wheat flour tortillas. Meats and other proteins Skinless chicken or [place]. Ground chicken or [place]. Pork with fat trimmed off. Fish and seafood. Egg whites. Dried beans, peas, or lentils. Unsalted nuts, nut butters, and seeds. Unsalted canned beans. Lean cuts of beef with fat trimmed off. Low-sodium, lean precooked or cured meat, such as sausages or meat loaves. Dairy Low-fat (1%) or fat-free (skim) milk. Reduced-fat, low-fat, or fat-free cheeses. Nonfat, low-sodium ricotta or cottage cheese. Low-fat or nonfat yogurt. Low-fat, low-sodium cheese. Fats and oils Soft margarine without trans fats. Vegetable oil. Reduced-fat, low-fat, or light mayonnaise and salad dressings (reduced-sodium). Canola, safflower, olive, avocado, soybean, and sunflower oils. Avocado. Seasonings and condiments Herbs. Spices. Seasoning mixes without salt. Other foods Unsalted popcorn and pretzels. Fat-free sweets. The items listed above may not be a complete list of foods and beverages you can eat. Contact a dietitian for more information. What foods should I avoid? Fruits Canned fruit in a light or heavy syrup. Fried fruit. Fruit in cream or butter sauce. Vegetables Creamed or fried vegetables. Vegetables in a cheese sauce. Regular canned vegetables (not low-sodium or  reduced-sodium). Regular canned tomato sauce and paste (not low-sodium or reduced-sodium). Regular tomato and vegetable juice (not low-sodium or reduced-sodium). Rosita Fire. Olives. Grains Baked goods made with fat, such as croissants, muffins, or some breads. Dry pasta or rice meal packs. Meats and other proteins Fatty cuts of meat. Ribs. Fried meat. Tomasa Blase. Bologna, salami, and other precooked or cured meats, such as sausages or meat loaves. Fat from the back of a pig (fatback). Bratwurst. Salted nuts and seeds. Canned beans with added salt. Canned or smoked fish. Whole eggs or egg yolks. Chicken or [place] with skin. Dairy Whole or 2% milk, cream, and half-and-half. Whole or full-fat cream cheese. Whole-fat or sweetened yogurt. Full-fat cheese. Nondairy creamers. Whipped toppings. Processed cheese and cheese spreads. Fats and oils Butter. Stick margarine. Lard. Shortening. Ghee. Bacon fat. Tropical oils, such as coconut, palm kernel, or palm oil. Seasonings and condiments Onion salt, garlic salt, seasoned salt, table salt, and sea salt. Worcestershire sauce. Tartar sauce. Barbecue sauce. Teriyaki sauce. Soy sauce, including reduced-sodium. Steak sauce. Canned and packaged gravies. Fish sauce. Oyster sauce. Cocktail sauce. Store-bought horseradish. Ketchup. Mustard. Meat flavorings and tenderizers. Bouillon cubes. Hot sauces. Pre-made or packaged marinades. Pre-made or  packaged taco seasonings. Relishes. Regular salad dressings. Other foods Salted popcorn and pretzels. The items listed above may not be a complete list of foods and beverages you should avoid. Contact a dietitian for more information. Where to find more information  National Heart, Lung, and Blood Institute: PopSteam.is  American Heart Association: www.heart.org  Academy of Nutrition and Dietetics: www.eatright.org  National Kidney Foundation: www.kidney.org Summary  The DASH eating plan is a healthy eating plan that has  been shown to reduce high blood pressure (hypertension). It may also reduce your risk for type 2 diabetes, heart disease, and stroke.  When on the DASH eating plan, aim to eat more fresh fruits and vegetables, whole grains, lean proteins, low-fat dairy, and heart-healthy fats.  With the DASH eating plan, you should limit salt (sodium) intake to 2,300 mg a day. If you have hypertension, you may need to reduce your sodium intake to 1,500 mg a day.  Work with your health care provider or dietitian to adjust your eating plan to your individual calorie needs. This information is not intended to replace advice given to you by your health care provider. Make sure you discuss any questions you have with your health care provider. Document Revised: 08/16/2019 Document Reviewed: 08/16/2019 Elsevier Patient Education  2021 [employer].

## 2021-03-02 NOTE — Assessment & Plan Note (Signed)
Encouraged heart healthy diet, increase exercise, avoid trans fats, consider a krill oil cap daily 

## 2021-03-05 ENCOUNTER — Telehealth: Payer: Self-pay | Admitting: Family Medicine

## 2021-03-05 NOTE — Telephone Encounter (Signed)
Patient states she had her shingle vaccination few days ago.Marland Kitchen She would like to speak to someone in regards to it. Patient would not give me any further explanation.

## 2021-03-08 NOTE — Telephone Encounter (Signed)
Pt is aware of instruction and she states that she will remember this information next time use it.

## 2021-03-09 ENCOUNTER — Telehealth: Payer: 59 | Admitting: Family Medicine

## 2021-03-09 ENCOUNTER — Other Ambulatory Visit: Payer: Self-pay

## 2021-03-30 ENCOUNTER — Inpatient Hospital Stay (HOSPITAL_BASED_OUTPATIENT_CLINIC_OR_DEPARTMENT_OTHER): Admission: RE | Admit: 2021-03-30 | Payer: 59 | Source: Ambulatory Visit

## 2021-04-13 ENCOUNTER — Ambulatory Visit (HOSPITAL_BASED_OUTPATIENT_CLINIC_OR_DEPARTMENT_OTHER): Payer: 59

## 2021-04-13 ENCOUNTER — Encounter: Payer: 59 | Admitting: Family Medicine

## 2021-04-21 IMAGING — DX DG KNEE COMPLETE 4+V*L*
4 series · 4 of 4 positions shown · non-contrast
Comparison: None.

CLINICAL DATA: 3wks of lt knee pain. Feels "pulling" in back of
knee.

EXAM:
LEFT KNEE - COMPLETE 4+ VIEW

[knee ap]
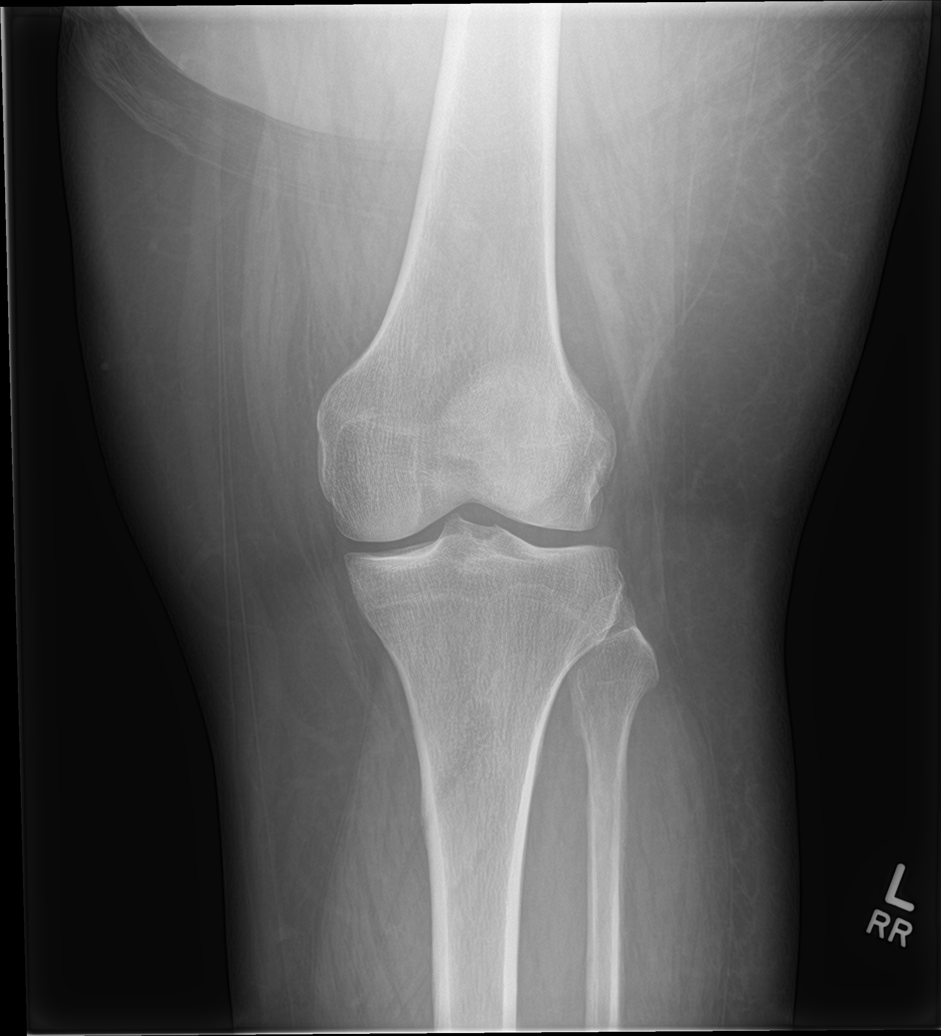

[knee lat]
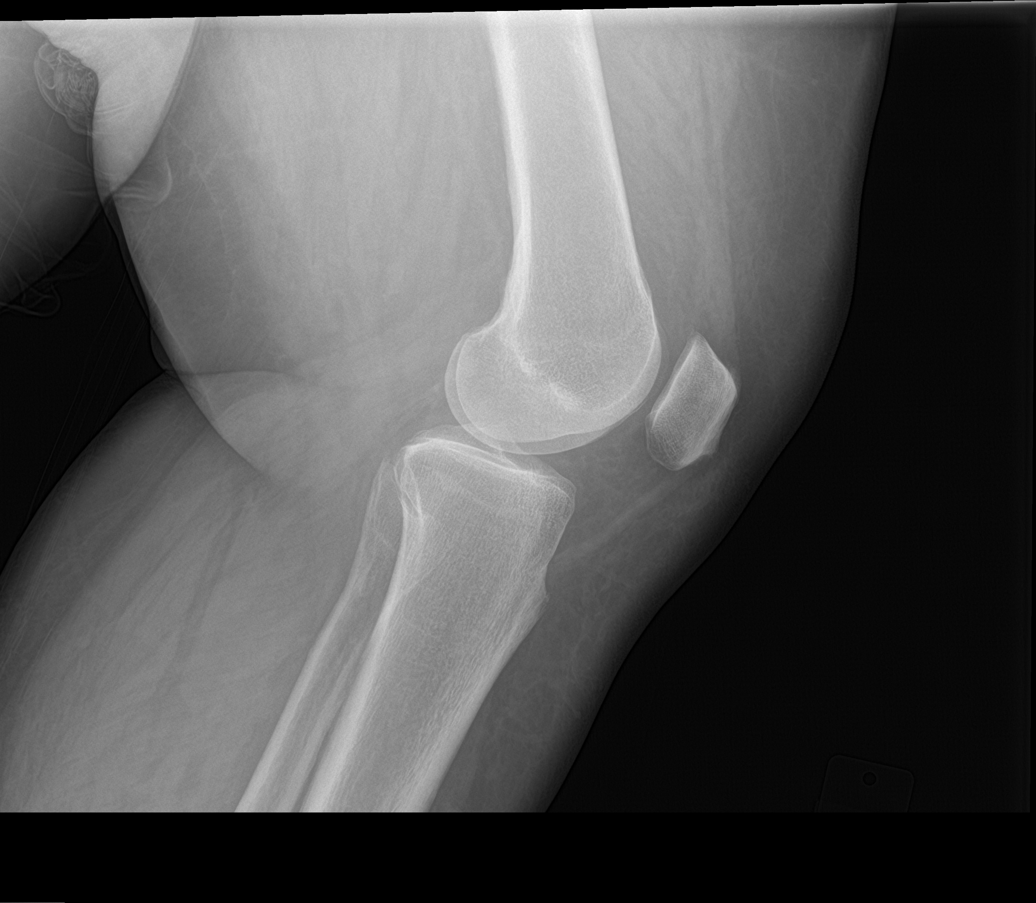

[knee obl (1 of 2)]
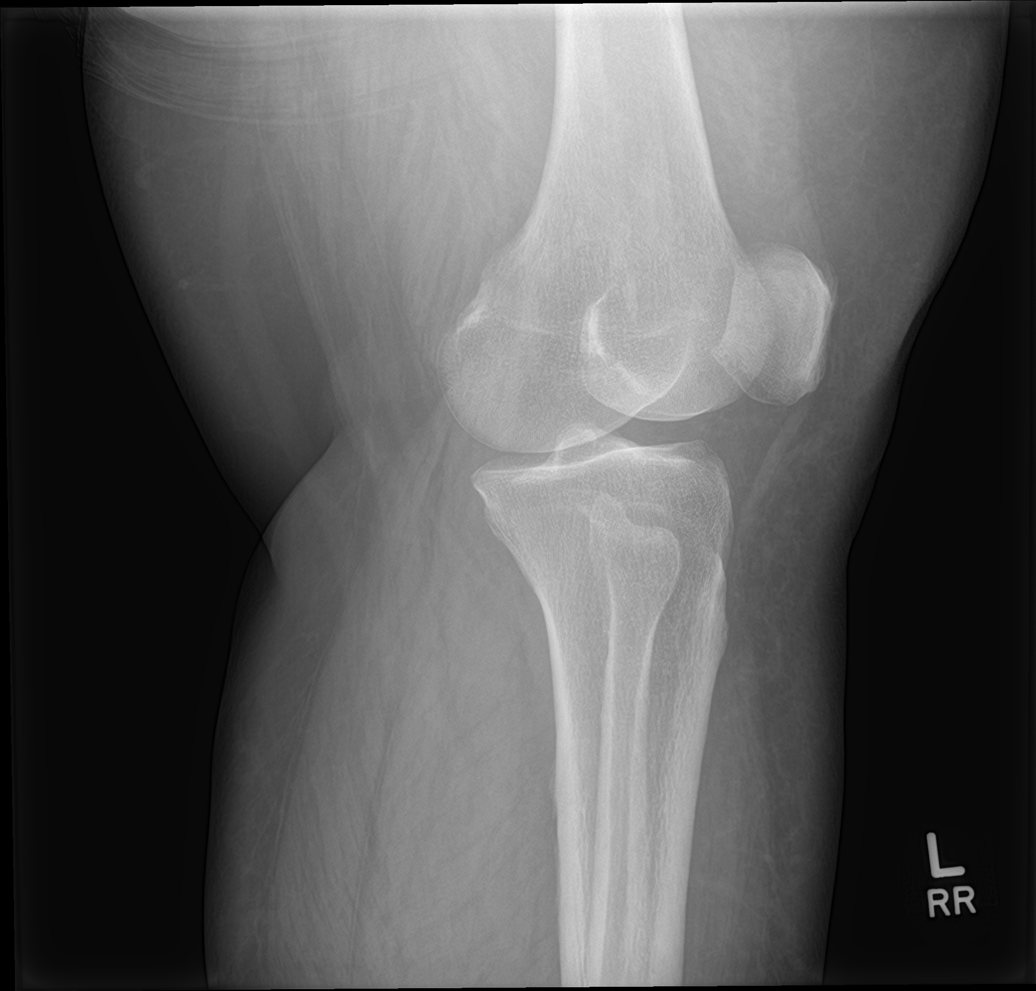

[knee obl (2 of 2)]
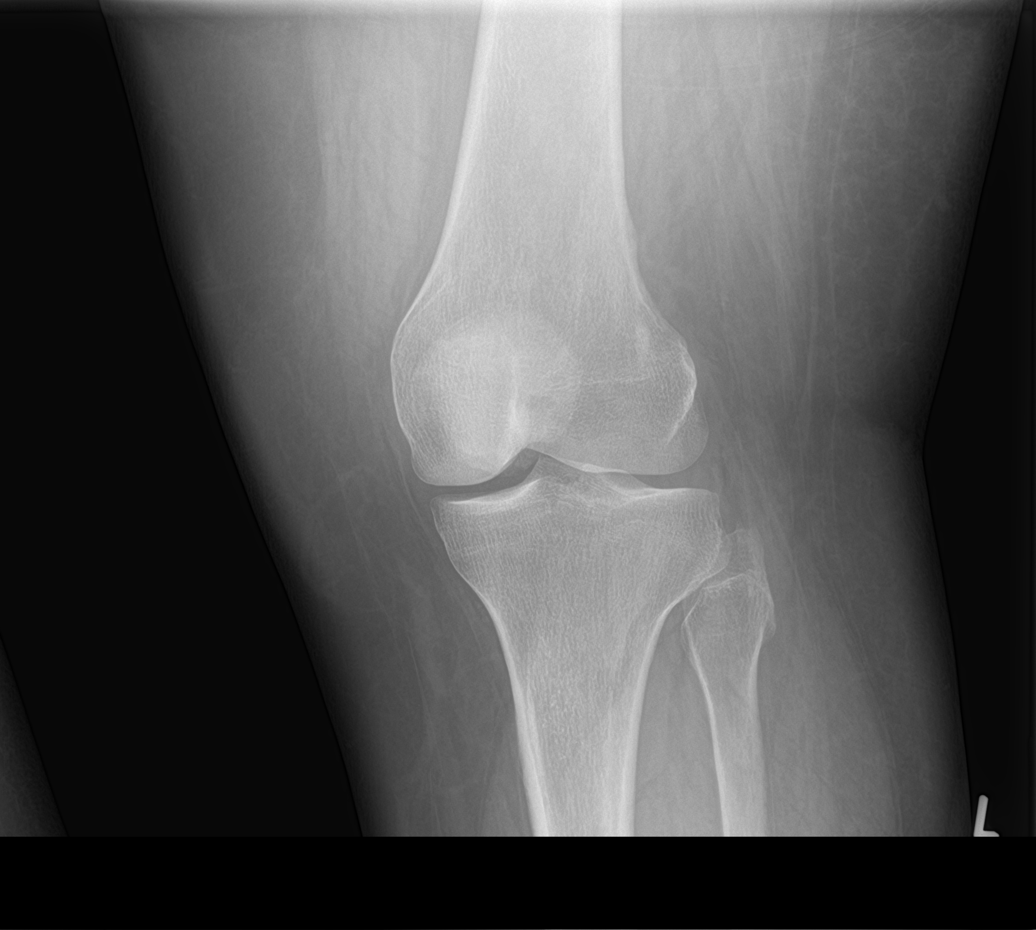

[4 of 4 positions shown; findings below may reference images not displayed]

FINDINGS: No evidence of fracture, dislocation, or joint effusion. No evidence
of arthropathy or other focal bone abnormality. Soft tissues are
unremarkable.
IMPRESSION: Negative left knee radiographs.

## 2021-04-27 ENCOUNTER — Telehealth: Payer: Self-pay | Admitting: Family Medicine

## 2021-04-27 NOTE — Telephone Encounter (Signed)
Document faxed in to be filled out by provider [facility] - 6 pages) Document put at front office tray under provider name.

## 2021-04-29 NOTE — Telephone Encounter (Signed)
Patient notified that Dr. Abner Greenspan will not be back until next Tuesday and that she will need to call and get an extension.  Patient stated that this is form migraines and the last couple of visits in the last 6 months has no mention about it.  May need office visit, pt advised.  Pt stated Dr. Abner Greenspan can call her if needed.

## 2021-04-29 NOTE — Telephone Encounter (Signed)
Paperwork placed in yellow bin for provider to review and sign. 

## 2021-05-05 ENCOUNTER — Telehealth: Payer: Self-pay | Admitting: Family Medicine

## 2021-05-05 NOTE — Telephone Encounter (Signed)
Duplicate paper work.  See previous message.

## 2021-05-05 NOTE — Telephone Encounter (Signed)
Document faxed to our office to be filled out ([employer] 6 pages- FMLA paperwork) Document put at front office tray under providers name.

## 2021-05-07 NOTE — Telephone Encounter (Signed)
Per Dr. Abner Greenspan VV needed to fill out paperwork.  Patient scheduled for 05/13/21 at 820am.  Advised patient to call for an extension to get paperwork turned back in.

## 2021-05-10 ENCOUNTER — Other Ambulatory Visit: Payer: Self-pay | Admitting: Family Medicine

## 2021-05-10 ENCOUNTER — Other Ambulatory Visit (HOSPITAL_BASED_OUTPATIENT_CLINIC_OR_DEPARTMENT_OTHER): Payer: Self-pay

## 2021-05-10 MED ORDER — MONTELUKAST SODIUM 10 MG PO TABS
ORAL_TABLET | Freq: Every day | ORAL | 0 refills | Status: DC
  Filled 2021-05-10: qty 90, 90d supply, fill #0

## 2021-05-10 MED ORDER — TRIAMTERENE-HCTZ 37.5-25 MG PO TABS
1.0000 | ORAL_TABLET | Freq: Every morning | ORAL | 0 refills | Status: DC
  Filled 2021-05-10: qty 90, 90d supply, fill #0

## 2021-05-10 MED FILL — Alprazolam Tab 0.25 MG: ORAL | 10 days supply | Qty: 20 | Fill #0 | Status: AC

## 2021-05-10 MED FILL — Sertraline HCl Tab 100 MG: ORAL | 30 days supply | Qty: 30 | Fill #0 | Status: AC

## 2021-05-11 ENCOUNTER — Ambulatory Visit: Payer: 59 | Admitting: Family Medicine

## 2021-05-11 ENCOUNTER — Encounter: Payer: Self-pay | Admitting: Family Medicine

## 2021-05-11 ENCOUNTER — Ambulatory Visit (HOSPITAL_BASED_OUTPATIENT_CLINIC_OR_DEPARTMENT_OTHER)
Admission: RE | Admit: 2021-05-11 | Discharge: 2021-05-11 | Disposition: A | Discharge status: Home / Self Care | Payer: 59 | Source: Ambulatory Visit | Attending: Family Medicine | Admitting: Family Medicine

## 2021-05-11 ENCOUNTER — Other Ambulatory Visit: Payer: Self-pay

## 2021-05-11 ENCOUNTER — Other Ambulatory Visit (HOSPITAL_BASED_OUTPATIENT_CLINIC_OR_DEPARTMENT_OTHER): Payer: Self-pay

## 2021-05-11 ENCOUNTER — Encounter (HOSPITAL_BASED_OUTPATIENT_CLINIC_OR_DEPARTMENT_OTHER): Payer: Self-pay

## 2021-05-11 VITALS — BP 128/72 | HR 84 | Temp 98.1°F | Ht 65.0 in | Wt 337.4 lb

## 2021-05-11 DIAGNOSIS — Z1231 Encounter for screening mammogram for malignant neoplasm of breast: Secondary | ICD-10-CM | POA: Insufficient documentation

## 2021-05-11 DIAGNOSIS — M25562 Pain in left knee: Secondary | ICD-10-CM | POA: Diagnosis not present

## 2021-05-11 DIAGNOSIS — Z1239 Encounter for other screening for malignant neoplasm of breast: Secondary | ICD-10-CM

## 2021-05-11 MED ORDER — MELOXICAM 15 MG PO TABS
15.0000 mg | ORAL_TABLET | Freq: Every day | ORAL | 0 refills | Status: DC
  Filled 2021-05-11: qty 30, 30d supply, fill #0

## 2021-05-11 NOTE — Patient Instructions (Addendum)
Ice/cold pack over area for 10-15 min twice daily.  OK to take Tylenol 1000 mg (2 extra strength tabs) or 975 mg (3 regular strength tabs) every 6 hours as needed.  Elevate the leg.  Avoid NSAIDs while on the prescription medication.   This is not cellulitis.   Let us know if you need anything.

## 2021-05-11 NOTE — Progress Notes (Signed)
Musculoskeletal Exam  Patient: Pamela Solis DOB: [birth year]  DOS: 05/11/2021  SUBJECTIVE:  Chief Complaint:   Chief Complaint  Patient presents with   Leg Injury    Left leg injury due to a fall. Fell 10 days ago Redness and swelling.    Pamela Solis is a 60 y.o.  female for evaluation and treatment of LLE pain.   Onset:  10 days ago. Slipped and LLE/knee   Location: L antero-medial knee, radiating down shin Character:  sharp and throbbing  Severity: 4/10 Progression of issue:  is unchanged Associated symptoms: swelling, bruising Denies: Fevers, spreading redness Treatment: to date has been: none.   Neurovascular symptoms: no  Past Medical History:  Diagnosis Date   Acute bronchitis 01/21/2016   Allergic state 01/31/2017   Allergy    Anemia    Asthma    Cervical cancer screening 06/25/2015   Chronic kidney disease    Depression    Edema 11/24/2014   Hypertension    Obesity    Preventative health care 07/31/2016   Snoring 11/24/2014   Thoracic outlet syndrome 11/24/2014    Objective: VITAL SIGNS: BP 128/72   Pulse 84   Temp 98.1 F (36.7 C) (Oral)   Ht 5\' 5"  (1.651 m)   Wt (!) 337 lb 6 oz (153 kg)   SpO2 95%   BMI 56.14 kg/m  Constitutional: Well formed, well developed. No acute distress. Skin: See below. No excessive warmth noted Thorax & Lungs: No accessory muscle use Musculoskeletal: LLE.   Normal active range of motion: yes.   Normal passive range of motion: yes Tenderness to palpation: yes over anterior medial prox tibia Deformity: no Ecchymosis: yes Edema noted 2+ pitting tapering at knee; no calf ttp Neurologic: Normal sensory function. No focal deficits noted. Gait antalgic Psychiatric: Normal mood. Age appropriate judgment and insight. Alert & oriented x 3.     LLE  Assessment:  Acute pain of left knee - Plan: meloxicam (MOBIC) 15 MG tablet  Plan: Stretches/exercises, Mobic, ice, Tylenol. No evidence of cellulitis. Elevate leg.   F/u  prn. The patient voiced understanding and agreement to the plan.   [place], DO 05/11/21  3:42 PM

## 2021-05-13 ENCOUNTER — Other Ambulatory Visit: Payer: Self-pay

## 2021-05-13 ENCOUNTER — Telehealth (INDEPENDENT_AMBULATORY_CARE_PROVIDER_SITE_OTHER): Payer: 59 | Admitting: Family Medicine

## 2021-05-13 ENCOUNTER — Other Ambulatory Visit (HOSPITAL_BASED_OUTPATIENT_CLINIC_OR_DEPARTMENT_OTHER): Payer: Self-pay

## 2021-05-13 DIAGNOSIS — I10 Essential (primary) hypertension: Secondary | ICD-10-CM

## 2021-05-13 DIAGNOSIS — G43909 Migraine, unspecified, not intractable, without status migrainosus: Secondary | ICD-10-CM | POA: Diagnosis not present

## 2021-05-13 DIAGNOSIS — R739 Hyperglycemia, unspecified: Secondary | ICD-10-CM | POA: Diagnosis not present

## 2021-05-13 MED ORDER — SUMATRIPTAN SUCCINATE 50 MG PO TABS
50.0000 mg | ORAL_TABLET | Freq: Once | ORAL | 1 refills | Status: DC
  Filled 2021-05-13: qty 9, 30d supply, fill #0

## 2021-05-13 NOTE — Telephone Encounter (Signed)
FMLA faxed to [employer] , copy placed in blue folder and copy mailed to patient

## 2021-05-13 NOTE — Progress Notes (Signed)
MyChart Video Visit    Virtual Visit via Video Note   This visit type was conducted due to national recommendations for restrictions regarding the COVID-19 Pandemic (e.g. social distancing) in an effort to limit this patient's exposure and mitigate transmission in our community. This patient is at least at moderate risk for complications without adequate follow up. This format is felt to be most appropriate for this patient at this time. Physical exam was limited by quality of the video and audio technology used for the visit. S chism, CMA was able to get the patient set up on a video visit.  Patient location: home Patient and provider in visit Provider location: Office  I discussed the limitations of evaluation and management by telemedicine and the availability of in person appointments. The patient expressed understanding and agreed to proceed.  Visit Date: 05/13/2021  Today's healthcare provider: Danise EdgeStacey Afsheen Antony, MD     Subjective:    Patient ID: Pamela Solis, female    DOB: 03/17/1961, 60 y.o.   MRN: [id number]  Chief Complaint  Patient presents with   FMLA paperwork    HPI  Patient is in today for a virtual visit for follow up on migraines and FMLA paperwork. She denies any recent hospitalizations, but reports having to call out from work due to migraines. She reports that the migraine episodes have been frequent last month but have been occurring since January. She states that she had to call out for 8 days due to migraines and was told to fill out FMLA paperwork to avoid trouble at work. She endorses having light and sound sensitivity, nausea, and vomiting. However, the light sensitivity is rare and vomiting only occurs when nausea is bad enough. She states that the migraines have been progressively getting worse, however taking Tylenol 650 BID does alleviate some pain. She expresses interest for getting a prescription for Imitrex to take PRN. She reports that an episode  usually lasts for 2 days and occurs every 2 weeks. Denies CP/palp/SOB/congestion/fevers/GI or GU c/o. Taking meds as prescribed.  Denies any calf pain regarding her recent fall and injuring right knee.   Past Medical History:  Diagnosis Date   Acute bronchitis 01/21/2016   Allergic state 01/31/2017   Allergy    Anemia    Asthma    Cervical cancer screening 06/25/2015   Chronic kidney disease    Depression    Edema 11/24/2014   Hypertension    Obesity    Preventative health care 07/31/2016   Snoring 11/24/2014   Thoracic outlet syndrome 11/24/2014    Past Surgical History:  Procedure Laterality Date   CESAREAN SECTION     ENDOMETRIAL ABLATION  2011   TUBAL LIGATION      Family History  Problem Relation Age of Onset   Obesity Mother    Arthritis Mother        rheumatoid   Stroke Father     Social History   Socioeconomic History   Marital status: Married    Spouse name: Not on file   Number of children: Not on file   Years of education: Not on file   Highest education level: Not on file  Occupational History   Not on file  Tobacco Use   Smoking status: Never   Smokeless tobacco: Never  Substance and Sexual Activity   Alcohol use: No   Drug use: No   Sexual activity: Not on file    Comment: lives with husband and daughters, works  for [employer] no dietary restrictions  Other Topics Concern   Not on file  Social History Narrative   Not on file   Social Determinants of Health   Financial Resource Strain: Not on file  Food Insecurity: Not on file  Transportation Needs: Not on file  Physical Activity: Not on file  Stress: Not on file  Social Connections: Not on file  Intimate Partner Violence: Not on file    Outpatient Medications Prior to Visit  Medication Sig Dispense Refill   albuterol (VENTOLIN HFA) 108 (90 Base) MCG/ACT inhaler Inhale 2 puffs into the lungs every 6 (six) hours as needed. For wheezing 18 g 2   ALPRAZolam (XANAX) 0.25 MG tablet TAKE 1/2 - 1  TABLETS BY MOUTH 2 TIMES DAILY AS NEEDED FOR ANXIETY 20 tablet 1   budesonide-formoterol (SYMBICORT) 80-4.5 MCG/ACT inhaler INHALE 2 PUFFS BY MOUTH INTO THE LUNGS 2 (TWO) TIMES DAILY. BEGIN USING THIS INHALER ONCE YOU HAVE COMPLETED THE ORAL PREDNISONE DOSING SCHE 10.2 g 0   cetirizine (ZYRTEC) 10 MG tablet TAKE 1 TABLET (10 MG TOTAL) BY MOUTH DAILY. 100 tablet 3   COVID-19 mRNA vaccine, Pfizer, 30 MCG/0.3ML injection INJECT AS DIRECTED .3 mL 0   diclofenac Sodium (VOLTAREN) 1 % GEL Apply 4 g topically 4 (four) times daily. 150 g 2   furosemide (LASIX) 20 MG tablet Take 1 tablet (20 mg total) by mouth daily as needed for fluid or edema (weight gain>3# in 24 hours). 30 tablet 3   ibuprofen (ADVIL,MOTRIN) 800 MG tablet Take 1 tablet (800 mg total) by mouth every 8 (eight) hours as needed. For migraine 270 tablet 0   meloxicam (MOBIC) 15 MG tablet Take 1 tablet (15 mg total) by mouth daily. 30 tablet 0   montelukast (SINGULAIR) 10 MG tablet TAKE 1 TABLET (10 MG TOTAL) BY MOUTH DAILY. 90 tablet 0   sertraline (ZOLOFT) 100 MG tablet TAKE 1 TABLET (100 MG TOTAL) BY MOUTH DAILY. 30 tablet 3   triamterene-hydrochlorothiazide (MAXZIDE-25) 37.5-25 MG tablet TAKE 1 TABLET BY MOUTH EVERY MORNING. 90 tablet 0   No facility-administered medications prior to visit.    Allergies  Allergen Reactions   Penicillins Rash    Review of Systems  Constitutional:  Negative for chills, fever and malaise/fatigue.  HENT:  Negative for congestion, sinus pain and sore throat.   Eyes:  Positive for photophobia. Negative for blurred vision.       (+) visual disturbance  Respiratory:  Negative for cough and shortness of breath.   Cardiovascular:  Negative for chest pain, palpitations and leg swelling.  Gastrointestinal:  Positive for nausea and vomiting. Negative for blood in stool and diarrhea.  Genitourinary:  Negative for flank pain and frequency.  Musculoskeletal:  Positive for falls. Negative for back pain.  Skin:   Negative for rash.  Neurological:  Positive for headaches (migraines).      Objective:    Physical Exam Constitutional:      Appearance: Normal appearance.  HENT:     Head: Normocephalic and atraumatic.     Right Ear: External ear normal.     Left Ear: External ear normal.  Pulmonary:     Effort: Pulmonary effort is normal.  Musculoskeletal:        General: Normal range of motion.     Cervical back: Normal range of motion.  Skin:    General: Skin is dry.  Neurological:     Mental Status: She is alert and oriented to person, place, and  time.  Psychiatric:        Behavior: Behavior normal.    There were no vitals taken for this visit. Wt Readings from Last 3 Encounters:  05/11/21 (!) 337 lb 6 oz (153 kg)  03/02/21 (!) 338 lb (153.3 kg)  05/14/20 (!) 335 lb 3.2 oz (152 kg)    Diabetic Foot Exam - Simple   No data filed    Lab Results  Component Value Date   WBC 8.2 12/01/2020   HGB 14.4 12/01/2020   HCT 42.5 12/01/2020   PLT 344.0 12/01/2020   GLUCOSE 95 12/01/2020   CHOL 146 12/01/2020   TRIG 108.0 12/01/2020   HDL 53.10 12/01/2020   LDLCALC 71 12/01/2020   ALT 19 12/01/2020   AST 18 12/01/2020   NA 138 12/01/2020   K 3.9 12/01/2020   CL 100 12/01/2020   CREATININE 0.74 12/01/2020   BUN 12 12/01/2020   CO2 31 12/01/2020   TSH 1.08 12/01/2020   HGBA1C 5.6 12/01/2020    Lab Results  Component Value Date   TSH 1.08 12/01/2020   Lab Results  Component Value Date   WBC 8.2 12/01/2020   HGB 14.4 12/01/2020   HCT 42.5 12/01/2020   MCV 89.3 12/01/2020   PLT 344.0 12/01/2020   Lab Results  Component Value Date   NA 138 12/01/2020   K 3.9 12/01/2020   CO2 31 12/01/2020   GLUCOSE 95 12/01/2020   BUN 12 12/01/2020   CREATININE 0.74 12/01/2020   BILITOT 0.4 12/01/2020   ALKPHOS 116 12/01/2020   AST 18 12/01/2020   ALT 19 12/01/2020   PROT 7.2 12/01/2020   ALBUMIN 3.6 12/01/2020   CALCIUM 9.2 12/01/2020   GFR 89.78 12/01/2020   Lab Results   Component Value Date   CHOL 146 12/01/2020   Lab Results  Component Value Date   HDL 53.10 12/01/2020   Lab Results  Component Value Date   LDLCALC 71 12/01/2020   Lab Results  Component Value Date   TRIG 108.0 12/01/2020   Lab Results  Component Value Date   CHOLHDL 3 12/01/2020   Lab Results  Component Value Date   HGBA1C 5.6 12/01/2020       Assessment & Plan:   Problem List Items Addressed This Visit     HTN (hypertension)    Monitor and report any concerns, no changes to meds. Encouraged heart healthy diet such as the DASH diet and exercise as tolerated.       Hyperglycemia    hgba1c acceptable, minimize simple carbs. Increase exercise as tolerated.       Migraine    She has been having headaches with photophobia, phonophobia, nausea and even some vomiting at times. They were more frequent earlier in the year but they are still occurring. Encouraged increased hydration, 64 ounces of clear fluids daily. Minimize alcohol and caffeine. Eat small frequent meals with lean proteins and complex carbs. Avoid high and low blood sugars. Get adequate sleep, 7-8 hours a night. Needs exercise daily preferably in the morning. She is given a prescription for Imitrex 50 mg tabs, 1 tab po once and can repeat in 2 hours if HA persists.          Meds ordered this encounter  Medications   SUMAtriptan (IMITREX) 50 MG tablet    Sig: Take 1 tablet (50 mg total) by mouth once for 1 dose. May repeat in 2 hours if headache persists or recurs.    Dispense:  10 tablet    Refill:  1     I discussed the assessment and treatment plan with the patient. The patient was provided an opportunity to ask questions and all were answered. The patient agreed with the plan and demonstrated an understanding of the instructions.   The patient was advised to call back or seek an in-person evaluation if the symptoms worsen or if the condition fails to improve as anticipated.  I provided 25 minutes  of face-to-face time during this encounter.   [name], MD [facility] [phone] (phone) [phone] (fax)  [facility]   I, Billie Lade, acting as a scribe for [name], MD, have documented all relevent documentation on behalf of [name], MD, as directed by [name], MD while in the presence of [name], MD.  I, Bradd Canary, MD personally performed the services described in this documentation. All medical record entries made by the scribe were at my direction and in my presence. I have reviewed the chart and agree that the record reflects my personal performance and is accurate and complete

## 2021-05-15 DIAGNOSIS — G43909 Migraine, unspecified, not intractable, without status migrainosus: Secondary | ICD-10-CM | POA: Insufficient documentation

## 2021-05-15 NOTE — Assessment & Plan Note (Signed)
Monitor and report any concerns, no changes to meds. Encouraged heart healthy diet such as the DASH diet and exercise as tolerated.  ?

## 2021-05-15 NOTE — Assessment & Plan Note (Signed)
hgba1c acceptable, minimize simple carbs. Increase exercise as tolerated.  

## 2021-05-15 NOTE — Assessment & Plan Note (Signed)
She has been having headaches with photophobia, phonophobia, nausea and even some vomiting at times. They were more frequent earlier in the year but they are still occurring. Encouraged increased hydration, 64 ounces of clear fluids daily. Minimize alcohol and caffeine. Eat small frequent meals with lean proteins and complex carbs. Avoid high and low blood sugars. Get adequate sleep, 7-8 hours a night. Needs exercise daily preferably in the morning. She is given a prescription for Imitrex 50 mg tabs, 1 tab po once and can repeat in 2 hours if HA persists.

## 2021-06-08 ENCOUNTER — Telehealth: Payer: 59 | Admitting: Family Medicine

## 2021-06-08 ENCOUNTER — Other Ambulatory Visit (HOSPITAL_BASED_OUTPATIENT_CLINIC_OR_DEPARTMENT_OTHER): Payer: Self-pay

## 2021-06-08 DIAGNOSIS — R739 Hyperglycemia, unspecified: Secondary | ICD-10-CM

## 2021-06-08 DIAGNOSIS — G43909 Migraine, unspecified, not intractable, without status migrainosus: Secondary | ICD-10-CM

## 2021-06-08 DIAGNOSIS — E669 Obesity, unspecified: Secondary | ICD-10-CM

## 2021-06-08 DIAGNOSIS — N950 Postmenopausal bleeding: Secondary | ICD-10-CM

## 2021-06-08 DIAGNOSIS — E785 Hyperlipidemia, unspecified: Secondary | ICD-10-CM | POA: Diagnosis not present

## 2021-06-08 DIAGNOSIS — I10 Essential (primary) hypertension: Secondary | ICD-10-CM | POA: Diagnosis not present

## 2021-06-08 DIAGNOSIS — R609 Edema, unspecified: Secondary | ICD-10-CM

## 2021-06-08 MED ORDER — CETIRIZINE HCL 10 MG PO TABS
10.0000 mg | ORAL_TABLET | Freq: Every day | ORAL | 3 refills | Status: DC
  Filled 2021-06-08: qty 100, 100d supply, fill #0

## 2021-06-08 MED ORDER — FUROSEMIDE 20 MG PO TABS
20.0000 mg | ORAL_TABLET | Freq: Every day | ORAL | 3 refills | Status: DC | PRN
  Filled 2021-06-08: qty 60, 30d supply, fill #0

## 2021-06-08 NOTE — Assessment & Plan Note (Signed)
Encouraged increased hydration, 64 ounces of clear fluids daily. Minimize alcohol and caffeine. Eat small frequent meals with lean proteins and complex carbs. Avoid high and low blood sugars. Get adequate sleep, 7-8 hours a night. Needs exercise daily preferably in the morning. She has FMLA for migraines and did have to stay out for a short time. She feels well now

## 2021-06-08 NOTE — Progress Notes (Signed)
Subjective:   By signing my name below, I, Pamela Solis, attest that this documentation has been prepared under the direction and in the presence of Pamela Solis 05/27/2021   Patient ID: Pamela Solis, female    DOB: [birth year], 60 y.o.   MRN: [id number]  Chief Complaint  Patient presents with   3 months f/u    HPI Patient is in today for an office visit and other chronic medical concerns. She reports she has had some increased migraines recently but her FMLA covered her missed work and she is feeling well today. She notes she injured her left knee recently and while the pain is imrpoving she is noting increased swellling. Slowly improving. Denies CP/palp/SOB/HA/congestion/fevers/GI or GU c/o. Taking meds as prescribed   Patient denies any blood in stool, chest pains, palpitations, shortness of breath, congestion, fever, of GI or GU complaints.  Past Medical History:  Diagnosis Date   Acute bronchitis 01/21/2016   Allergic state 01/31/2017   Allergy    Anemia    Asthma    Cervical cancer screening 06/25/2015   Chronic kidney disease    Depression    Edema 11/24/2014   Hypertension    Obesity    Preventative health care 07/31/2016   Snoring 11/24/2014   Thoracic outlet syndrome 11/24/2014    Past Surgical History:  Procedure Laterality Date   CESAREAN SECTION     ENDOMETRIAL ABLATION  2011   TUBAL LIGATION      Family History  Problem Relation Age of Onset   Obesity Mother    Arthritis Mother        rheumatoid   Stroke Father     Social History   Socioeconomic History   Marital status: Married    Spouse name: Not on file   Number of children: Not on file   Years of education: Not on file   Highest education level: Not on file  Occupational History   Not on file  Tobacco Use   Smoking status: Never   Smokeless tobacco: Never  Substance and Sexual Activity   Alcohol use: No   Drug use: No   Sexual activity: Not on file    Comment: lives with husband and  daughters, works for [employer] no dietary restrictions  Other Topics Concern   Not on file  Social History Narrative   Not on file   Social Determinants of Health   Financial Resource Strain: Not on file  Food Insecurity: Not on file  Transportation Needs: Not on file  Physical Activity: Not on file  Stress: Not on file  Social Connections: Not on file  Intimate Partner Violence: Not on file    Outpatient Medications Prior to Visit  Medication Sig Dispense Refill   albuterol (VENTOLIN HFA) 108 (90 Base) MCG/ACT inhaler Inhale 2 puffs into the lungs every 6 (six) hours as needed. For wheezing 18 g 2   budesonide-formoterol (SYMBICORT) 80-4.5 MCG/ACT inhaler INHALE 2 PUFFS BY MOUTH INTO THE LUNGS 2 (TWO) TIMES DAILY. BEGIN USING THIS INHALER ONCE YOU HAVE COMPLETED THE ORAL PREDNISONE DOSING SCHE 10.2 g 0   COVID-19 mRNA vaccine, Pfizer, 30 MCG/0.3ML injection INJECT AS DIRECTED .3 mL 0   diclofenac Sodium (VOLTAREN) 1 % GEL Apply 4 g topically 4 (four) times daily. 150 g 2   ibuprofen (ADVIL,MOTRIN) 800 MG tablet Take 1 tablet (800 mg total) by mouth every 8 (eight) hours as needed. For migraine 270 tablet 0   meloxicam (MOBIC) 15 MG  tablet Take 1 tablet (15 mg total) by mouth daily. 30 tablet 0   montelukast (SINGULAIR) 10 MG tablet TAKE 1 TABLET (10 MG TOTAL) BY MOUTH DAILY. 90 tablet 0   sertraline (ZOLOFT) 100 MG tablet TAKE 1 TABLET (100 MG TOTAL) BY MOUTH DAILY. 30 tablet 3   triamterene-hydrochlorothiazide (MAXZIDE-25) 37.5-25 MG tablet TAKE 1 TABLET BY MOUTH EVERY MORNING. 90 tablet 0   cetirizine (ZYRTEC) 10 MG tablet TAKE 1 TABLET (10 MG TOTAL) BY MOUTH DAILY. 100 tablet 3   furosemide (LASIX) 20 MG tablet Take 1 tablet (20 mg total) by mouth daily as needed for fluid or edema (weight gain>3# in 24 hours). 30 tablet 3   SUMAtriptan (IMITREX) 50 MG tablet Take 1 tablet (50 mg total) by mouth once for 1 dose. May repeat in 2 hours if headache persists or recurs. 10 tablet 1   No  facility-administered medications prior to visit.    Allergies  Allergen Reactions   Penicillins Rash    Review of Systems  Constitutional:  Negative for malaise/fatigue and weight loss.  HENT:  Negative for congestion and sore throat.   Eyes:  Negative for pain and redness.  Respiratory:  Positive for shortness of breath. Negative for cough.   Cardiovascular:  Positive for leg swelling. Negative for chest pain and palpitations.  Gastrointestinal:  Negative for abdominal pain, blood in stool and vomiting.  Genitourinary:  Negative for dysuria, frequency and urgency.  Musculoskeletal:  Positive for joint pain. Negative for falls and myalgias.  Neurological:  Positive for headaches. Negative for dizziness.  Psychiatric/Behavioral:  Negative for memory loss. The patient is not nervous/anxious and does not have insomnia.       Objective:    Physical Exam Constitutional:      General: She is not in acute distress.    Appearance: Normal appearance. She is not ill-appearing or toxic-appearing.  HENT:     Head: Normocephalic and atraumatic.     Right Ear: External ear normal.     Left Ear: External ear normal.     Nose: Nose normal.  Eyes:     General:        Right eye: No discharge.        Left eye: No discharge.  Pulmonary:     Effort: Pulmonary effort is normal.  Skin:    Findings: No rash.  Neurological:     Mental Status: She is alert and oriented to person, place, and time.  Psychiatric:        Behavior: Behavior normal.    There were no vitals taken for this visit. Wt Readings from Last 3 Encounters:  05/11/21 (!) 337 lb 6 oz (153 kg)  03/02/21 (!) 338 lb (153.3 kg)  05/14/20 (!) 335 lb 3.2 oz (152 kg)    Diabetic Foot Exam - Simple   No data filed    Lab Results  Component Value Date   WBC 8.2 12/01/2020   HGB 14.4 12/01/2020   HCT 42.5 12/01/2020   PLT 344.0 12/01/2020   GLUCOSE 95 12/01/2020   CHOL 146 12/01/2020   TRIG 108.0 12/01/2020   HDL 53.10  12/01/2020   LDLCALC 71 12/01/2020   ALT 19 12/01/2020   AST 18 12/01/2020   NA 138 12/01/2020   K 3.9 12/01/2020   CL 100 12/01/2020   CREATININE 0.74 12/01/2020   BUN 12 12/01/2020   CO2 31 12/01/2020   TSH 1.08 12/01/2020   HGBA1C 5.6 12/01/2020    Lab  Results  Component Value Date   TSH 1.08 12/01/2020   Lab Results  Component Value Date   WBC 8.2 12/01/2020   HGB 14.4 12/01/2020   HCT 42.5 12/01/2020   MCV 89.3 12/01/2020   PLT 344.0 12/01/2020   Lab Results  Component Value Date   NA 138 12/01/2020   K 3.9 12/01/2020   CO2 31 12/01/2020   GLUCOSE 95 12/01/2020   BUN 12 12/01/2020   CREATININE 0.74 12/01/2020   BILITOT 0.4 12/01/2020   ALKPHOS 116 12/01/2020   AST 18 12/01/2020   ALT 19 12/01/2020   PROT 7.2 12/01/2020   ALBUMIN 3.6 12/01/2020   CALCIUM 9.2 12/01/2020   GFR 89.78 12/01/2020   Lab Results  Component Value Date   CHOL 146 12/01/2020   Lab Results  Component Value Date   HDL 53.10 12/01/2020   Lab Results  Component Value Date   LDLCALC 71 12/01/2020   Lab Results  Component Value Date   TRIG 108.0 12/01/2020   Lab Results  Component Value Date   CHOLHDL 3 12/01/2020   Lab Results  Component Value Date   HGBA1C 5.6 12/01/2020       Assessment & Plan:   Problem List Items Addressed This Visit     HTN (hypertension) - Primary   Relevant Medications   furosemide (LASIX) 20 MG tablet   Other Relevant Orders   CBC   Comprehensive metabolic panel   TSH   Obesity    Encouraged DASH or MIND diet, decrease po intake and increase exercise as tolerated. Needs 7-8 hours of sleep nightly. Avoid trans fats, eat small, frequent meals every 4-5 hours with lean proteins, complex carbs and healthy fats. Minimize simple carbs, high fat foods and processed foods. Consider Wegovy or Saxenda      Edema    Elevate feet, increase walking, minimize sodium, try compression hose and given Furosemide to use prn. If taken frequently let us  know and we will check a cmp      Hyperglycemia    Encourage heart healthy diet such as MIND or DASH diet, increase exercise, avoid trans fats, simple carbohydrates and processed foods, consider a krill or fish or flaxseed oil cap daily.       Relevant Orders   Comprehensive metabolic panel   Hemoglobin A1c   Hyperlipidemia    Encourage heart healthy diet such as MIND or DASH diet, increase exercise, avoid trans fats, simple carbohydrates and processed foods, consider a krill or fish or flaxseed oil cap daily.       Relevant Medications   furosemide (LASIX) 20 MG tablet   Other Relevant Orders   Lipid panel   Migraine    Encouraged increased hydration, 64 ounces of clear fluids daily. Minimize alcohol and caffeine. Eat small frequent meals with lean proteins and complex carbs. Avoid high and low blood sugars. Get adequate sleep, 7-8 hours a night. Needs exercise daily preferably in the morning. She has FMLA for migraines and did have to stay out for a short time. She feels well now      Relevant Medications   furosemide (LASIX) 20 MG tablet   Post-menopausal bleeding    Patient had not had a cycle in years but she experienced one recently. She is referred to gynecology for further evaluation      Relevant Orders   Ambulatory referral to Obstetrics / Gynecology    F/U in 3 months  Meds ordered this encounter  Medications  furosemide (LASIX) 20 MG tablet    Sig: Take 1-2 tablets (20-40 mg total) by mouth daily as needed for fluid or edema (weight gain>3 pounds in 24 hours).    Dispense:  60 tablet    Refill:  3   cetirizine (ZYRTEC) 10 MG tablet    Sig: Take 1 tablet (10 mg total) by mouth daily.    Dispense:  100 tablet    Refill:  3    I, Pamela Edge, MD, personally preformed the services described in this documentation.  All medical record entries made by the scribe were at my direction and in my presence.  I have reviewed the chart and discharge instructions (if  applicable) and agree that the record reflects my personal performance and is accurate and complete. Pamela Solis 06/08/2021   I,Jada Bradford,acting as a scribe for Pamela Edge, MD.,have documented all relevant documentation on the behalf of Pamela Edge, MD,as directed by  Pamela Edge, MD while in the presence of Pamela Edge, MD.  I, Bradd Canary, MD personally performed the services described in this documentation. All medical record entries made by the scribe were at my direction and in my presence. I have reviewed the chart and agree that the record reflects my personal performance and is accurate and complete     Pamela Edge, MD

## 2021-06-08 NOTE — Patient Instructions (Addendum)
Check with insurance regarding coverage for Wegovy (weekly) or Saxenda (daily) or Rybelsus (oral)

## 2021-06-09 DIAGNOSIS — N950 Postmenopausal bleeding: Secondary | ICD-10-CM | POA: Insufficient documentation

## 2021-06-09 NOTE — Assessment & Plan Note (Signed)
Patient had not had a cycle in years but she experienced one recently. She is referred to gynecology for further evaluation

## 2021-06-09 NOTE — Assessment & Plan Note (Signed)
Encourage heart healthy diet such as MIND or DASH diet, increase exercise, avoid trans fats, simple carbohydrates and processed foods, consider a krill or fish or flaxseed oil cap daily.  °

## 2021-06-09 NOTE — Assessment & Plan Note (Signed)
Elevate feet, increase walking, minimize sodium, try compression hose and given Furosemide to use prn. If taken frequently let us know and we will check a cmp

## 2021-06-09 NOTE — Assessment & Plan Note (Signed)
Encouraged DASH or MIND diet, decrease po intake and increase exercise as tolerated. Needs 7-8 hours of sleep nightly. Avoid trans fats, eat small, frequent meals every 4-5 hours with lean proteins, complex carbs and healthy fats. Minimize simple carbs, high fat foods and processed foods. Consider Wegovy or Saxenda °

## 2021-06-22 ENCOUNTER — Other Ambulatory Visit: Payer: Self-pay

## 2021-06-22 ENCOUNTER — Other Ambulatory Visit (INDEPENDENT_AMBULATORY_CARE_PROVIDER_SITE_OTHER): Payer: 59

## 2021-06-22 DIAGNOSIS — R739 Hyperglycemia, unspecified: Secondary | ICD-10-CM | POA: Diagnosis not present

## 2021-06-22 DIAGNOSIS — I10 Essential (primary) hypertension: Secondary | ICD-10-CM | POA: Diagnosis not present

## 2021-06-22 DIAGNOSIS — E785 Hyperlipidemia, unspecified: Secondary | ICD-10-CM

## 2021-06-22 LAB — COMPREHENSIVE METABOLIC PANEL
ALT: 23 U/L (ref 0–35)
AST: 27 U/L (ref 0–37)
Albumin: 3.9 g/dL (ref 3.5–5.2)
Alkaline Phosphatase: 100 U/L (ref 39–117)
BUN: 13 mg/dL (ref 6–23)
CO2: 31 mEq/L (ref 19–32)
Calcium: 9 mg/dL (ref 8.4–10.5)
Chloride: 96 mEq/L (ref 96–112)
Creatinine, Ser: 0.83 mg/dL (ref 0.40–1.20)
GFR: 77.93 mL/min (ref >60.00)
Glucose, Bld: 101 mg/dL — ABNORMAL HIGH (ref 70–99)
Potassium: 4.1 mEq/L (ref 3.5–5.1)
Sodium: 136 mEq/L (ref 135–145)
Total Bilirubin: 0.9 mg/dL (ref 0.2–1.2)
Total Protein: 7.5 g/dL (ref 6.0–8.3)

## 2021-06-22 LAB — LIPID PANEL
Cholesterol: 146 mg/dL (ref 0–200)
HDL: 51.2 mg/dL (ref >39.00)
LDL Cholesterol: 80 mg/dL (ref 0–99)
NonHDL: 95.05
Total CHOL/HDL Ratio: 3
Triglycerides: 76 mg/dL (ref 0.0–149.0)
VLDL: 15.2 mg/dL (ref 0.0–40.0)

## 2021-06-22 LAB — CBC
HCT: 42.4 % (ref 36.0–46.0)
Hemoglobin: 13.8 g/dL (ref 12.0–15.0)
MCHC: 32.5 g/dL (ref 30.0–36.0)
MCV: 90.1 fl (ref 78.0–100.0)
Platelets: 380 10*3/uL (ref 150.0–400.0)
RBC: 4.71 Mil/uL (ref 3.87–5.11)
RDW: 12.8 % (ref 11.5–15.5)
WBC: 9.3 10*3/uL (ref 4.0–10.5)

## 2021-06-22 LAB — HEMOGLOBIN A1C: Hgb A1c MFr Bld: 5.8 % (ref 4.6–6.5)

## 2021-06-22 LAB — TSH: TSH: 1.79 u[IU]/mL (ref 0.35–5.50)

## 2021-07-21 ENCOUNTER — Ambulatory Visit (INDEPENDENT_AMBULATORY_CARE_PROVIDER_SITE_OTHER): Payer: 59

## 2021-07-21 ENCOUNTER — Other Ambulatory Visit: Payer: Self-pay

## 2021-07-21 ENCOUNTER — Other Ambulatory Visit (HOSPITAL_BASED_OUTPATIENT_CLINIC_OR_DEPARTMENT_OTHER): Payer: Self-pay

## 2021-07-21 DIAGNOSIS — Z23 Encounter for immunization: Secondary | ICD-10-CM

## 2021-07-21 MED FILL — Sertraline HCl Tab 100 MG: ORAL | 30 days supply | Qty: 30 | Fill #1 | Status: AC

## 2021-07-21 NOTE — Progress Notes (Signed)
Pt here for second Shingles vaccine per Abner Greenspan.  Vaccine given in right deltoid. Pt handled well and given vaccine info sheet.

## 2021-07-28 ENCOUNTER — Other Ambulatory Visit (HOSPITAL_COMMUNITY)
Admission: RE | Admit: 2021-07-28 | Discharge: 2021-07-28 | Disposition: A | Discharge status: Home / Self Care | Payer: 59 | Source: Ambulatory Visit | Attending: Obstetrics & Gynecology | Admitting: Obstetrics & Gynecology

## 2021-07-28 ENCOUNTER — Encounter: Payer: Self-pay | Admitting: Obstetrics & Gynecology

## 2021-07-28 ENCOUNTER — Other Ambulatory Visit (HOSPITAL_BASED_OUTPATIENT_CLINIC_OR_DEPARTMENT_OTHER): Payer: Self-pay

## 2021-07-28 ENCOUNTER — Ambulatory Visit: Payer: 59 | Admitting: Obstetrics & Gynecology

## 2021-07-28 ENCOUNTER — Other Ambulatory Visit: Payer: Self-pay

## 2021-07-28 ENCOUNTER — Encounter: Payer: Self-pay | Admitting: General Practice

## 2021-07-28 VITALS — BP 145/70 | HR 105 | Ht 65.0 in | Wt 335.0 lb

## 2021-07-28 DIAGNOSIS — N950 Postmenopausal bleeding: Secondary | ICD-10-CM | POA: Insufficient documentation

## 2021-07-28 DIAGNOSIS — N858 Other specified noninflammatory disorders of uterus: Secondary | ICD-10-CM | POA: Diagnosis not present

## 2021-07-28 MED ORDER — MEGESTROL ACETATE 40 MG PO TABS
40.0000 mg | ORAL_TABLET | Freq: Every day | ORAL | 5 refills | Status: DC
  Filled 2021-07-28: qty 60, 30d supply, fill #0
  Filled 2021-10-07: qty 60, 30d supply, fill #1
  Filled 2022-02-10: qty 60, 30d supply, fill #2
  Filled 2022-03-25: qty 60, 30d supply, fill #3

## 2021-07-28 NOTE — Patient Instructions (Signed)
You will be called with results    ENDOMETRIAL BIOPSY POST-PROCEDURE INSTRUCTIONS  You may take Ibuprofen, Aleve or Tylenol for pain if needed.  Cramping should resolve within in 24 hours.  You may have a small amount of spotting.  You should wear a mini pad for the next few days.  You may have intercourse after 24 hours.  You need to call if you have any pelvic pain, fever, heavy bleeding or foul smelling vaginal discharge.  Shower or bathe as normal  6. We will call you within one week with results.

## 2021-07-28 NOTE — Progress Notes (Signed)
      GYNECOLOGY OFFICE PROCEDURE NOTE   Pamela Solis is a 60 y.o. [phone] with history of morbid obesity here for endometrial biopsy for postmenopausal bleeding. Patient had office endometrial ablation approximately ten years ago.  Patient states she started bleeding on Sept 5, 2022 and has had continued bleeding on most days. No other concerning symptoms.  Last pap was in 2016.   ENDOMETRIAL BIOPSY     The indications for endometrial biopsy were reviewed.   Risks of the biopsy including cramping, bleeding, infection, uterine perforation, inadequate specimen and need for additional procedures were discussed. Offered alternative of hysteroscopy, dilation and curettage in OR. The patient states she understands the R/B/I/A and agrees to undergo procedure today. Consent was signed. Time out was performed.    Patient was positioned in dorsal lithotomy position. A vaginal speculum was placed.  The cervix was visualized, pap smear obtained.  The cervix and was prepped with Betadine.  A single-toothed tenaculum was placed on the anterior lip of the cervix to stabilize it. The 3 mm pipelle was easily introduced into the endometrial cavity without difficulty to a depth of 10 cm, and a moderate amount of tissue was obtained and sent to pathology. The instruments were removed from the patient's vagina. Minimal bleeding from the cervix was noted. The patient tolerated the procedure well.   Patient was given post procedure instructions.  Will follow up pap smear and pathology and manage accordingly.  Pelvic ultrasound also ordered. Patient will be contacted with results and recommendations.  Routine preventative health maintenance measures emphasized.       Jaynie Collins, MD, FACOG Obstetrician & Gynecologist, [facility] for [employer], [facility]

## 2021-07-29 ENCOUNTER — Ambulatory Visit (HOSPITAL_BASED_OUTPATIENT_CLINIC_OR_DEPARTMENT_OTHER)
Admission: RE | Admit: 2021-07-29 | Discharge: 2021-07-29 | Disposition: A | Discharge status: Home / Self Care | Payer: 59 | Source: Ambulatory Visit | Attending: Obstetrics & Gynecology | Admitting: Obstetrics & Gynecology

## 2021-07-29 DIAGNOSIS — N950 Postmenopausal bleeding: Secondary | ICD-10-CM | POA: Insufficient documentation

## 2021-07-29 DIAGNOSIS — N888 Other specified noninflammatory disorders of cervix uteri: Secondary | ICD-10-CM | POA: Diagnosis not present

## 2021-07-30 LAB — SURGICAL PATHOLOGY

## 2021-08-02 ENCOUNTER — Telehealth: Payer: Self-pay

## 2021-08-02 LAB — CYTOLOGY - PAP
Comment: NEGATIVE
Diagnosis: NEGATIVE
High risk HPV: NEGATIVE

## 2021-08-02 NOTE — Telephone Encounter (Signed)
Called pt to discuss Endometrial bx. Pt made aware that her bx was benign and her US showed possible fibroid in the inner lining of her uterus. Explained to pt that if bleeding continues she may need a Hysteroscopy and removal of fibroid or other management. Pt is scheduled for f/u visit to discuss other recommendations. Understanding was voiced. Glenis Musolf l Lynnet Hefley, CMA

## 2021-08-02 NOTE — Telephone Encounter (Signed)
-----   Message from Tereso Newcomer, MD sent at 08/02/2021 12:17 PM EST ----- Benign endometrial biopsy.  Pelvic ultrasound showed possible fibroid in the inner lining of the uterus. If bleeding continues, patient may need Hysteroscopy and removal of fibroid or other management.   This can be discussed in detail at a follow up visit. Please call to inform patient of results and recommendations.

## 2021-08-04 ENCOUNTER — Ambulatory Visit: Payer: 59 | Attending: Internal Medicine

## 2021-08-04 ENCOUNTER — Encounter: Payer: Self-pay | Admitting: Obstetrics & Gynecology

## 2021-08-04 ENCOUNTER — Other Ambulatory Visit: Payer: Self-pay

## 2021-08-04 ENCOUNTER — Ambulatory Visit: Payer: 59 | Admitting: Obstetrics & Gynecology

## 2021-08-04 VITALS — BP 174/84 | HR 101 | Wt 335.0 lb

## 2021-08-04 DIAGNOSIS — R030 Elevated blood-pressure reading, without diagnosis of hypertension: Secondary | ICD-10-CM | POA: Diagnosis not present

## 2021-08-04 DIAGNOSIS — N950 Postmenopausal bleeding: Secondary | ICD-10-CM | POA: Diagnosis not present

## 2021-08-04 DIAGNOSIS — Z23 Encounter for immunization: Secondary | ICD-10-CM

## 2021-08-04 NOTE — Progress Notes (Signed)
   Covid-19 Vaccination Clinic  Name:  Pamela Solis    MRN: [id number] DOB: Jul 13, 1961  08/04/2021  [name] was observed post Covid-19 immunization for 15 minutes without incident. She was provided with Vaccine Information Sheet and instruction to access the V-Safe system.   [name] was instructed to call 911 with any severe reactions post vaccine: Difficulty breathing  Swelling of face and throat  A fast heartbeat  A bad rash all over body  Dizziness and weakness   Immunizations Administered     Name Date Dose VIS Date Route   Pfizer Covid-19 Vaccine Bivalent Booster 08/04/2021  1:18 PM 0.3 mL 05/26/2021 Intramuscular   Manufacturer: [employer], [employer]   Lot: [id number]   NDC: [address]-0      Drusilla Kanner, PharmD, MBA Clinical Acute Care Pharmacist

## 2021-08-04 NOTE — Progress Notes (Signed)
History:  60 y.o. [id number] here today for f/u of PMPB. Pt here to review results of bx and Endo bx. Pt denies new sx.  Currently on Megace daily.   The following portions of the patient's history were reviewed and updated as appropriate: allergies, current medications, past family history, past medical history, past social history, past surgical history and problem list.  Review of Systems:  Pertinent items are noted in HPI.    Objective:  Physical Exam Blood pressure (!) 174/84, pulse (!) 101, weight (!) 335 lb (152 kg). BP (!) 174/84   Pulse (!) 101   Wt (!) 335 lb (152 kg)   BMI 55.75 kg/m   CONSTITUTIONAL: Well-developed, well-nourished female in no acute distress.  HENT:  Normocephalic, atraumatic EYES: Conjunctivae and EOM are normal. No scleral icterus.  NECK: Normal range of motion SKIN: Skin is warm and dry. No rash noted. Not diaphoretic.No pallor. NEUROLGIC: Alert and oriented to person, place, and time. Normal coordination.  Pelvic exam deferred  Labs and Imaging  07/28/2021 Clinical History: PMB (nt)    FINAL MICROSCOPIC DIAGNOSIS:   A. ENDOMETRIUM, BIOPSY:  -  Disordered proliferative endometrium with tubal metaplasia  -  No hyperplasia or malignancy identified   US PELVIC COMPLETE WITH TRANSVAGINAL  Result Date: 07/30/2021 CLINICAL DATA:  Postmenopausal bleeding, morbid obesity, ablation 10 years ago EXAM: TRANSABDOMINAL AND TRANSVAGINAL ULTRASOUND OF PELVIS TECHNIQUE: Both transabdominal and transvaginal ultrasound examinations of the pelvis were performed. Transabdominal technique was performed for global imaging of the pelvis including uterus, ovaries, adnexal regions, and pelvic cul-de-sac. It was necessary to proceed with endovaginal exam following the transabdominal exam to visualize the uterus, endometrium, and ovaries. COMPARISON:  None FINDINGS: Uterus Measurements: 9.0 x 5.6 x 6.4 cm = volume: 169 mL. Anteverted. Poorly visualized due to body habitus and  shadowing from bowel. Nabothian cysts at cervix. Suspected mass at upper uterine segment though this is not well visualized, question 3.3 x 2.5 x 3.2 cm submucosal. No additional masses. Endometrium Thickness: 5 mm.  No endometrial fluid or mass Right ovary Not visualized, likely obscured by bowel Left ovary Not visualized, likely obscured by bowel Other findings No free pelvic fluid.  No adnexal masses. IMPRESSION: Nonvisualization of ovaries. Suspected submucosal leiomyoma at posterior upper uterus 3.3 cm diameter. 5 mm endometrial complex; in the setting of post-menopausal bleeding, endometrial sampling is indicated to exclude carcinoma. If results are benign, sonohysterogram should be considered for focal lesion work-up. (Ref: Radiological Reasoning: Algorithmic Workup of Abnormal Vaginal Bleeding with Endovaginal Sonography and Sonohysterography. AJR 2008; 606:T01-60) These results will be called to the ordering clinician or representative by the Radiologist Assistant, and communication documented in the PACS or [employer]. Electronically Signed   By: Ulyses Southward M.D.   On: 07/30/2021 09:50    Assessment & Plan:   Diagnoses and all orders for this visit:  Post-menopausal bleeding  Elevated blood pressure reading   F/u with primary care for repeat BP exam   Keep Megace 40mg  daily.  Will keep for 3 months to start and readdress at next visit.  F/u in 3-6 months or sooner if the bleeding returns.   Total face-to-face time with patient, review of chart, discussion with consultant and coordination of care was .   Sophie Tamez L. Harraway-Smith, M.D., 

## 2021-08-11 ENCOUNTER — Encounter: Payer: Self-pay | Admitting: Obstetrics & Gynecology

## 2021-08-23 ENCOUNTER — Other Ambulatory Visit (HOSPITAL_BASED_OUTPATIENT_CLINIC_OR_DEPARTMENT_OTHER): Payer: Self-pay

## 2021-08-23 MED ORDER — PFIZER COVID-19 VAC BIVALENT 30 MCG/0.3ML IM SUSP
INTRAMUSCULAR | 0 refills | Status: DC
  Filled 2021-08-23: qty 0.3, 1d supply, fill #0

## 2021-08-25 ENCOUNTER — Encounter: Payer: 59 | Admitting: Obstetrics & Gynecology

## 2021-10-06 ENCOUNTER — Telehealth: Payer: Self-pay | Admitting: Family Medicine

## 2021-10-06 DIAGNOSIS — Z0279 Encounter for issue of other medical certificate: Secondary | ICD-10-CM

## 2021-10-06 NOTE — Telephone Encounter (Signed)
Forms faxed into front office Placed in blyth bin up front 

## 2021-10-07 ENCOUNTER — Other Ambulatory Visit (HOSPITAL_BASED_OUTPATIENT_CLINIC_OR_DEPARTMENT_OTHER): Payer: Self-pay

## 2021-10-07 ENCOUNTER — Other Ambulatory Visit: Payer: Self-pay | Admitting: Family Medicine

## 2021-10-07 ENCOUNTER — Ambulatory Visit (INDEPENDENT_AMBULATORY_CARE_PROVIDER_SITE_OTHER): Payer: 59 | Admitting: Family Medicine

## 2021-10-07 ENCOUNTER — Encounter: Payer: Self-pay | Admitting: Family Medicine

## 2021-10-07 VITALS — BP 122/68 | HR 108 | Temp 97.8°F | Resp 16 | Ht 65.0 in | Wt 329.8 lb

## 2021-10-07 DIAGNOSIS — F32A Depression, unspecified: Secondary | ICD-10-CM | POA: Diagnosis not present

## 2021-10-07 DIAGNOSIS — Z0000 Encounter for general adult medical examination without abnormal findings: Secondary | ICD-10-CM

## 2021-10-07 DIAGNOSIS — I10 Essential (primary) hypertension: Secondary | ICD-10-CM | POA: Diagnosis not present

## 2021-10-07 DIAGNOSIS — N950 Postmenopausal bleeding: Secondary | ICD-10-CM

## 2021-10-07 DIAGNOSIS — E669 Obesity, unspecified: Secondary | ICD-10-CM

## 2021-10-07 DIAGNOSIS — J45901 Unspecified asthma with (acute) exacerbation: Secondary | ICD-10-CM

## 2021-10-07 DIAGNOSIS — R739 Hyperglycemia, unspecified: Secondary | ICD-10-CM | POA: Diagnosis not present

## 2021-10-07 DIAGNOSIS — E785 Hyperlipidemia, unspecified: Secondary | ICD-10-CM

## 2021-10-07 MED ORDER — SERTRALINE HCL 100 MG PO TABS
100.0000 mg | ORAL_TABLET | Freq: Every day | ORAL | 1 refills | Status: DC
  Filled 2021-10-07: qty 90, 90d supply, fill #0
  Filled 2022-03-25: qty 90, 90d supply, fill #1

## 2021-10-07 MED ORDER — MONTELUKAST SODIUM 10 MG PO TABS
10.0000 mg | ORAL_TABLET | Freq: Every day | ORAL | 1 refills | Status: DC
  Filled 2021-10-07: qty 90, 90d supply, fill #0
  Filled 2022-03-02: qty 90, 90d supply, fill #1

## 2021-10-07 MED ORDER — ALBUTEROL SULFATE HFA 108 (90 BASE) MCG/ACT IN AERS
2.0000 | INHALATION_SPRAY | Freq: Four times a day (QID) | RESPIRATORY_TRACT | 2 refills | Status: AC | PRN
Start: 2021-10-07 — End: ?
  Filled 2021-10-07: qty 18, 25d supply, fill #0

## 2021-10-07 MED ORDER — AZITHROMYCIN 250 MG PO TABS
500.0000 mg | ORAL_TABLET | Freq: Every day | ORAL | 0 refills | Status: AC
  Filled 2021-10-07: qty 10, 5d supply, fill #0

## 2021-10-07 MED ORDER — CETIRIZINE HCL 10 MG PO TABS
10.0000 mg | ORAL_TABLET | Freq: Every day | ORAL | 3 refills | Status: DC
  Filled 2021-10-07: qty 100, 100d supply, fill #0
  Filled 2022-03-25: qty 100, 100d supply, fill #1

## 2021-10-07 MED ORDER — TRIAMTERENE-HCTZ 37.5-25 MG PO TABS
1.0000 | ORAL_TABLET | Freq: Every morning | ORAL | 1 refills | Status: DC
  Filled 2021-10-07: qty 90, 90d supply, fill #0
  Filled 2022-03-25: qty 90, 90d supply, fill #1

## 2021-10-07 MED ORDER — BENZONATATE 100 MG PO CAPS
100.0000 mg | ORAL_CAPSULE | Freq: Two times a day (BID) | ORAL | 0 refills | Status: DC | PRN
  Filled 2021-10-07: qty 20, 10d supply, fill #0

## 2021-10-07 MED ORDER — CARESTART COVID-19 HOME TEST VI KIT
PACK | 1 refills | Status: DC
  Filled 2021-10-07: qty 4, 4d supply, fill #0

## 2021-10-07 MED ORDER — AZITHROMYCIN 500 MG PO TABS: 500.0000 mg | ORAL_TABLET | Freq: Every day | ORAL | 0 refills | Status: DC

## 2021-10-07 MED FILL — Sertraline HCl Tab 100 MG: ORAL | 30 days supply | Qty: 30 | Fill #2 | Status: CN

## 2021-10-07 NOTE — Patient Instructions (Addendum)
Prevnar 20 is recommended due to your asthma can make a nurse visit, get at [facility] or at pharmacy  Send [place] a note when you are ready to try [facility] or Saxenda    Preventive Care 16-59 Years Old, Female Preventive care refers to lifestyle choices and visits with your health care provider that can promote health and wellness. Preventive care visits are also called wellness exams. What can I expect for my preventive care visit? Counseling Your health care provider may ask you questions about your: Medical history, including: Past medical problems. Family medical history. Pregnancy history. Current health, including: Menstrual cycle. Method of birth control. Emotional well-being. Home life and relationship well-being. Sexual activity and sexual health. Lifestyle, including: Alcohol, nicotine or tobacco, and drug use. Access to firearms. Diet, exercise, and sleep habits. Work and work Statistician. Sunscreen use. Safety issues such as seatbelt and bike helmet use. Physical exam Your health care provider will check your: Height and weight. These may be used to calculate your BMI (body mass index). BMI is a measurement that tells if you are at a healthy weight. Waist circumference. This measures the distance around your waistline. This measurement also tells if you are at a healthy weight and may help predict your risk of certain diseases, such as type 2 diabetes and high blood pressure. Heart rate and blood pressure. Body temperature. Skin for abnormal spots. What immunizations do I need? Vaccines are usually given at various ages, according to a schedule. Your health care provider will recommend vaccines for you based on your age, medical history, and lifestyle or other factors, such as travel or where you work. What tests do I need? Screening Your health care provider may recommend screening tests for certain conditions. This may include: Lipid and cholesterol levels. Diabetes  screening. This is done by checking your blood sugar (glucose) after you have not eaten for a while (fasting). Pelvic exam and Pap test. Hepatitis B test. Hepatitis C test. HIV (human immunodeficiency virus) test. STI (sexually transmitted infection) testing, if you are at risk. Lung cancer screening. Colorectal cancer screening. Mammogram. Talk with your health care provider about when you should start having regular mammograms. This may depend on whether you have a family history of breast cancer. BRCA-related cancer screening. This may be done if you have a family history of breast, ovarian, tubal, or peritoneal cancers. Bone density scan. This is done to screen for osteoporosis. Talk with your health care provider about your test results, treatment options, and if necessary, the need for more tests. Follow these instructions at home: Eating and drinking  Eat a diet that includes fresh fruits and vegetables, whole grains, lean protein, and low-fat dairy products. Take vitamin and mineral supplements as recommended by your health care provider. Do not drink alcohol if: Your health care provider tells you not to drink. You are pregnant, may be pregnant, or are planning to become pregnant. If you drink alcohol: Limit how much you have to 59-1 drink a day. Know how much alcohol is in your drink. In the [place] one drink equals one 12 oz bottle of beer (355 mL), one 5 oz glass of wine (148 mL), or one 1 oz glass of hard liquor (44 mL). Lifestyle Brush your teeth every morning and night with fluoride toothpaste. Floss one time each day. Exercise for at least 30 minutes 5 or more days each week. Do not use any products that contain nicotine or tobacco. These products include cigarettes, chewing tobacco, and vaping  devices, such as e-cigarettes. If you need help quitting, ask your health care provider. Do not use drugs. If you are sexually active, practice safe sex. Use a condom or other form of  protection to prevent STIs. If you do not wish to become pregnant, use a form of birth control. If you plan to become pregnant, see your health care provider for a prepregnancy visit. Take aspirin only as told by your health care provider. Make sure that you understand how much to take and what form to take. Work with your health care provider to find out whether it is safe and beneficial for you to take aspirin daily. Find healthy ways to manage stress, such as: Meditation, yoga, or listening to music. Journaling. Talking to a trusted person. Spending time with friends and family. Minimize exposure to UV radiation to reduce your risk of skin cancer. Safety Always wear your seat belt while driving or riding in a vehicle. Do not drive: If you have been drinking alcohol. Do not ride with someone who has been drinking. When you are tired or distracted. While texting. If you have been using any mind-altering substances or drugs. Wear a helmet and other protective equipment during sports activities. If you have firearms in your house, make sure you follow all gun safety procedures. Seek help if you have been physically or sexually abused. What's next? Visit your health care provider once a year for an annual wellness visit. Ask your health care provider how often you should have your eyes and teeth checked. Stay up to date on all vaccines. This information is not intended to replace advice given to you by your health care provider. Make sure you discuss any questions you have with your health care provider. Document Revised: 03/10/2021 Document Reviewed: 03/10/2021 Elsevier Patient Education  [place].

## 2021-10-08 NOTE — Assessment & Plan Note (Addendum)
With pharyngitis as well. COVID will be checked and if negative she will be treated with Azithromycin, Mucinex and probiotics, tessalon perles

## 2021-10-08 NOTE — Assessment & Plan Note (Signed)
Is following with gynecology and being maintained on Megace

## 2021-10-08 NOTE — Assessment & Plan Note (Signed)
Well controlled, no changes to meds. Encouraged heart healthy diet such as the DASH diet and exercise as tolerated.  °

## 2021-10-08 NOTE — Assessment & Plan Note (Signed)
hgba1c acceptable, minimize simple carbs. Increase exercise as tolerated.  

## 2021-10-08 NOTE — Assessment & Plan Note (Signed)
Encourage heart healthy diet such as MIND or DASH diet, increase exercise, avoid trans fats, simple carbohydrates and processed foods, consider a krill or fish or flaxseed oil cap daily.  °

## 2021-10-08 NOTE — Assessment & Plan Note (Signed)
Is under a great deal of stress but is managing on Sertraline. Continue, prescription given

## 2021-10-08 NOTE — Assessment & Plan Note (Signed)
Patient encouraged to maintain heart healthy diet, regular exercise, adequate sleep. Consider daily probiotics. Take medications as prescribed. Labs ordered and reviewed. Pap 11/22, MGM 8/22. Repeat both in next 2 years. She agrees to cologuard that is ordered today

## 2021-10-08 NOTE — Progress Notes (Signed)
Subjective:    Patient ID: Pamela Solis, female    DOB: [birth year], 61 y.o.   MRN: [id number]  Chief Complaint  Patient presents with   Annual Exam    HPI Patient is in today for annual preventative exam and follow up on chronic medical concerns. She is struggling with sore throat, fatigue, congestion, cough . No recent febrile illness or hospitalization. She is still working and struggling with stress but manages with her medications. She reports overall she tries to take care of herself hydrate, eat well and get her rest. Denies CP/palp/SOB/HA/fevers/GI or GU c/o. Taking meds as prescribed   Past Medical History:  Diagnosis Date   Acute bronchitis 01/21/2016   Allergic state 01/31/2017   Allergy    Anemia    Asthma    Cervical cancer screening 06/25/2015   Chronic kidney disease    Depression    Edema 11/24/2014   Hypertension    Morbid obesity ([place])    Obesity    Preventative health care 07/31/2016   Snoring 11/24/2014   Thoracic outlet syndrome 11/24/2014    Past Surgical History:  Procedure Laterality Date   CESAREAN SECTION     ENDOMETRIAL ABLATION  2011   TUBAL LIGATION      Family History  Problem Relation Age of Onset   Obesity Mother    Arthritis Mother        rheumatoid   Stroke Father     Social History   Socioeconomic History   Marital status: Married    Spouse name: Not on file   Number of children: Not on file   Years of education: Not on file   Highest education level: Not on file  Occupational History   Not on file  Tobacco Use   Smoking status: Former    Types: Cigarettes   Smokeless tobacco: Never  Vaping Use   Vaping Use: Never used  Substance and Sexual Activity   Alcohol use: No   Drug use: No   Sexual activity: Yes    Comment: lives with husband and daughters, works for [employer] no dietary restrictions  Other Topics Concern   Not on file  Social History Narrative   Not on file   Social Determinants of Health   Financial  Resource Strain: Not on file  Food Insecurity: Not on file  Transportation Needs: Not on file  Physical Activity: Not on file  Stress: Not on file  Social Connections: Not on file  Intimate Partner Violence: Not on file    Outpatient Medications Prior to Visit  Medication Sig Dispense Refill   budesonide-formoterol (SYMBICORT) 80-4.5 MCG/ACT inhaler INHALE 2 PUFFS BY MOUTH INTO THE LUNGS 2 (TWO) TIMES DAILY. BEGIN USING THIS INHALER ONCE YOU HAVE COMPLETED THE ORAL PREDNISONE DOSING SCHE 10.2 g 0   COVID-19 mRNA bivalent vaccine, Pfizer, (PFIZER COVID-19 VAC BIVALENT) injection Inject into the muscle. 0.3 mL 0   COVID-19 mRNA vaccine, Pfizer, 30 MCG/0.3ML injection INJECT AS DIRECTED .3 mL 0   diclofenac Sodium (VOLTAREN) 1 % GEL Apply 4 g topically 4 (four) times daily. 150 g 2   furosemide (LASIX) 20 MG tablet Take 1-2 tablets (20-40 mg total) by mouth daily as needed for fluid or edema (weight gain>3 pounds in 24 hours). 60 tablet 3   ibuprofen (ADVIL,MOTRIN) 800 MG tablet Take 1 tablet (800 mg total) by mouth every 8 (eight) hours as needed. For migraine 270 tablet 0   megestrol (MEGACE) 40 MG tablet Take 1  tablet (40 mg total) by mouth daily. Can increase to two tablets daily in the event of heavy bleeding 60 tablet 5   meloxicam (MOBIC) 15 MG tablet Take 1 tablet (15 mg total) by mouth daily. 30 tablet 0   albuterol (VENTOLIN HFA) 108 (90 Base) MCG/ACT inhaler Inhale 2 puffs into the lungs every 6 (six) hours as needed. For wheezing 18 g 2   cetirizine (ZYRTEC) 10 MG tablet Take 1 tablet (10 mg total) by mouth daily. 100 tablet 3   montelukast (SINGULAIR) 10 MG tablet TAKE 1 TABLET (10 MG TOTAL) BY MOUTH DAILY. 90 tablet 0   sertraline (ZOLOFT) 100 MG tablet TAKE 1 TABLET (100 MG TOTAL) BY MOUTH DAILY. 30 tablet 3   triamterene-hydrochlorothiazide (MAXZIDE-25) 37.5-25 MG tablet TAKE 1 TABLET BY MOUTH EVERY MORNING. 90 tablet 0   SUMAtriptan (IMITREX) 50 MG tablet Take 1 tablet (50 mg  total) by mouth once for 1 dose. May repeat in 2 hours if headache persists or recurs. 10 tablet 1   No facility-administered medications prior to visit.    Allergies  Allergen Reactions   Penicillins Rash    Review of Systems  Constitutional:  Positive for malaise/fatigue. Negative for chills and fever.  HENT:  Positive for sore throat. Negative for congestion and hearing loss.   Eyes:  Negative for discharge.  Respiratory:  Positive for cough and sputum production. Negative for shortness of breath.   Cardiovascular:  Negative for chest pain, palpitations and leg swelling.  Gastrointestinal:  Negative for abdominal pain, blood in stool, constipation, diarrhea, heartburn, nausea and vomiting.  Genitourinary:  Negative for dysuria, frequency, hematuria and urgency.  Musculoskeletal:  Negative for back pain, falls and myalgias.  Skin:  Negative for rash.  Neurological:  Negative for dizziness, sensory change, loss of consciousness, weakness and headaches.  Endo/Heme/Allergies:  Negative for environmental allergies. Does not bruise/bleed easily.  Psychiatric/Behavioral:  Positive for depression. Negative for suicidal ideas. The patient is not nervous/anxious and does not have insomnia.       Objective:    Physical Exam Constitutional:      General: She is not in acute distress.    Appearance: She is well-developed. She is obese.  HENT:     Head: Normocephalic and atraumatic.  Eyes:     Conjunctiva/sclera: Conjunctivae normal.  Neck:     Thyroid: No thyromegaly.  Cardiovascular:     Rate and Rhythm: Normal rate and regular rhythm.     Heart sounds: Normal heart sounds. No murmur heard. Pulmonary:     Effort: Pulmonary effort is normal. No respiratory distress.     Breath sounds: Normal breath sounds.  Abdominal:     General: Bowel sounds are normal. There is no distension.     Palpations: Abdomen is soft. There is no mass.     Tenderness: There is no abdominal tenderness.   Musculoskeletal:     Cervical back: Neck supple.  Lymphadenopathy:     Cervical: No cervical adenopathy.  Skin:    General: Skin is warm and dry.  Neurological:     Mental Status: She is alert and oriented to person, place, and time.  Psychiatric:        Behavior: Behavior normal.    BP 122/68    Pulse (!) 108    Temp 97.8 F (36.6 C)    Resp 16    Ht 5' 5"  (1.651 m)    Wt (!) 329 lb 12.8 oz (149.6 kg)  SpO2 93%    BMI 54.88 kg/m  Wt Readings from Last 3 Encounters:  10/07/21 (!) 329 lb 12.8 oz (149.6 kg)  08/04/21 (!) 335 lb (152 kg)  07/28/21 (!) 335 lb (152 kg)    Diabetic Foot Exam - Simple   No data filed    Lab Results  Component Value Date   WBC 9.3 06/22/2021   HGB 13.8 06/22/2021   HCT 42.4 06/22/2021   PLT 380.0 06/22/2021   GLUCOSE 101 (H) 06/22/2021   CHOL 146 06/22/2021   TRIG 76.0 06/22/2021   HDL 51.20 06/22/2021   LDLCALC 80 06/22/2021   ALT 23 06/22/2021   AST 27 06/22/2021   NA 136 06/22/2021   K 4.1 06/22/2021   CL 96 06/22/2021   CREATININE 0.83 06/22/2021   BUN 13 06/22/2021   CO2 31 06/22/2021   TSH 1.79 06/22/2021   HGBA1C 5.8 06/22/2021    Lab Results  Component Value Date   TSH 1.79 06/22/2021   Lab Results  Component Value Date   WBC 9.3 06/22/2021   HGB 13.8 06/22/2021   HCT 42.4 06/22/2021   MCV 90.1 06/22/2021   PLT 380.0 06/22/2021   Lab Results  Component Value Date   NA 136 06/22/2021   K 4.1 06/22/2021   CO2 31 06/22/2021   GLUCOSE 101 (H) 06/22/2021   BUN 13 06/22/2021   CREATININE 0.83 06/22/2021   BILITOT 0.9 06/22/2021   ALKPHOS 100 06/22/2021   AST 27 06/22/2021   ALT 23 06/22/2021   PROT 7.5 06/22/2021   ALBUMIN 3.9 06/22/2021   CALCIUM 9.0 06/22/2021   GFR 77.93 06/22/2021   Lab Results  Component Value Date   CHOL 146 06/22/2021   Lab Results  Component Value Date   HDL 51.20 06/22/2021   Lab Results  Component Value Date   LDLCALC 80 06/22/2021   Lab Results  Component Value Date    TRIG 76.0 06/22/2021   Lab Results  Component Value Date   CHOLHDL 3 06/22/2021   Lab Results  Component Value Date   HGBA1C 5.8 06/22/2021       Assessment & Plan:   Problem List Items Addressed This Visit     Asthma exacerbation    With pharyngitis as well. COVID will be checked and if negative she will be treated with Azithromycin, Mucinex and probiotics, tessalon perles      Relevant Medications   albuterol (VENTOLIN HFA) 108 (90 Base) MCG/ACT inhaler   HTN (hypertension)    Well controlled, no changes to meds. Encouraged heart healthy diet such as the DASH diet and exercise as tolerated.       Relevant Orders   CBC with Differential/Platelet   Comprehensive metabolic panel   TSH   Lipid panel   Depression    Is under a great deal of stress but is managing on Sertraline. Continue, prescription given      Relevant Medications   sertraline (ZOLOFT) 100 MG tablet   Obesity    Encouraged DASH or MIND diet, decrease po intake and increase exercise as tolerated. Needs 7-8 hours of sleep nightly. Avoid trans fats, eat small, frequent meals every 4-5 hours with lean proteins, complex carbs and healthy fats. Minimize simple carbs, high fat foods and processed foods consider Wegovy or Saxenda      Preventative health care    Patient encouraged to maintain heart healthy diet, regular exercise, adequate sleep. Consider daily probiotics. Take medications as prescribed. Labs ordered and  reviewed. Pap 11/22, MGM 8/22. Repeat both in next 2 years. She agrees to cologuard that is ordered today      Hyperglycemia    hgba1c acceptable, minimize simple carbs. Increase exercise as tolerated.       Relevant Orders   Hemoglobin A1c   Hyperlipidemia - Primary    Encourage heart healthy diet such as MIND or DASH diet, increase exercise, avoid trans fats, simple carbohydrates and processed foods, consider a krill or fish or flaxseed oil cap daily.       Relevant Orders   CBC with  Differential/Platelet   Comprehensive metabolic panel   TSH   Lipid panel   Post-menopausal bleeding    Is following with gynecology and being maintained on Megace       I have discontinued Pamela Barth. Solis azithromycin. I have also changed her albuterol and azithromycin. Additionally, I am having her start on benzonatate and Carestart COVID-19 Home Test. Lastly, I am having her maintain her ibuprofen, budesonide-formoterol, diclofenac Sodium, COVID-19 mRNA vaccine (Pfizer), meloxicam, SUMAtriptan, furosemide, megestrol, Pfizer COVID-19 Vac Bivalent, sertraline, and cetirizine.  Meds ordered this encounter  Medications   benzonatate (TESSALON) 100 MG capsule    Sig: Take 1 capsule (100 mg total) by mouth 2 (two) times daily as needed for cough.    Dispense:  20 capsule    Refill:  0   albuterol (VENTOLIN HFA) 108 (90 Base) MCG/ACT inhaler    Sig: Inhale 2 puffs into the lungs every 6 (six) hours as needed for wheezing    Dispense:  18 g    Refill:  2   DISCONTD: azithromycin (ZITHROMAX) 500 MG tablet    Sig: Take 1 tablet (500 mg total) by mouth daily for 5 days.    Dispense:  5 tablet    Refill:  0   sertraline (ZOLOFT) 100 MG tablet    Sig: Take 1 tablet (100 mg total) by mouth daily.    Dispense:  90 tablet    Refill:  1   cetirizine (ZYRTEC) 10 MG tablet    Sig: Take 1 tablet (10 mg total) by mouth daily.    Dispense:  100 tablet    Refill:  3   COVID-19 At Home Antigen Test (CARESTART COVID-19 HOME TEST) KIT    Sig: Use as directed per package instructions    Dispense:  4 each    Refill:  1   azithromycin (ZITHROMAX) 250 MG tablet    Sig: Take 2 tablets (500 mg total) by mouth daily for 5 days.    Dispense:  10 tablet    Refill:  0     Penni Homans, MD

## 2021-10-08 NOTE — Assessment & Plan Note (Signed)
Encouraged DASH or MIND diet, decrease po intake and increase exercise as tolerated. Needs 7-8 hours of sleep nightly. Avoid trans fats, eat small, frequent meals every 4-5 hours with lean proteins, complex carbs and healthy fats. Minimize simple carbs, high fat foods and processed foods consider Wegovy or Saxenda °

## 2021-10-08 NOTE — Telephone Encounter (Signed)
Paperwork placed in yellow bin °

## 2021-10-12 NOTE — Telephone Encounter (Signed)
Paperwork faxed °

## 2021-12-01 ENCOUNTER — Ambulatory Visit: Payer: 59 | Admitting: Obstetrics & Gynecology

## 2021-12-22 ENCOUNTER — Telehealth: Payer: Self-pay

## 2021-12-22 NOTE — Telephone Encounter (Signed)
Patient called and states that she has started bleeding heavy again. Patient states that her and Dr. Erin Fulling had discussed stopping her Megace so she did. And two weeks ago she starting bleeding again and this weekend she states she had "gushing". ? ?Reviewed with Dr. Erin Fulling and patient is to start the 40mg  of megace again and if she experiences any more episodes of gushing she can up it to twice daily. Patient is to return on April 19th for her follow up visit. April 21 RN  ?

## 2022-01-05 ENCOUNTER — Encounter: Payer: Self-pay | Admitting: General Practice

## 2022-01-12 ENCOUNTER — Encounter: Payer: Self-pay | Admitting: Obstetrics & Gynecology

## 2022-01-12 ENCOUNTER — Ambulatory Visit: Payer: 59 | Admitting: Obstetrics & Gynecology

## 2022-01-12 VITALS — BP 141/78 | HR 85

## 2022-01-12 DIAGNOSIS — N950 Postmenopausal bleeding: Secondary | ICD-10-CM | POA: Diagnosis not present

## 2022-01-12 NOTE — Progress Notes (Signed)
CLINIC ENCOUNTER NOTE ? ?History:  ?61 y.o. G2P1102 here today for PMPB. Pt reports that she has been bleeding daily in spite of Megace. She is s/p endo bx 07/2021 which was neg for malignancy. She had an ablation >10 years prev.  Pt wants to know what her options are for definitive management.   ? ?Past Medical History:  ?Diagnosis Date  ? Acute bronchitis 01/21/2016  ? Allergic state 01/31/2017  ? Allergy   ? Anemia   ? Asthma   ? Cervical cancer screening 06/25/2015  ? Chronic kidney disease   ? Depression   ? Edema 11/24/2014  ? Hypertension   ? Morbid obesity ([place])   ? Obesity   ? Preventative health care 07/31/2016  ? Snoring 11/24/2014  ? Thoracic outlet syndrome 11/24/2014  ? ? ?Past Surgical History:  ?Procedure Laterality Date  ? CESAREAN SECTION    ? ENDOMETRIAL ABLATION  2011  ? TUBAL LIGATION    ? ? ?The following portions of the patient's history were reviewed and updated as appropriate: allergies, current medications, past family history, past medical history, past social history, past surgical history and problem list.  ? ?Health Maintenance:  Normal pap and negative HRHPV on 07/2019.  Normal mammogram on 05/11/2021.  ? ?Review of Systems:  ?Pertinent items are noted in HPI. ?Comprehensive review of systems was otherwise negative. ?  ?Objective:  ?Physical Exam ?BP (!) 141/78   Pulse 85  ?CONSTITUTIONAL: Well-developed, well-nourished female in no acute distress.  ?HENT:  Normocephalic, atraumatic, External right and left ear normal. Oropharynx is clear and moist ?EYES: Conjunctivae and EOM are normal. Pupils are equal, round, and reactive to light. No scleral icterus.  ?NECK: Normal range of motion, supple, no masses ?SKIN: Skin is warm and dry. No rash noted. Not diaphoretic. No erythema. No pallor. ?[place]: Alert and oriented to person, place, and time. Normal reflexes, muscle tone coordination. No cranial nerve deficit noted. ?PSYCHIATRIC: Normal mood and affect. Normal behavior. Normal  judgment and thought content. ?CARDIOVASCULAR: Normal heart rate noted ?RESPIRATORY: Effort and breath sounds normal, no problems with respiration noted ?ABDOMEN: Soft, no distention noted.  No tenderness, rebound or guarding.  ?PELVIC: Deferred ?MUSCULOSKELETAL: Normal range of motion. No edema and no tenderness. ? ?Labs and Imaging ?07/30/2021 ?CLINICAL DATA:  Postmenopausal bleeding, morbid obesity, ablation 10 ?years ago ?  ?EXAM: ?TRANSABDOMINAL AND TRANSVAGINAL ULTRASOUND OF PELVIS ?  ?TECHNIQUE: ?Both transabdominal and transvaginal ultrasound examinations of the ?pelvis were performed. Transabdominal technique was performed for ?global imaging of the pelvis including uterus, ovaries, adnexal ?regions, and pelvic cul-de-sac. It was necessary to proceed with ?endovaginal exam following the transabdominal exam to visualize the ?uterus, endometrium, and ovaries. ?  ?COMPARISON:  None ?  ?FINDINGS: ?Uterus ?  ?Measurements: 9.0 x 5.6 x 6.4 cm = volume: 169 mL. Anteverted. ?Poorly visualized due to body habitus and shadowing from bowel. ?Nabothian cysts at cervix. Suspected mass at upper uterine segment ?though this is not well visualized, question 3.3 x 2.5 x 3.2 cm ?submucosal. No additional masses. ?  ?Endometrium ?  ?Thickness: 5 mm.  No endometrial fluid or mass ?  ?Right ovary ?  ?Not visualized, likely obscured by bowel ?  ?Left ovary ?  ?Not visualized, likely obscured by bowel ?  ?Other findings ?  ?No free pelvic fluid.  No adnexal masses. ?  ?IMPRESSION: ?Nonvisualization of ovaries. ?  ?Suspected submucosal leiomyoma at posterior upper uterus 3.3 cm ?diameter. ?  ?5 mm endometrial complex; in the setting  of post-menopausal ?bleeding, endometrial sampling is indicated to exclude carcinoma. If ?results are benign, sonohysterogram should be considered for focal ?lesion work-up. (Ref: Radiological Reasoning: Algorithmic Workup of ?Abnormal Vaginal Bleeding with Endovaginal Sonography and ?Sonohysterography.  AJR [id number]) ?  ?These results will be called to the ordering clinician or ?representative by the Radiologist Assistant, and communication ?documented in the PACS or [employer]. ?  ?  ? ?Assessment & Plan:  ?PMPB-  ?S/p ablation >10 years prev. Endo bx neg.  ? ?Patient desires surgical management with hysteroscopy with D&C and possible polypectomy vs myomectomy. Possible endometrial ablation.The risks of surgery were discussed in detail with the patient including but not limited to: bleeding which may require transfusion or reoperation; infection which may require prolonged hospitalization or re-hospitalization and antibiotic therapy; injury to bowel, bladder, ureters and major vessels or other surrounding organs; need for additional procedures including laparotomy; thromboembolic phenomenon, incisional problems and other postoperative or anesthesia complications.  Patient was told that the likelihood that her condition and symptoms will be treated effectively with this surgical management was very high; the postoperative expectations were also discussed in detail. The patient also understands the alternative treatment options which were discussed in full. All questions were answered.  She was told that she will be contacted by our surgical scheduler regarding the time and date of her surgery; routine preoperative instructions of having nothing to eat or drink after midnight on the day prior to surgery and also coming to the hospital 1 1/2 hours prior to her time of surgery were also emphasized.  She was told she may be called for a preoperative appointment about a week prior to surgery and will be given further preoperative instructions at that visit. Printed patient education handouts about the procedure were given to the patient to review at home.  ? ? ?Total face-to-face time with patient: 30 minutes. Over 50% of encounter was spent on counseling and coordination of care. ? ? ?Jazzelle Zhang L.  Ihor Dow, MD, FACOG ?Attending [place] for [employer], [place]  ?

## 2022-01-12 NOTE — Patient Instructions (Signed)
Hysteroscopy ?Hysteroscopy is a procedure used to look inside a woman's womb (uterus). This may be done for various reasons, including: ?To look for tumors and other growths in the uterus. ?To evaluate abnormal bleeding, fibroid tumors, polyps, scar tissue, or uterine cancer. ?To determine why a woman is unable to get pregnant or has had repeated pregnancy losses. ?To locate an IUD (intrauterine device). ?To place a birth control device into the fallopian tubes. ?During this procedure, a thin, flexible tube with a small light and camera (hysteroscope) is used to examine the uterus. The camera sends images to a monitor in the room so that your health care provider can view the inside of your uterus. A hysteroscopy should be done right after a menstrual period. ?Tell a health care provider about: ?Any allergies you have. ?All medicines you are taking, including vitamins, herbs, eye drops, creams, and over-the-counter medicines. ?Any problems you or family members have had with anesthetic medicines. ?Any blood disorders you have. ?Any surgeries you have had. ?Any medical conditions you have. ?Whether you are pregnant or may be pregnant. ?Whether you have been diagnosed with an STI (sexually transmitted infection) or you think you have an STI. ?What are the risks? ?Generally, this is a safe procedure. However, problems may occur, including: ?Excessive bleeding. ?Infection. ?Damage to the uterus or other structures or organs. ?Allergic reaction to medicines or fluids that are used in the procedure. ?What happens before the procedure? ?Staying hydrated ?Follow instructions from your health care provider about hydration, which may include: ?Up to 2 hours before the procedure - you may continue to drink clear liquids, such as water, clear fruit juice, black coffee, and plain tea. ?Eating and drinking restrictions ?Follow instructions from your health care provider about eating and drinking, which may include: ?8 hours  before the procedure - stop eating solid foods and drink clear liquids only. ?2 hours before the procedure - stop drinking clear liquids. ?Medicines ?Ask your health care provider about: ?Changing or stopping your regular medicines. This is especially important if you are taking diabetes medicines or blood thinners. ?Taking medicines such as aspirin and ibuprofen. These medicines can thin your blood. Do not take these medicines unless your health care provider tells you to take them. ?Taking over-the-counter medicines, vitamins, herbs, and supplements. ?Medicine may be placed in your cervix the day before the procedure. This medicine causes the cervix to open (dilate). The larger opening makes it easier for the hysteroscope to be inserted into the uterus during the procedure. ?General instructions ?Ask your health care provider: ?What steps will be taken to help prevent infection. These steps may include: ?Washing skin with a germ-killing soap. ?Taking antibiotic medicine. ?Do not use any products that contain nicotine or tobacco for at least 4 weeks before the procedure. These products include cigarettes, chewing tobacco, and vaping devices, such as e-cigarettes. If you need help quitting, ask your health care provider. ?Plan to have a responsible adult take you home from the hospital or clinic. ?Plan to have a responsible adult care for you for the time you are told after you leave the hospital or clinic. This is important. ?Empty your bladder before the procedure begins. ?What happens during the procedure? ?An IV will be inserted into one of your veins. ?You may be given: ?A medicine to help you relax (sedative). ?A medicine that numbs the area around the cervix (local anesthetic). ?A medicine to make you fall asleep (general anesthetic). ?A hysteroscope will be inserted through your vagina   and into your uterus. ?Air or fluid will be used to enlarge your uterus to allow your health care provider to see it better.  The amount of fluid used will be carefully checked throughout the procedure. ?In some cases, tissue may be gently scraped from inside the uterus and sent to a lab for testing (biopsy). ?The procedure may vary among health care providers and hospitals. ?What can I expect after the procedure? ?Your blood pressure, heart rate, breathing rate, and blood oxygen level will be monitored until you leave the hospital or clinic. ?You may have cramps. You may be given medicines for this. ?You may have bleeding, which may vary from light spotting to menstrual-like bleeding. This is normal. ?If you had a biopsy, it is up to you to get the results. Ask your health care provider, or the department that is doing the procedure, when your results will be ready. ?Follow these instructions at home: ?Activity ?Rest as told by your health care provider. ?Return to your normal activities as told by your health care provider. Ask your health care provider what activities are safe for you. ?If you were given a sedative during the procedure, it can affect you for several hours. Do not drive or operate machinery until your health care provider says that it is safe. ?Medicines ?Do not take aspirin or other NSAIDs during recovery, as told by your healthcare provider. It can increase the risk of bleeding. ?Ask your health care provider if the medicine prescribed to you: ?Requires you to avoid driving or using machinery. ?Can cause constipation. You may need to take these actions to prevent or treat constipation: ?Drink enough fluid to keep your urine pale yellow. ?Take over-the-counter or prescription medicines. ?Eat foods that are high in fiber, such as beans, whole grains, and fresh fruits and vegetables. ?Limit foods that are high in fat and processed sugars, such as fried or sweet foods. ?General instructions ?Do not douche, use tampons, or have sex for 2 weeks after the procedure, or until your health care provider approves. ?Do not take  baths, swim, or use a hot tub until your health care provider approves. Take showers instead of baths for 2 weeks, or for as long as told by your health care provider. ?Keep all follow-up visits. This is important. ?Contact a health care provider if: ?You feel dizzy or lightheaded. ?You feel nauseous. ?You have abnormal vaginal discharge. ?You have a rash. ?You have pain that does not get better with medicine. ?You have chills. ?Get help right away if: ?You have bleeding that is heavier than a normal menstrual period. ?You have a fever. ?You have pain or cramps that get worse. ?You develop new abdominal pain. ?You faint. ?You have pain in your shoulder. ?You are short of breath. ?Summary ?Hysteroscopy is a procedure that is used to look inside a woman's womb (uterus). ?After the procedure, you may have bleeding, which varies from light spotting to menstrual-like bleeding. This is normal. You may also have cramps. ?Do not douche, use tampons, or have sex for 2 weeks after the procedure, or until your health care provider approves. ?Plan to have a responsible adult take you home from the hospital or clinic. ?This information is not intended to replace advice given to you by your health care provider. Make sure you discuss any questions you have with your health care provider. ?Document Revised: 08/09/2021 Document Reviewed: 04/29/2020 ?Elsevier Patient Education ? 2023 Elsevier Inc. ? ?

## 2022-01-12 NOTE — Progress Notes (Signed)
Patient restarted Megace 40 mg once daily due to heavy bleeding on March 29th. Patient states she is still bleeding but not as heavy- but still filling multiple pads a day.  ?

## 2022-01-25 ENCOUNTER — Ambulatory Visit: Payer: 59 | Admitting: Family Medicine

## 2022-02-01 ENCOUNTER — Telehealth: Payer: Self-pay

## 2022-02-01 ENCOUNTER — Ambulatory Visit: Payer: 59 | Admitting: Family Medicine

## 2022-02-01 ENCOUNTER — Other Ambulatory Visit (HOSPITAL_BASED_OUTPATIENT_CLINIC_OR_DEPARTMENT_OTHER): Payer: Self-pay

## 2022-02-01 ENCOUNTER — Encounter: Payer: Self-pay | Admitting: Family Medicine

## 2022-02-01 VITALS — BP 116/47 | HR 98 | Ht 65.0 in | Wt 337.4 lb

## 2022-02-01 DIAGNOSIS — E669 Obesity, unspecified: Secondary | ICD-10-CM

## 2022-02-01 DIAGNOSIS — I10 Essential (primary) hypertension: Secondary | ICD-10-CM | POA: Diagnosis not present

## 2022-02-01 DIAGNOSIS — R739 Hyperglycemia, unspecified: Secondary | ICD-10-CM | POA: Diagnosis not present

## 2022-02-01 DIAGNOSIS — F32A Depression, unspecified: Secondary | ICD-10-CM

## 2022-02-01 DIAGNOSIS — R609 Edema, unspecified: Secondary | ICD-10-CM | POA: Diagnosis not present

## 2022-02-01 DIAGNOSIS — E785 Hyperlipidemia, unspecified: Secondary | ICD-10-CM

## 2022-02-01 MED ORDER — WEGOVY 0.25 MG/0.5ML ~~LOC~~ SOAJ
0.2500 mg | SUBCUTANEOUS | 3 refills | Status: DC
  Filled 2022-02-01: qty 2, 28d supply, fill #0

## 2022-02-01 NOTE — Assessment & Plan Note (Signed)
Blood pressure is at goal for age and co-morbidities.  I recommend continuing Maxid 37.5-25 mg daily and PRN lasix.   ?- BP goal <130/80 ?- monitor and log blood pressures at home ?- check around the same time each day in a relaxed setting ?- Limit salt to <2000 mg/day ?- Follow DASH eating plan (heart healthy diet) ?- limit alcohol to 2 standard drinks per day for men and 1 per day for women ?- avoid tobacco products ?- get at least 2 hours of regular aerobic exercise weekly ?Patient aware of signs/symptoms requiring further/urgent evaluation. ?Labs ordered - states she wants to come back fasting later this week or next  ? ?

## 2022-02-01 NOTE — Assessment & Plan Note (Signed)
-  Reviewed most recent lipid panel ?-Medication management: continue lifestyle measures for now ?-Repeat CMP and lipid panel  ?-Diet low in saturated fat ?-Regular exercise - at least 30 minutes, 5 times per week ? ?

## 2022-02-01 NOTE — Patient Instructions (Signed)
Please review the handout on Wegovy and let us know if you have any questions. We will see if insurance approves - this may take some time. ?Please schedule to come back to have your labs done in the next week or so.  ?Continue with heart healthy, low-carb diet and exercise as tolerated.  ?

## 2022-02-01 NOTE — Progress Notes (Signed)
? ?Established Patient Office Visit ? ?Subjective   ?Patient ID: Pamela Solis, female    DOB: 10-01-1960  Age: 61 y.o. MRN: [id number] ? ?CC: routine f/u, no new concerns  ? ? ?HPI ? ?Patient reports she has been doing really well. No new concerns.  ? ?HYPERTENSION: ?- Medications: Maxide 37.5-25 mg daily; PRN lasix (hasn't needed in several months) ?- Compliance: good ?- Checking BP at home: no ?- Denies any SOB, recurrent headaches, CP, vision changes, LE edema, dizziness, palpitations, or medication side effects. ?- Diet: regular ?- Exercise: none ? ? ?ASTHMA: ?- No recent flares, doing well  ?- Albuterol as needed, only uses Symbicort as needed - hasn't used in the past few weeks ? ?HYPERLIPIDEMIA ?- medications: lifestyle ?- compliance: n/a ?- medication SEs: n/a ?The 10-year ASCVD risk score (Arnett DK, et al., 2019) is: 2.4% ?  Values used to calculate the score: ?    Age: 82 years ?    Sex: Female ?    Is Non-Hispanic African American: No ?    Diabetic: No ?    Tobacco smoker: No ?    Systolic Blood Pressure: 99991111 mmHg ?    Is BP treated: Yes ?    HDL Cholesterol: 51.2 mg/dL ?    Total Cholesterol: 146 mg/dL ? ? ? ?DEPRESSION: ?- No SI/HI ?- Doing great on Zoloft 100 mg daily  ? ? ?HYPERGLYCEMIA/PRE-DM: ?- A1c 5.8% about 6 months ago ?- No polydipsia/polyuria/polyphagia ? ? ?OBESITY: ?- States she is having trouble losing weight despite portion control. At last visit she said PCP mentioned trying to get [facility] or [facility] approved. She would like to proceed with this.  ? ? ? ? ? ?ROS ?All review of systems negative except what is listed in the HPI ? ? ?  ?Objective:  ?  ? ?BP (!) 116/47   Pulse 98   Ht 5\' 5"  (1.651 m)   Wt (!) 337 lb 6.4 oz (153 kg)   BMI 56.15 kg/m?  ? ? ?Physical Exam ?Vitals reviewed.  ?Constitutional:   ?   General: She is not in acute distress. ?   Appearance: Normal appearance. She is obese. She is not ill-appearing.  ?Cardiovascular:  ?   Rate and Rhythm: Normal rate and regular  rhythm.  ?Pulmonary:  ?   Effort: Pulmonary effort is normal.  ?   Breath sounds: Normal breath sounds.  ?Musculoskeletal:  ?   Comments: Mild BLE non-pitting edema  ?Skin: ?   General: Skin is warm and dry.  ?   Findings: No erythema.  ?Neurological:  ?   General: No focal deficit present.  ?   Mental Status: She is alert and oriented to person, place, and time. Mental status is at baseline.  ?Psychiatric:     ?   Mood and Affect: Mood normal.     ?   Behavior: Behavior normal.     ?   Thought Content: Thought content normal.     ?   Judgment: Judgment normal.  ? ? ? ?No results found for any visits on 02/01/22. ? ? ? ?The 10-year ASCVD risk score (Arnett DK, et al., 2019) is: 2.4% ? ?  ?Assessment & Plan:  ? ?Problem List Items Addressed This Visit   ? ?  ? Cardiovascular and Mediastinum  ? HTN (hypertension)  ?  Blood pressure is at goal for age and co-morbidities.  I recommend continuing Maxid 37.5-25 mg daily and PRN lasix.   ?-  BP goal <130/80 ?- monitor and log blood pressures at home ?- check around the same time each day in a relaxed setting ?- Limit salt to <2000 mg/day ?- Follow DASH eating plan (heart healthy diet) ?- limit alcohol to 2 standard drinks per day for men and 1 per day for women ?- avoid tobacco products ?- get at least 2 hours of regular aerobic exercise weekly ?Patient aware of signs/symptoms requiring further/urgent evaluation. ?Labs ordered - states she wants to come back fasting later this week or next  ? ?  ?  ? Relevant Orders  ? Comprehensive metabolic panel  ? Lipid panel  ? TSH  ?  ? Other  ? Depression  ?  Stable on Zoloft 100 mg daily. No changes today. No SI/HI. Continue lifestyle/stress management.  ? ?  ?  ? Obesity  ?  Continue focusing on healthy diet (DASH/MIND) and exercise as tolerated. Labs ordered. She would like to go ahead and try [facility] - medication discussed and education provided. We will see if insurance approves.  ? ?  ?  ? Relevant Medications  ?  Semaglutide-Weight Management (WEGOVY) 0.25 MG/0.5ML SOAJ  ? Other Relevant Orders  ? Hemoglobin A1c  ? CBC  ? Comprehensive metabolic panel  ? Lipid panel  ? TSH  ? Edema  ?  Stable. She has not noticed any recent edema or weight gain. Mild non-pitting edema today, but reports she has been working all day. Continue PRN lasix and compression socks/elevation.  ? ?  ?  ? Hyperglycemia  ?  Stable 6 months ago. Ordering A1c. Asymptomatic. Continue healthy diet (carb modified) and exercise as tolerated.  ? ?  ?  ? Relevant Orders  ? Hemoglobin A1c  ? Comprehensive metabolic panel  ? Hyperlipidemia - Primary  ?  -Reviewed most recent lipid panel ?-Medication management: continue lifestyle measures for now ?-Repeat CMP and lipid panel  ?-Diet low in saturated fat ?-Regular exercise - at least 30 minutes, 5 times per week ? ?  ?  ? Relevant Orders  ? Comprehensive metabolic panel  ? Lipid panel  ? ? ?Return in about 3 months (around 05/04/2022) for routine f/u PCP.  ? ? ?Terrilyn Saver, NP ? ?

## 2022-02-01 NOTE — Assessment & Plan Note (Signed)
Stable. She has not noticed any recent edema or weight gain. Mild non-pitting edema today, but reports she has been working all day. Continue PRN lasix and compression socks/elevation.  ?

## 2022-02-01 NOTE — Assessment & Plan Note (Signed)
Stable on Zoloft 100 mg daily. No changes today. No SI/HI. Continue lifestyle/stress management.  ?

## 2022-02-01 NOTE — Telephone Encounter (Signed)
Received a message from Dr. Erin Fulling that she had a family emergency on the first week of June and would need to reschedule patients.  ? ?Called patient to notify her that we would need to reschedule her surgery on 6/6, no answer left voicemail explaining the situation and Dr. Danne Harbor next available surgery date. Left office callback number on voicemail for her to call back and verify receipt of this message. ?

## 2022-02-01 NOTE — Assessment & Plan Note (Signed)
Stable 6 months ago. Ordering A1c. Asymptomatic. Continue healthy diet (carb modified) and exercise as tolerated.  ?

## 2022-02-01 NOTE — Assessment & Plan Note (Signed)
Continue focusing on healthy diet (DASH/MIND) and exercise as tolerated. Labs ordered. She would like to go ahead and try [facility] - medication discussed and education provided. We will see if insurance approves.  ?

## 2022-02-03 ENCOUNTER — Other Ambulatory Visit (HOSPITAL_BASED_OUTPATIENT_CLINIC_OR_DEPARTMENT_OTHER): Payer: Self-pay

## 2022-02-08 ENCOUNTER — Other Ambulatory Visit (INDEPENDENT_AMBULATORY_CARE_PROVIDER_SITE_OTHER): Payer: 59

## 2022-02-08 DIAGNOSIS — E785 Hyperlipidemia, unspecified: Secondary | ICD-10-CM | POA: Diagnosis not present

## 2022-02-08 DIAGNOSIS — I10 Essential (primary) hypertension: Secondary | ICD-10-CM | POA: Diagnosis not present

## 2022-02-08 DIAGNOSIS — R739 Hyperglycemia, unspecified: Secondary | ICD-10-CM

## 2022-02-08 DIAGNOSIS — E669 Obesity, unspecified: Secondary | ICD-10-CM

## 2022-02-08 LAB — CBC
HCT: 43 % (ref 36.0–46.0)
Hemoglobin: 13.9 g/dL (ref 12.0–15.0)
MCHC: 32.3 g/dL (ref 30.0–36.0)
MCV: 87.7 fl (ref 78.0–100.0)
Platelets: 357 10*3/uL (ref 150.0–400.0)
RBC: 4.9 Mil/uL (ref 3.87–5.11)
RDW: 12.9 % (ref 11.5–15.5)
WBC: 7.8 10*3/uL (ref 4.0–10.5)

## 2022-02-08 LAB — COMPREHENSIVE METABOLIC PANEL
ALT: 16 U/L (ref 0–35)
AST: 19 U/L (ref 0–37)
Albumin: 3.7 g/dL (ref 3.5–5.2)
Alkaline Phosphatase: 89 U/L (ref 39–117)
BUN: 11 mg/dL (ref 6–23)
CO2: 29 mEq/L (ref 19–32)
Calcium: 8.7 mg/dL (ref 8.4–10.5)
Chloride: 97 mEq/L (ref 96–112)
Creatinine, Ser: 0.77 mg/dL (ref 0.40–1.20)
GFR: 84.89 mL/min (ref >60.00)
Glucose, Bld: 86 mg/dL (ref 70–99)
Potassium: 3.8 mEq/L (ref 3.5–5.1)
Sodium: 136 mEq/L (ref 135–145)
Total Bilirubin: 0.5 mg/dL (ref 0.2–1.2)
Total Protein: 7.7 g/dL (ref 6.0–8.3)

## 2022-02-08 LAB — HEMOGLOBIN A1C: Hgb A1c MFr Bld: 6.1 % (ref 4.6–6.5)

## 2022-02-08 LAB — LIPID PANEL
Cholesterol: 116 mg/dL (ref 0–200)
HDL: 35.2 mg/dL — ABNORMAL LOW (ref >39.00)
LDL Cholesterol: 65 mg/dL (ref 0–99)
NonHDL: 81.25
Total CHOL/HDL Ratio: 3
Triglycerides: 79 mg/dL (ref 0.0–149.0)
VLDL: 15.8 mg/dL (ref 0.0–40.0)

## 2022-02-08 LAB — TSH: TSH: 1.77 u[IU]/mL (ref 0.35–5.50)

## 2022-02-09 DIAGNOSIS — H5203 Hypermetropia, bilateral: Secondary | ICD-10-CM | POA: Diagnosis not present

## 2022-02-09 DIAGNOSIS — H52223 Regular astigmatism, bilateral: Secondary | ICD-10-CM | POA: Diagnosis not present

## 2022-02-09 DIAGNOSIS — H524 Presbyopia: Secondary | ICD-10-CM | POA: Diagnosis not present

## 2022-02-10 ENCOUNTER — Encounter: Payer: Self-pay | Admitting: *Deleted

## 2022-02-10 ENCOUNTER — Ambulatory Visit: Payer: 59

## 2022-02-10 ENCOUNTER — Other Ambulatory Visit (HOSPITAL_BASED_OUTPATIENT_CLINIC_OR_DEPARTMENT_OTHER): Payer: Self-pay

## 2022-02-10 NOTE — Progress Notes (Signed)
Pt is here for injection education. Pt will be taking Wegovy. Pt supplied his/her own [facility] pen. Demonstrated and walked through proper injection technique with patient using a teaching pen.     Pt verbalized understanding and explained back to me injection instructions. Pt administered her first injection in the abdomen , tolerated well. Pt now feels comfortable self administering Wegovy.

## 2022-02-15 ENCOUNTER — Encounter: Payer: Self-pay | Admitting: General Practice

## 2022-02-16 ENCOUNTER — Ambulatory Visit: Payer: 59 | Admitting: Family Medicine

## 2022-02-25 ENCOUNTER — Other Ambulatory Visit (HOSPITAL_BASED_OUTPATIENT_CLINIC_OR_DEPARTMENT_OTHER): Payer: Self-pay

## 2022-02-25 ENCOUNTER — Other Ambulatory Visit: Payer: Self-pay | Admitting: Family Medicine

## 2022-02-25 DIAGNOSIS — E669 Obesity, unspecified: Secondary | ICD-10-CM

## 2022-02-25 NOTE — Telephone Encounter (Signed)
Pharmacy comment: Requesting 0.5mg  dose

## 2022-02-28 ENCOUNTER — Other Ambulatory Visit: Payer: Self-pay | Admitting: Family Medicine

## 2022-02-28 ENCOUNTER — Other Ambulatory Visit (HOSPITAL_BASED_OUTPATIENT_CLINIC_OR_DEPARTMENT_OTHER): Payer: Self-pay

## 2022-02-28 MED ORDER — WEGOVY 0.25 MG/0.5ML ~~LOC~~ SOAJ
0.5000 mg | SUBCUTANEOUS | 1 refills | Status: AC
  Filled 2022-02-28: qty 2, 28d supply, fill #0

## 2022-02-28 MED ORDER — SEMAGLUTIDE-WEIGHT MANAGEMENT 0.5 MG/0.5ML ~~LOC~~ SOAJ
0.5000 mg | SUBCUTANEOUS | 1 refills | Status: AC
  Filled 2022-02-28: qty 2, 28d supply, fill #0
  Filled 2022-03-25: qty 2, 28d supply, fill #1

## 2022-02-28 NOTE — Telephone Encounter (Signed)
Called pt, pt needed 0.5 dose

## 2022-02-28 NOTE — Addendum Note (Signed)
Addended by: Gonzella Lex R on: 02/28/2022 02:46 PM   Modules accepted: Orders

## 2022-03-01 ENCOUNTER — Other Ambulatory Visit (HOSPITAL_BASED_OUTPATIENT_CLINIC_OR_DEPARTMENT_OTHER): Payer: Self-pay

## 2022-03-02 ENCOUNTER — Other Ambulatory Visit (HOSPITAL_BASED_OUTPATIENT_CLINIC_OR_DEPARTMENT_OTHER): Payer: Self-pay

## 2022-03-16 ENCOUNTER — Encounter: Payer: 59 | Admitting: Obstetrics & Gynecology

## 2022-03-25 ENCOUNTER — Other Ambulatory Visit (HOSPITAL_BASED_OUTPATIENT_CLINIC_OR_DEPARTMENT_OTHER): Payer: Self-pay

## 2022-03-25 ENCOUNTER — Other Ambulatory Visit: Payer: Self-pay | Admitting: Family Medicine

## 2022-03-25 MED ORDER — MONTELUKAST SODIUM 10 MG PO TABS
10.0000 mg | ORAL_TABLET | Freq: Every day | ORAL | 1 refills | Status: AC
Start: 2022-03-25 — End: ?
  Filled 2022-03-25: qty 90, 90d supply, fill #0

## 2022-04-13 ENCOUNTER — Encounter: Payer: 59 | Admitting: Obstetrics & Gynecology

## 2022-04-26 ENCOUNTER — Encounter (HOSPITAL_COMMUNITY): Admission: RE | Payer: Self-pay | Source: Home / Self Care

## 2022-04-26 ENCOUNTER — Ambulatory Visit (HOSPITAL_COMMUNITY): Admission: RE | Admit: 2022-04-26 | Payer: 59 | Source: Home / Self Care | Admitting: Obstetrics & Gynecology

## 2022-04-26 SURGERY — DILATATION & CURETTAGE/HYSTEROSCOPY WITH NOVASURE ABLATION
Anesthesia: Choice

## 2022-05-11 ENCOUNTER — Encounter: Payer: 59 | Admitting: Obstetrics & Gynecology

## 2022-05-17 ENCOUNTER — Ambulatory Visit: Payer: Self-pay | Admitting: Family Medicine

## 2022-06-08 ENCOUNTER — Encounter: Payer: Self-pay | Admitting: General Practice

## 2023-02-06 ENCOUNTER — Telehealth: Payer: Self-pay | Admitting: Family Medicine

## 2023-02-06 NOTE — Telephone Encounter (Signed)
Pt requesting a copy of her immunization records. Please call when ready for pt to pick up

## 2023-02-07 NOTE — Telephone Encounter (Signed)
Pt notified immunization record is ready for pickup.  Placed up front.

## 2023-07-18 ENCOUNTER — Other Ambulatory Visit (HOSPITAL_BASED_OUTPATIENT_CLINIC_OR_DEPARTMENT_OTHER): Payer: Self-pay

## 2023-07-18 MED ORDER — INFLUENZA VIRUS VACC SPLIT PF (FLUZONE) 0.5 ML IM SUSY
0.5000 mL | PREFILLED_SYRINGE | Freq: Once | INTRAMUSCULAR | 0 refills | Status: AC
  Filled 2023-07-18: qty 0.5, 1d supply, fill #0

## 2023-12-11 DIAGNOSIS — H524 Presbyopia: Secondary | ICD-10-CM | POA: Diagnosis not present

## 2023-12-11 DIAGNOSIS — Z0100 Encounter for examination of eyes and vision without abnormal findings: Secondary | ICD-10-CM | POA: Diagnosis not present

## 2024-05-21 ENCOUNTER — Other Ambulatory Visit: Payer: Self-pay | Admitting: Family Medicine

## 2024-05-21 ENCOUNTER — Ambulatory Visit: Payer: Self-pay | Admitting: Family Medicine

## 2024-05-21 ENCOUNTER — Other Ambulatory Visit (HOSPITAL_BASED_OUTPATIENT_CLINIC_OR_DEPARTMENT_OTHER): Payer: Self-pay

## 2024-05-21 MED ORDER — FUROSEMIDE 20 MG PO TABS
20.0000 mg | ORAL_TABLET | Freq: Every day | ORAL | 2 refills | Status: AC | PRN
  Filled 2024-05-21: qty 60, 30d supply, fill #0

## 2024-05-21 MED ORDER — TRIAMTERENE-HCTZ 37.5-25 MG PO TABS
1.0000 | ORAL_TABLET | Freq: Every morning | ORAL | 0 refills | Status: DC
  Filled 2024-05-21: qty 90, 90d supply, fill #0

## 2024-05-21 NOTE — Telephone Encounter (Signed)
 This RN called pt but she had spoken with someone already per chart. The two medications pt needs refilled are:   triamterene -hydrochlorothiazide  (MAXZIDE -25) 37.5-25 MG tablet, furosemide  (LASIX ) 20 MG tablet   Pharmacy:  [facility] [place] [facility] [address], [address] [place] [address] Phone: [phone] Fax: [phone]       Copied from CRM [id number]. Topic: Clinical - Medication Question >> May 21, 2024  8:58 AM Frederich PARAS wrote: Reason for CRM: pt requesting a short supply of medication to hold her over until her apt in October.

## 2024-05-21 NOTE — Telephone Encounter (Signed)
 Copied from CRM #[id number]. Topic: Clinical - Medication Refill >> May 21, 2024  8:50 AM Frederich PARAS wrote: Medication: triamterene -hydrochlorothiazide  (MAXZIDE -25) 37.5-25 MG tablet, furosemide  (LASIX ) 20 MG tablet  Has the patient contacted their pharmacy? No pt have an apt coming up to talk about the medication but its too far out due to the availability. Pt needs meds to hold her over for the apt.    This is the patient's preferred pharmacy:  [facility] [place] [facility] [address] [place] [address] Phone: [phone] Fax: [phone]  Is this the correct pharmacy for this prescription? Yes If no, delete pharmacy and type the correct one.   Has the prescription been filled recently? No  Is the patient out of the medication? Yes  Has the patient been seen for an appointment in the last year OR does the patient have an upcoming appointment? Yes  Can we respond through MyChart? No  Agent: Please be advised that Rx refills may take up to 3 business days. We ask that you follow-up with your pharmacy.

## 2024-05-24 ENCOUNTER — Other Ambulatory Visit (HOSPITAL_BASED_OUTPATIENT_CLINIC_OR_DEPARTMENT_OTHER): Payer: Self-pay

## 2024-07-05 ENCOUNTER — Other Ambulatory Visit (HOSPITAL_BASED_OUTPATIENT_CLINIC_OR_DEPARTMENT_OTHER): Payer: Self-pay

## 2024-07-05 MED ORDER — FLUZONE 0.5 ML IM SUSY
0.5000 mL | PREFILLED_SYRINGE | Freq: Once | INTRAMUSCULAR | 0 refills | Status: AC
  Filled 2024-07-05: qty 0.5, 1d supply, fill #0

## 2024-07-09 DIAGNOSIS — H40003 Preglaucoma, unspecified, bilateral: Secondary | ICD-10-CM | POA: Diagnosis not present

## 2024-07-09 DIAGNOSIS — H40011 Open angle with borderline findings, low risk, right eye: Secondary | ICD-10-CM | POA: Diagnosis not present

## 2024-07-22 ENCOUNTER — Ambulatory Visit: Payer: Self-pay | Admitting: Family Medicine

## 2024-07-22 ENCOUNTER — Other Ambulatory Visit: Payer: Self-pay

## 2024-07-23 ENCOUNTER — Ambulatory Visit: Payer: Self-pay | Admitting: Family Medicine

## 2024-07-23 ENCOUNTER — Ambulatory Visit: Admitting: Student

## 2024-07-23 ENCOUNTER — Encounter: Payer: Self-pay | Admitting: Student

## 2024-07-23 ENCOUNTER — Other Ambulatory Visit (HOSPITAL_BASED_OUTPATIENT_CLINIC_OR_DEPARTMENT_OTHER): Payer: Self-pay

## 2024-07-23 VITALS — BP 164/70 | HR 80 | Ht 65.0 in | Wt 341.6 lb

## 2024-07-23 DIAGNOSIS — R7303 Prediabetes: Secondary | ICD-10-CM | POA: Diagnosis not present

## 2024-07-23 DIAGNOSIS — R739 Hyperglycemia, unspecified: Secondary | ICD-10-CM | POA: Diagnosis not present

## 2024-07-23 DIAGNOSIS — Z6841 Body Mass Index (BMI) 40.0 and over, adult: Secondary | ICD-10-CM

## 2024-07-23 DIAGNOSIS — R0683 Snoring: Secondary | ICD-10-CM

## 2024-07-23 DIAGNOSIS — N950 Postmenopausal bleeding: Secondary | ICD-10-CM

## 2024-07-23 DIAGNOSIS — J45909 Unspecified asthma, uncomplicated: Secondary | ICD-10-CM | POA: Diagnosis not present

## 2024-07-23 DIAGNOSIS — G8929 Other chronic pain: Secondary | ICD-10-CM

## 2024-07-23 DIAGNOSIS — G43909 Migraine, unspecified, not intractable, without status migrainosus: Secondary | ICD-10-CM | POA: Diagnosis not present

## 2024-07-23 DIAGNOSIS — M25562 Pain in left knee: Secondary | ICD-10-CM

## 2024-07-23 DIAGNOSIS — M25561 Pain in right knee: Secondary | ICD-10-CM

## 2024-07-23 DIAGNOSIS — E785 Hyperlipidemia, unspecified: Secondary | ICD-10-CM

## 2024-07-23 DIAGNOSIS — I10 Essential (primary) hypertension: Secondary | ICD-10-CM

## 2024-07-23 MED ORDER — WEGOVY 0.25 MG/0.5ML ~~LOC~~ SOAJ
0.2500 mg | SUBCUTANEOUS | 3 refills | Status: AC
Start: 2024-07-23 — End: ?
  Filled 2024-07-23: qty 2, 28d supply, fill #0

## 2024-07-23 MED ORDER — TRIAMTERENE-HCTZ 37.5-25 MG PO TABS
1.0000 | ORAL_TABLET | Freq: Every morning | ORAL | 1 refills | Status: AC
  Filled 2024-07-23 – 2024-10-16 (×2): qty 90, 90d supply, fill #0

## 2024-07-23 MED ORDER — IMITREX 50 MG PO TABS
50.0000 mg | ORAL_TABLET | Freq: Once | ORAL | 1 refills | Status: DC
  Filled 2024-07-23: qty 9, 30d supply, fill #0

## 2024-07-23 MED ORDER — SUMATRIPTAN SUCCINATE 25 MG PO TABS
25.0000 mg | ORAL_TABLET | ORAL | 1 refills | Status: AC | PRN
  Filled 2024-07-23: qty 10, 6d supply, fill #0

## 2024-07-23 MED ORDER — CETIRIZINE HCL 10 MG PO TABS
10.0000 mg | ORAL_TABLET | Freq: Every day | ORAL | 3 refills | Status: AC
  Filled 2024-07-23: qty 100, 100d supply, fill #0

## 2024-07-23 MED ORDER — PREVNAR 20 0.5 ML IM SUSY
PREFILLED_SYRINGE | INTRAMUSCULAR | 0 refills | Status: DC
  Filled 2024-07-23: qty 0.5, 1d supply, fill #0

## 2024-07-23 NOTE — Progress Notes (Signed)
 Subjective:     Patient ID: Santana CHRISTELLA Dixons, female    DOB: August 06, 1961, 63 y.o.   MRN: [id number]  No chief complaint on file.   HPI  Discussed the use of AI scribe software for clinical note transcription with the patient, who gave verbal consent to proceed.  History of Present Illness      History of Present Illness KAHLIYAH DICK is a 63 year old female who presents for medication refills and evaluation of blood pressure management.  She experiences infrequent migraines, using Imitrex  less than once a month for severe episodes, and requests a refill. Her hypertension management is inconsistent due to being out of work for a year, and she lacks a home blood pressure monitor. She takes Lasix  as needed for ankle swelling, which occurs with prolonged sitting or standing at her job. She is interested in resuming Wegovy  for weight loss if insurance approves. She suspects sleep apnea due to snoring, daytime fatigue, and frequent nighttime awakenings. She experiences intermittent right knee pain, rated at 6-7/10 during flare-ups, managed with Tylenol  and occasional ibuprofen . She is prediabetic.    Follows with-optometry  Postmenopausal bleeding Pt reports she is no longer taking Megace . Pt reports fibroid was found and has since resolved, no c/o post menopausal bleeding.  HYPERTENSION: - Medications: Maxide 37.5-25 mg daily; PRN lasix   - Compliance: good - Checking BP at home: no  Wt Readings from Last 3 Encounters:  07/23/24 (!) 341 lb 9.6 oz (154.9 kg)  02/01/22 (!) 337 lb 6.4 oz (153 kg)  10/07/21 (!) 329 lb 12.8 oz (149.6 kg)    BP Readings from Last 3 Encounters:  07/23/24 (!) 164/70  02/01/22 (!) 116/47  01/12/22 (!) 141/78     HLD- Asthma Budesonide -formoterol  (Symbicort )-inhale 2 puffs twice daily; albuterol  as needed  HCM MGM- last 2022; due CRC screen: due Immunizations: PNA  Patient denies fever, chills, SOB, CP, palpitations, dyspnea, edema, HA, vision  changes, N/V/D, abdominal pain, urinary symptoms, rash, weight changes, and recent illness or hospitalizations.    Health Maintenance Due  Topic Date Due   Pneumococcal Vaccine: 50+ Years (1 of 2 - PCV) Never done   Colonoscopy  Never done   Mammogram  05/12/2023   COVID-19 Vaccine (5 - 2025-26 season) 05/27/2024    Past Medical History:  Diagnosis Date   Acute bronchitis 01/21/2016   Allergic state 01/31/2017   Allergy    Anemia    Anxiety 08/06/2014   Asthma    Cervical cancer screening 06/25/2015   Chronic kidney disease    Depression    Edema 11/24/2014   Hypertension    Morbid obesity (HCC)    Obesity    Preventative health care 07/31/2016   Seasonal allergies 08/06/2014   Snoring 11/24/2014   Thoracic outlet syndrome 11/24/2014    Past Surgical History:  Procedure Laterality Date   CESAREAN SECTION     ENDOMETRIAL ABLATION  2011   TUBAL LIGATION      Family History  Problem Relation Age of Onset   Obesity Mother    Arthritis Mother        rheumatoid   Stroke Father     Social History   Socioeconomic History   Marital status: Married    Spouse name: Not on file   Number of children: Not on file   Years of education: Not on file   Highest education level: Not on file  Occupational History   Not on file  Tobacco Use  Smoking status: Former    Types: Cigarettes   Smokeless tobacco: Never  Vaping Use   Vaping status: Never Used  Substance and Sexual Activity   Alcohol use: No   Drug use: No   Sexual activity: Yes    Comment: lives with husband and daughters, works for [employer] no dietary restrictions  Other Topics Concern   Not on file  Social History Narrative   Not on file   Social Drivers of Health   Financial Resource Strain: Not on file  Food Insecurity: Not on file  Transportation Needs: Not on file  Physical Activity: Not on file  Stress: Not on file  Social Connections: Not on file  Intimate Partner Violence: Not on file     Outpatient Medications Prior to Visit  Medication Sig Dispense Refill   albuterol  (VENTOLIN  HFA) 108 (90 Base) MCG/ACT inhaler Inhale 2 puffs into the lungs every 6 (six) hours as needed for wheezing 18 g 2   budesonide -formoterol  (SYMBICORT ) 80-4.5 MCG/ACT inhaler INHALE 2 PUFFS BY MOUTH INTO THE LUNGS 2 (TWO) TIMES DAILY. BEGIN USING THIS INHALER ONCE YOU HAVE COMPLETED THE ORAL PREDNISONE  DOSING SCHE 10.2 g 0   COVID-19 At Home Antigen Test (CARESTART COVID-19 HOME TEST) KIT Use as directed per package instructions 4 each 1   diclofenac  Sodium (VOLTAREN ) 1 % GEL Apply 4 g topically 4 (four) times daily. 150 g 2   furosemide  (LASIX ) 20 MG tablet Take 1-2 tablets (20-40 mg total) by mouth daily as needed for fluid or edema (weight gain>3 pounds in 24 hours). 60 tablet 2   ibuprofen  (ADVIL ,MOTRIN ) 800 MG tablet Take 1 tablet (800 mg total) by mouth every 8 (eight) hours as needed. For migraine 270 tablet 0   megestrol  (MEGACE ) 40 MG tablet Take 1 tablet (40 mg total) by mouth daily. Can increase to two tablets daily in the event of heavy bleeding 60 tablet 5   meloxicam  (MOBIC ) 15 MG tablet Take 1 tablet (15 mg total) by mouth daily. 30 tablet 0   montelukast  (SINGULAIR ) 10 MG tablet Take 1 tablet (10 mg total) by mouth daily. 90 tablet 1   sertraline  (ZOLOFT ) 100 MG tablet Take 1 tablet (100 mg total) by mouth daily. 90 tablet 1   cetirizine  (ZYRTEC ) 10 MG tablet Take 1 tablet (10 mg total) by mouth daily. 100 tablet 3   escitalopram  (LEXAPRO ) 20 MG tablet Take 1 tablet (20 mg total) by mouth daily. 90 tablet 1   SUMAtriptan  (IMITREX ) 50 MG tablet Take 1 tablet (50 mg total) by mouth once for 1 dose. May repeat in 2 hours if headache persists or recurs. 10 tablet 1   triamterene -hydrochlorothiazide  (MAXZIDE -25) 37.5-25 MG tablet Take 1 tablet by mouth every morning. 90 tablet 0   No facility-administered medications prior to visit.    Allergies  Allergen Reactions   Penicillins Rash     ROS See HPI    Objective:    Physical Exam  General: No acute distress. Awake and conversant. +obese Eyes: Normal conjunctiva, anicteric. Round symmetric pupils.  Respiratory: CTAB. Respirations are non-labored. No wheezing.  Skin: Warm. No rashes or ulcers.  Psych: Alert and oriented. Cooperative, Appropriate mood and affect, Normal judgment.  CV: RRR. No murmur. No lower extremity edema.  MSK: Normal ambulation. No clubbing or cyanosis.  Neuro:  CN II-XII grossly normal.    BP (!) 164/70   Pulse 80   Ht 5' 5 (1.651 m)   Wt (!) 341 lb 9.6 oz (  154.9 kg)   SpO2 97%   BMI 56.85 kg/m  Wt Readings from Last 3 Encounters:  07/23/24 (!) 341 lb 9.6 oz (154.9 kg)  02/01/22 (!) 337 lb 6.4 oz (153 kg)  10/07/21 (!) 329 lb 12.8 oz (149.6 kg)       Assessment & Plan:   Problem List Items Addressed This Visit     HTN (hypertension) - Primary   Poorly controlled. Pt has not been taking medication regularly. Advise taking medications as prescribed, refills ordered. Encourage Pt to monitor at home. Encouraged heart healthy diet such as the DASH diet and exercise as tolerated.   Target BP > 130/80 mmHg, RTC if above 140/90 mmHg.      Relevant Medications   triamterene -hydrochlorothiazide  (MAXZIDE -25) 37.5-25 MG tablet   Other Relevant Orders   CBC with Differential/Platelet   TSH   Hyperglycemia   hgba1c acceptable, minimize simple carbs. Increase exercise as tolerated.       Relevant Orders   Comprehensive metabolic panel with GFR   HgB J8r   Hyperlipidemia   Encourage heart healthy diet such as MIND or DASH diet, increase exercise, avoid trans fats, simple carbohydrates and processed foods, consider a krill or fish or flaxseed oil cap daily.        Relevant Medications   triamterene -hydrochlorothiazide  (MAXZIDE -25) 37.5-25 MG tablet   Other Relevant Orders   Lipid panel   Mild asthma without complication   Denies recent exacerbations. Well controlled on current  medications.      Morbid obesity with BMI of 50.0-59.9, adult (HCC)   Weight loss benefits for hypertension, knee pain, discussed. Medication and common side effects reviewed with the patient; patient voiced understanding and had no further questions at this time. Weight loss goal is 1-2 pounds per week. - Attempt to get insurance approval for Wegovy . - Start Wegovy  at 0.25 mg weekly for four weeks, then increase to 0.5 mg weekly, and finally to 1 mg weekly.       Relevant Medications   semaglutide -weight management (WEGOVY ) 0.25 MG/0.5ML SOAJ SQ injection   Post-menopausal bleeding   Is following with gynecology and being maintained on Megace         Relevant Orders   CBC with Differential/Platelet   Prediabetes   Previous lab results indicate prediabetes. Importance of monitoring A1c levels discussed. - Order A1c test to assess current glycemic control.      Relevant Orders   HgB A1c   Snoring   Restless sleep, fatigue, hypertension and snoring, referred to pulmonology for consideration of sleep study         Relevant Orders   Ambulatory referral to Pulmonology   Other Visit Diagnoses       Acute pain of left knee         Chronic pain of right knee           Right knee pain Intermittent pain severity 6-7/10. Non-pharmacological management options discussed. - Recommend using orthotic shoes with good support and bracing the knee when active. - Advise using ice and elevation for pain management. - Suggest taking Tylenol  as needed, and ibuprofen  for severe pain, pending kidney function results.     I have discontinued Santana HERO. Rundle's escitalopram . I have also changed her SUMAtriptan  to Imitrex . Additionally, I am having her start on Wegovy . Lastly, I am having her maintain her ibuprofen , budesonide -formoterol , diclofenac  Sodium, meloxicam , megestrol , albuterol , sertraline , Carestart COVID-19 Home Test, montelukast , furosemide , triamterene -hydrochlorothiazide , and  cetirizine .  Meds ordered this encounter  Medications   triamterene -hydrochlorothiazide  (MAXZIDE -25) 37.5-25 MG tablet    Sig: Take 1 tablet by mouth every morning.    Dispense:  90 tablet    Refill:  1   cetirizine  (ZYRTEC ) 10 MG tablet    Sig: Take 1 tablet (10 mg total) by mouth daily.    Dispense:  100 tablet    Refill:  3    package size   IMITREX  50 MG tablet    Sig: Take 1 tablet (50 mg total) by mouth once for 1 dose. May repeat in 2 hours if headache persists or recurs.    Dispense:  10 tablet    Refill:  1    Supervising Provider:   DOMENICA BLACKBIRD A [[id number]]   semaglutide -weight management (WEGOVY ) 0.25 MG/0.5ML SOAJ SQ injection    Sig: Inject 0.25 mg into the skin once a week. After 4 weeks, can increase to 0.5 mg weekly for 4 weeks, then 1 mg weekly for 4 weeks    Dispense:  2 mL    Refill:  3    Supervising Provider:   DOMENICA BLACKBIRD A [[id number]]

## 2024-07-23 NOTE — Assessment & Plan Note (Signed)
 Encourage heart healthy diet such as MIND or DASH diet, increase exercise, avoid trans fats, simple carbohydrates and processed foods, consider a krill or fish or flaxseed oil cap daily.

## 2024-07-23 NOTE — Assessment & Plan Note (Signed)
 Previous lab results indicate prediabetes. Importance of monitoring A1c levels discussed. - Order A1c test to assess current glycemic control.

## 2024-07-23 NOTE — Assessment & Plan Note (Signed)
Is following with gynecology and being maintained on Megace

## 2024-07-23 NOTE — Assessment & Plan Note (Signed)
Restless sleep, fatigue, hypertension and snoring, referred to pulmonology for consideration of sleep study

## 2024-07-23 NOTE — Assessment & Plan Note (Addendum)
 Weight loss benefits for hypertension, knee pain, discussed. Medication and common side effects reviewed with the patient; patient voiced understanding and had no further questions at this time. Weight loss goal is 1-2 pounds per week. - Attempt to get insurance approval for Wegovy . - Start Wegovy  at 0.25 mg weekly for four weeks, then increase to 0.5 mg weekly, and finally to 1 mg weekly.

## 2024-07-23 NOTE — Assessment & Plan Note (Signed)
 Denies recent exacerbations. Well controlled on current medications.

## 2024-07-23 NOTE — Assessment & Plan Note (Signed)
 hgba1c acceptable, minimize simple carbs. Increase exercise as tolerated.

## 2024-07-23 NOTE — Assessment & Plan Note (Addendum)
 Poorly controlled. Pt has not been taking medication regularly. Advise taking medications as prescribed, refills ordered. Encourage Pt to monitor at home. Encouraged heart healthy diet such as the DASH diet and exercise as tolerated.   Target BP > 130/80 mmHg, RTC if above 140/90 mmHg.

## 2024-07-23 NOTE — Addendum Note (Signed)
 Addended by: WHEELER HARLENE CROME on: 07/23/2024 05:01 PM   Modules accepted: Orders

## 2024-07-24 ENCOUNTER — Ambulatory Visit: Payer: Self-pay | Admitting: Student

## 2024-07-24 LAB — TSH: TSH: 0.87 u[IU]/mL (ref 0.35–5.50)

## 2024-07-24 LAB — COMPREHENSIVE METABOLIC PANEL WITH GFR
ALT: 15 U/L (ref 0–35)
AST: 18 U/L (ref 0–37)
Albumin: 3.8 g/dL (ref 3.5–5.2)
Alkaline Phosphatase: 88 U/L (ref 39–117)
BUN: 12 mg/dL (ref 6–23)
CO2: 29 meq/L (ref 19–32)
Calcium: 9 mg/dL (ref 8.4–10.5)
Chloride: 103 meq/L (ref 96–112)
Creatinine, Ser: 0.75 mg/dL (ref 0.40–1.20)
GFR: 86.12 mL/min (ref >60.00)
Glucose, Bld: 85 mg/dL (ref 70–99)
Potassium: 4.8 meq/L (ref 3.5–5.1)
Sodium: 139 meq/L (ref 135–145)
Total Bilirubin: 0.6 mg/dL (ref 0.2–1.2)
Total Protein: 7.4 g/dL (ref 6.0–8.3)

## 2024-07-24 LAB — CBC WITH DIFFERENTIAL/PLATELET
Basophils Absolute: 0.1 K/uL (ref 0.0–0.1)
Basophils Relative: 0.6 % (ref 0.0–3.0)
Eosinophils Absolute: 0.5 K/uL (ref 0.0–0.7)
Eosinophils Relative: 5.9 % — ABNORMAL HIGH (ref 0.0–5.0)
HCT: 42.7 % (ref 36.0–46.0)
Hemoglobin: 14 g/dL (ref 12.0–15.0)
Lymphocytes Relative: 22.1 % (ref 12.0–46.0)
Lymphs Abs: 1.8 K/uL (ref 0.7–4.0)
MCHC: 32.9 g/dL (ref 30.0–36.0)
MCV: 90.2 fl (ref 78.0–100.0)
Monocytes Absolute: 0.6 K/uL (ref 0.1–1.0)
Monocytes Relative: 7.6 % (ref 3.0–12.0)
Neutro Abs: 5.2 K/uL (ref 1.4–7.7)
Neutrophils Relative %: 63.8 % (ref 43.0–77.0)
Platelets: 307 K/uL (ref 150.0–400.0)
RBC: 4.73 Mil/uL (ref 3.87–5.11)
RDW: 13 % (ref 11.5–15.5)
WBC: 8.2 K/uL (ref 4.0–10.5)

## 2024-07-24 LAB — LIPID PANEL
Cholesterol: 157 mg/dL (ref 0–200)
HDL: 61.8 mg/dL (ref >39.00)
LDL Cholesterol: 82 mg/dL (ref 0–99)
NonHDL: 94.7
Total CHOL/HDL Ratio: 3
Triglycerides: 65 mg/dL (ref 0.0–149.0)
VLDL: 13 mg/dL (ref 0.0–40.0)

## 2024-07-24 LAB — HEMOGLOBIN A1C: Hgb A1c MFr Bld: 5.8 % (ref 4.6–6.5)

## 2024-08-14 ENCOUNTER — Ambulatory Visit: Admitting: Nurse Practitioner

## 2024-09-04 NOTE — Assessment & Plan Note (Signed)
 Well controlled, no changes to meds. Encouraged heart healthy diet such as the DASH diet and exercise as tolerated.

## 2024-09-04 NOTE — Assessment & Plan Note (Addendum)
 Patient encouraged to maintain heart healthy diet, regular exercise, adequate sleep. Consider daily probiotics. Take medications as prescribed. Labs ordered and reviewed.  Pap 07/2021 repeat by 2027  [facility] 8/22 repeat due.  Needs colon cancer screening, had cologuard ordered in past but was not returned.  Encouraged Dexa scan

## 2024-09-04 NOTE — Progress Notes (Unsigned)
 Subjective:    Patient ID: Pamela Solis, female    DOB: 12-08-1960, 63 y.o.   MRN: [id number]  No chief complaint on file.   HPI Discussed the use of AI scribe software for clinical note transcription with the patient, who gave verbal consent to proceed.  History of Present Illness Pamela Solis is a 63 year old female who presents with a head cold and nasal congestion.  Pamela Solis has been experiencing symptoms of a head cold since Thanksgiving, including dark green phlegm and intermittent voice changes. No sore throat, fever, headache, or ear pain. Nasal symptoms include congestion and light greenish nasal discharge.  Pamela Solis has been managing her symptoms with hydration, rest, Mucinex, and vitamin C. Pamela Solis inquires about the duration for which Pamela Solis can continue taking Mucinex, noting that it has been since Thanksgiving.  Her past medical history includes allergies, for which Pamela Solis takes Singulair  (montelukast ). Pamela Solis has a known penicillin allergy. Pamela Solis is not currently using her albuterol  inhaler or Symbicort .  Pamela Solis has received her pneumonia vaccine at age 25 and her flu vaccine recently.    Past Medical History:  Diagnosis Date   Acute bronchitis 01/21/2016   Allergic state 01/31/2017   Allergy    Anemia    Anxiety 08/06/2014   Asthma    Cervical cancer screening 06/25/2015   Chronic kidney disease    Depression    Edema 11/24/2014   Hypertension    Morbid obesity (HCC)    Obesity    Preventative health care 07/31/2016   Seasonal allergies 08/06/2014   Snoring 11/24/2014   Thoracic outlet syndrome 11/24/2014    Past Surgical History:  Procedure Laterality Date   CESAREAN SECTION     ENDOMETRIAL ABLATION  2011   TUBAL LIGATION      Family History  Problem Relation Age of Onset   Obesity Mother    Arthritis Mother        rheumatoid   Stroke Father     Social History   Socioeconomic History   Marital status: Married    Spouse name: Not on file   Number of children:  Not on file   Years of education: Not on file   Highest education level: Not on file  Occupational History   Not on file  Tobacco Use   Smoking status: Former    Types: Cigarettes   Smokeless tobacco: Never  Vaping Use   Vaping status: Never Used  Substance and Sexual Activity   Alcohol use: No   Drug use: No   Sexual activity: Yes    Comment: lives with husband and daughters, works for [employer] no dietary restrictions  Other Topics Concern   Not on file  Social History Narrative   Not on file   Social Drivers of Health   Financial Resource Strain: Not on file  Food Insecurity: Not on file  Transportation Needs: Not on file  Physical Activity: Not on file  Stress: Not on file  Social Connections: Not on file  Intimate Partner Violence: Not on file    Outpatient Medications Prior to Visit  Medication Sig Dispense Refill   albuterol  (VENTOLIN  HFA) 108 (90 Base) MCG/ACT inhaler Inhale 2 puffs into the lungs every 6 (six) hours as needed for wheezing 18 g 2   budesonide -formoterol  (SYMBICORT ) 80-4.5 MCG/ACT inhaler INHALE 2 PUFFS BY MOUTH INTO THE LUNGS 2 (TWO) TIMES DAILY. BEGIN USING THIS INHALER ONCE YOU HAVE COMPLETED THE ORAL PREDNISONE  DOSING SCHE 10.2 g 0  cetirizine  (ZYRTEC ) 10 MG tablet Take 1 tablet (10 mg total) by mouth daily. 100 tablet 3   COVID-19 At Home Antigen Test (CARESTART COVID-19 HOME TEST) KIT Use as directed per package instructions 4 each 1   diclofenac  Sodium (VOLTAREN ) 1 % GEL Apply 4 g topically 4 (four) times daily. 150 g 2   furosemide  (LASIX ) 20 MG tablet Take 1-2 tablets (20-40 mg total) by mouth daily as needed for fluid or edema (weight gain>3 pounds in 24 hours). 60 tablet 2   ibuprofen  (ADVIL ,MOTRIN ) 800 MG tablet Take 1 tablet (800 mg total) by mouth every 8 (eight) hours as needed. For migraine 270 tablet 0   megestrol  (MEGACE ) 40 MG tablet Take 1 tablet (40 mg total) by mouth daily. Can increase to two tablets daily in the event of heavy  bleeding 60 tablet 5   meloxicam  (MOBIC ) 15 MG tablet Take 1 tablet (15 mg total) by mouth daily. 30 tablet 0   montelukast  (SINGULAIR ) 10 MG tablet Take 1 tablet (10 mg total) by mouth daily. 90 tablet 1   pneumococcal 20-valent conjugate vaccine (PREVNAR 20 ) 0.5 ML injection Inject into the muscle. 0.5 mL 0   semaglutide -weight management (WEGOVY ) 0.25 MG/0.5ML SOAJ SQ injection Inject 0.25 mg into the skin once a week. After 4 weeks, can increase to 0.5 mg weekly for 4 weeks, then 1 mg weekly for 4 weeks 2 mL 3   sertraline  (ZOLOFT ) 100 MG tablet Take 1 tablet (100 mg total) by mouth daily. 90 tablet 1   SUMAtriptan  (IMITREX ) 25 MG tablet Take 1 tablet (25 mg total) by mouth every 2 (two) hours as needed for migraine. May repeat in 2 hours if headache persists or recurs. 10 tablet 1   triamterene -hydrochlorothiazide  (MAXZIDE -25) 37.5-25 MG tablet Take 1 tablet by mouth every morning. 90 tablet 1   No facility-administered medications prior to visit.    Allergies  Allergen Reactions   Penicillins Rash    Review of Systems  Constitutional:  Negative for chills, fever and malaise/fatigue.  HENT:  Positive for congestion and sinus pain. Negative for hearing loss.   Eyes:  Negative for discharge.  Respiratory:  Positive for cough and sputum production. Negative for shortness of breath.   Cardiovascular:  Negative for chest pain, palpitations and leg swelling.  Gastrointestinal:  Negative for abdominal pain, blood in stool, constipation, diarrhea, heartburn, nausea and vomiting.  Genitourinary:  Negative for dysuria, frequency, hematuria and urgency.  Musculoskeletal:  Positive for back pain. Negative for falls and myalgias.  Skin:  Negative for rash.  Neurological:  Negative for dizziness, sensory change, loss of consciousness, weakness and headaches.  Endo/Heme/Allergies:  Negative for environmental allergies. Does not bruise/bleed easily.  Psychiatric/Behavioral:  Negative for depression  and suicidal ideas. The patient is not nervous/anxious and does not have insomnia.        Objective:    Physical Exam Constitutional:      General: Pamela Solis is not in acute distress.    Appearance: Normal appearance. Pamela Solis is not diaphoretic.  HENT:     Head: Normocephalic and atraumatic.     Right Ear: Tympanic membrane, ear canal and external ear normal.     Left Ear: Tympanic membrane, ear canal and external ear normal.     Nose: Nose normal.     Mouth/Throat:     Mouth: Mucous membranes are moist.     Pharynx: Oropharynx is clear. No oropharyngeal exudate.  Eyes:     General: No scleral icterus.  Right eye: No discharge.        Left eye: No discharge.     Conjunctiva/sclera: Conjunctivae normal.     Pupils: Pupils are equal, round, and reactive to light.  Neck:     Thyroid : No thyromegaly.  Cardiovascular:     Rate and Rhythm: Normal rate and regular rhythm.     Heart sounds: Normal heart sounds. No murmur heard. Pulmonary:     Effort: Pulmonary effort is normal. No respiratory distress.     Breath sounds: Normal breath sounds. No wheezing or rales.  Abdominal:     General: Bowel sounds are normal. There is no distension.     Palpations: Abdomen is soft. There is no mass.     Tenderness: There is no abdominal tenderness.  Musculoskeletal:        General: No tenderness. Normal range of motion.     Cervical back: Normal range of motion and neck supple.  Lymphadenopathy:     Cervical: No cervical adenopathy.  Skin:    General: Skin is warm and dry.     Findings: No rash.  Neurological:     General: No focal deficit present.     Mental Status: Pamela Solis is alert and oriented to person, place, and time.     Cranial Nerves: No cranial nerve deficit.     Coordination: Coordination normal.     Deep Tendon Reflexes: Reflexes are normal and symmetric. Reflexes normal.  Psychiatric:        Mood and Affect: Mood normal.        Behavior: Behavior normal.        Thought Content:  Thought content normal.        Judgment: Judgment normal.    There were no vitals taken for this visit. Wt Readings from Last 3 Encounters:  07/23/24 (!) 341 lb 9.6 oz (154.9 kg)  02/01/22 (!) 337 lb 6.4 oz (153 kg)  10/07/21 (!) 329 lb 12.8 oz (149.6 kg)    Diabetic Foot Exam - Simple   No data filed    Lab Results  Component Value Date   WBC 8.2 07/23/2024   HGB 14.0 07/23/2024   HCT 42.7 07/23/2024   PLT 307.0 07/23/2024   GLUCOSE 85 07/23/2024   CHOL 157 07/23/2024   TRIG 65.0 07/23/2024   HDL 61.80 07/23/2024   LDLCALC 82 07/23/2024   ALT 15 07/23/2024   AST 18 07/23/2024   NA 139 07/23/2024   K 4.8 07/23/2024   CL 103 07/23/2024   CREATININE 0.75 07/23/2024   BUN 12 07/23/2024   CO2 29 07/23/2024   TSH 0.87 07/23/2024   HGBA1C 5.8 07/23/2024    Lab Results  Component Value Date   TSH 0.87 07/23/2024   Lab Results  Component Value Date   WBC 8.2 07/23/2024   HGB 14.0 07/23/2024   HCT 42.7 07/23/2024   MCV 90.2 07/23/2024   PLT 307.0 07/23/2024   Lab Results  Component Value Date   NA 139 07/23/2024   K 4.8 07/23/2024   CO2 29 07/23/2024   GLUCOSE 85 07/23/2024   BUN 12 07/23/2024   CREATININE 0.75 07/23/2024   BILITOT 0.6 07/23/2024   ALKPHOS 88 07/23/2024   AST 18 07/23/2024   ALT 15 07/23/2024   PROT 7.4 07/23/2024   ALBUMIN 3.8 07/23/2024   CALCIUM 9.0 07/23/2024   GFR 86.12 07/23/2024   Lab Results  Component Value Date   CHOL 157 07/23/2024   Lab Results  Component  Value Date   HDL 61.80 07/23/2024   Lab Results  Component Value Date   LDLCALC 82 07/23/2024   Lab Results  Component Value Date   TRIG 65.0 07/23/2024   Lab Results  Component Value Date   CHOLHDL 3 07/23/2024   Lab Results  Component Value Date   HGBA1C 5.8 07/23/2024       Assessment & Plan:  Preventative health care Assessment & Plan: Patient encouraged to maintain heart healthy diet, regular exercise, adequate sleep. Consider daily probiotics.  Take medications as prescribed. Labs ordered and reviewed.  Pap 07/2021 repeat by 2027  [facility] 8/22 repeat due.  Needs colon cancer screening, had cologuard ordered in past but was not returned.  Encouraged Dexa scan   Primary hypertension Assessment & Plan: Well controlled, no changes to meds. Encouraged heart healthy diet such as the DASH diet and exercise as tolerated.    Hyperlipidemia, unspecified hyperlipidemia type  Hyperglycemia Assessment & Plan: hgba1c acceptable, minimize simple carbs. Increase exercise as tolerated.    Obesity, unspecified class, unspecified obesity type, unspecified whether serious comorbidity present Assessment & Plan: Encouraged DASH or MIND diet, decrease po intake and increase exercise as tolerated. Needs 7-8 hours of sleep nightly. Avoid trans fats, eat small, frequent meals every 4-5 hours with lean proteins, complex carbs and healthy fats. Minimize simple carbs, high fat foods and processed foods      Assessment and Plan Assessment & Plan Woman's Wellness Visit Routine wellness visit with emphasis on preventive care and screenings. Discussed mammogram, colonoscopy, and bone density scan. Cologuard as an alternative to colonoscopy every three years. Bone density scan recommended at age 69 due to early bone thinning. Discussed RSV and COVID vaccines for respiratory protection. Advanced directives and healthcare power of attorney discussed. - Ordered mammogram - Ordered Cologuard - Ordered bone density scan - Discussed RSV vaccine (Oryxvy) - Discussed COVID vaccine - Encouraged consideration of dermatology referral for skin check  Acute upper respiratory infection Symptoms include green phlegm, nasal congestion, and hoarseness since Thanksgiving. No fever, chills, or sore throat. Likely viral etiology, but bacterial infection considered. Discussed Z-Pak (azithromycin ) as a treatment option. Advised on Mucinex and probiotics to support recovery and  gut health. - Prescribed Z-Pak (azithromycin ) - Continue Mucinex for 10 days - Recommended probiotic (Doctor Blythe's probiotic) for one month  Morbid obesity Management discussed. Previous attempt with Wegovy  was unsuccessful due to insurance issues. Zepbound considered as an alternative, with potential cost reduction in January. Saxenda discussed as another option if cost-effective. - Monitor Zepbound pricing for potential initiation in January - Will consider Saxenda if cost-effective  Postmenopausal state with estrogen deficiency Postmenopausal state with estrogen deficiency. Discussed bone health and the importance of calcium and vitamin D supplementation. Bone density scan recommended to assess bone health. - Ordered bone density scan - Recommended calcium and vitamin D supplementation  Recording duration: 37 minutes     Harlene Horton, MD

## 2024-09-04 NOTE — Assessment & Plan Note (Deleted)
 Encourage heart healthy diet such as MIND or DASH diet, increase exercise, avoid trans fats, simple carbohydrates and processed foods, consider a krill or fish or flaxseed oil cap daily.

## 2024-09-04 NOTE — Assessment & Plan Note (Signed)
 hgba1c acceptable, minimize simple carbs. Increase exercise as tolerated.

## 2024-09-04 NOTE — Assessment & Plan Note (Signed)
 Encouraged DASH or MIND diet, decrease po intake and increase exercise as tolerated. Needs 7-8 hours of sleep nightly. Avoid trans fats, eat small, frequent meals every 4-5 hours with lean proteins, complex carbs and healthy fats. Minimize simple carbs, high fat foods and processed foods

## 2024-09-05 ENCOUNTER — Other Ambulatory Visit (HOSPITAL_BASED_OUTPATIENT_CLINIC_OR_DEPARTMENT_OTHER): Payer: Self-pay

## 2024-09-05 ENCOUNTER — Ambulatory Visit: Payer: Self-pay | Admitting: Family Medicine

## 2024-09-05 VITALS — BP 122/78 | HR 89 | Temp 97.5°F | Resp 16 | Ht 65.0 in | Wt 335.6 lb

## 2024-09-05 DIAGNOSIS — E2839 Other primary ovarian failure: Secondary | ICD-10-CM | POA: Diagnosis not present

## 2024-09-05 DIAGNOSIS — Z0000 Encounter for general adult medical examination without abnormal findings: Secondary | ICD-10-CM | POA: Diagnosis not present

## 2024-09-05 DIAGNOSIS — Z6841 Body Mass Index (BMI) 40.0 and over, adult: Secondary | ICD-10-CM | POA: Diagnosis not present

## 2024-09-05 DIAGNOSIS — Z1231 Encounter for screening mammogram for malignant neoplasm of breast: Secondary | ICD-10-CM | POA: Diagnosis not present

## 2024-09-05 DIAGNOSIS — R739 Hyperglycemia, unspecified: Secondary | ICD-10-CM | POA: Diagnosis not present

## 2024-09-05 DIAGNOSIS — E785 Hyperlipidemia, unspecified: Secondary | ICD-10-CM | POA: Diagnosis not present

## 2024-09-05 DIAGNOSIS — I10 Essential (primary) hypertension: Secondary | ICD-10-CM

## 2024-09-05 DIAGNOSIS — Z780 Asymptomatic menopausal state: Secondary | ICD-10-CM

## 2024-09-05 DIAGNOSIS — E669 Obesity, unspecified: Secondary | ICD-10-CM

## 2024-09-05 MED ORDER — MONTELUKAST SODIUM 10 MG PO TABS
10.0000 mg | ORAL_TABLET | Freq: Every day | ORAL | 3 refills | Status: AC
  Filled 2024-09-05: qty 90, 90d supply, fill #0

## 2024-09-05 MED ORDER — AZITHROMYCIN 250 MG PO TABS
ORAL_TABLET | ORAL | 0 refills | Status: AC
  Filled 2024-09-05: qty 6, 5d supply, fill #0

## 2024-09-05 NOTE — Patient Instructions (Addendum)
 NOW probiotic daily  RSV, Respiratory Virus Vaccine, Arexvy vaccinbe Covid annually  Preventive Care 2-61 Years Old, Female Preventive care refers to lifestyle choices and visits with your health care provider that can promote health and wellness. Preventive care visits are also called wellness exams. What can I expect for my preventive care visit? Counseling Your health care provider may ask you questions about your: Medical history, including: Past medical problems. Family medical history. Pregnancy history. Current health, including: Menstrual cycle. Method of birth control. Emotional well-being. Home life and relationship well-being. Sexual activity and sexual health. Lifestyle, including: Alcohol, nicotine or tobacco, and drug use. Access to firearms. Diet, exercise, and sleep habits. Work and work astronomer. Sunscreen use. Safety issues such as seatbelt and bike helmet use. Physical exam Your health care provider will check your: Height and weight. These may be used to calculate your BMI (body mass index). BMI is a measurement that tells if you are at a healthy weight. Waist circumference. This measures the distance around your waistline. This measurement also tells if you are at a healthy weight and may help predict your risk of certain diseases, such as type 2 diabetes and high blood pressure. Heart rate and blood pressure. Body temperature. Skin for abnormal spots. What immunizations do I need?  Vaccines are usually given at various ages, according to a schedule. Your health care provider will recommend vaccines for you based on your age, medical history, and lifestyle or other factors, such as travel or where you work. What tests do I need? Screening Your health care provider may recommend screening tests for certain conditions. This may include: Lipid and cholesterol levels. Diabetes screening. This is done by checking your blood sugar (glucose) after you have  not eaten for a while (fasting). Pelvic exam and Pap test. Hepatitis B test. Hepatitis C test. HIV (human immunodeficiency virus) test. STI (sexually transmitted infection) testing, if you are at risk. Lung cancer screening. Colorectal cancer screening. Mammogram. Talk with your health care provider about when you should start having regular mammograms. This may depend on whether you have a family history of breast cancer. BRCA-related cancer screening. This may be done if you have a family history of breast, ovarian, tubal, or peritoneal cancers. Bone density scan. This is done to screen for osteoporosis. Talk with your health care provider about your test results, treatment options, and if necessary, the need for more tests. Follow these instructions at home: Eating and drinking  Eat a diet that includes fresh fruits and vegetables, whole grains, lean protein, and low-fat dairy products. Take vitamin and mineral supplements as recommended by your health care provider. Do not drink alcohol if: Your health care provider tells you not to drink. You are pregnant, may be pregnant, or are planning to become pregnant. If you drink alcohol: Limit how much you have to 0-1 drink a day. Know how much alcohol is in your drink. In the [place] one drink equals one 12 oz bottle of beer (355 mL), one 5 oz glass of wine (148 mL), or one 1 oz glass of hard liquor (44 mL). Lifestyle Brush your teeth every morning and night with fluoride toothpaste. Floss one time each day. Exercise for at least 30 minutes 5 or more days each week. Do not use any products that contain nicotine or tobacco. These products include cigarettes, chewing tobacco, and vaping devices, such as e-cigarettes. If you need help quitting, ask your health care provider. Do not use drugs. If you are sexually  active, practice safe sex. Use a condom or other form of protection to prevent STIs. If you do not wish to become pregnant, use a  form of birth control. If you plan to become pregnant, see your health care provider for a prepregnancy visit. Take aspirin only as told by your health care provider. Make sure that you understand how much to take and what form to take. Work with your health care provider to find out whether it is safe and beneficial for you to take aspirin daily. Find healthy ways to manage stress, such as: Meditation, yoga, or listening to music. Journaling. Talking to a trusted person. Spending time with friends and family. Minimize exposure to UV radiation to reduce your risk of skin cancer. Safety Always wear your seat belt while driving or riding in a vehicle. Do not drive: If you have been drinking alcohol. Do not ride with someone who has been drinking. When you are tired or distracted. While texting. If you have been using any mind-altering substances or drugs. Wear a helmet and other protective equipment during sports activities. If you have firearms in your house, make sure you follow all gun safety procedures. Seek help if you have been physically or sexually abused. What's next? Visit your health care provider once a year for an annual wellness visit. Ask your health care provider how often you should have your eyes and teeth checked. Stay up to date on all vaccines. This information is not intended to replace advice given to you by your health care provider. Make sure you discuss any questions you have with your health care provider. Document Revised: 03/10/2021 Document Reviewed: 03/10/2021 Elsevier Patient Education  2024 [employer].

## 2024-09-08 ENCOUNTER — Encounter: Payer: Self-pay | Admitting: Family Medicine

## 2024-10-15 ENCOUNTER — Ambulatory Visit (HOSPITAL_BASED_OUTPATIENT_CLINIC_OR_DEPARTMENT_OTHER)

## 2024-10-15 ENCOUNTER — Other Ambulatory Visit (HOSPITAL_BASED_OUTPATIENT_CLINIC_OR_DEPARTMENT_OTHER)

## 2024-10-16 ENCOUNTER — Other Ambulatory Visit (HOSPITAL_BASED_OUTPATIENT_CLINIC_OR_DEPARTMENT_OTHER): Payer: Self-pay

## 2024-11-14 ENCOUNTER — Other Ambulatory Visit (HOSPITAL_BASED_OUTPATIENT_CLINIC_OR_DEPARTMENT_OTHER)

## 2024-11-14 ENCOUNTER — Ambulatory Visit (HOSPITAL_BASED_OUTPATIENT_CLINIC_OR_DEPARTMENT_OTHER)

## 2025-03-13 ENCOUNTER — Ambulatory Visit: Admitting: Family Medicine
